# Patient Record
Sex: Female | Born: 1955 | Race: White | Hispanic: No | Smoking: Never smoker
Health system: Southern US, Community
[De-identification: ages and names within clinical notes are randomized; demographics above are authoritative.]

## PROBLEM LIST (undated history)

## (undated) ENCOUNTER — Emergency Department (HOSPITAL_BASED_OUTPATIENT_CLINIC_OR_DEPARTMENT_OTHER): Payer: No Typology Code available for payment source | Source: Home / Self Care

## (undated) DIAGNOSIS — Z91030 Bee allergy status: Secondary | ICD-10-CM

## (undated) DIAGNOSIS — M8580 Other specified disorders of bone density and structure, unspecified site: Secondary | ICD-10-CM

## (undated) DIAGNOSIS — I499 Cardiac arrhythmia, unspecified: Secondary | ICD-10-CM

## (undated) DIAGNOSIS — Z8042 Family history of malignant neoplasm of prostate: Secondary | ICD-10-CM

## (undated) DIAGNOSIS — J189 Pneumonia, unspecified organism: Secondary | ICD-10-CM

## (undated) DIAGNOSIS — R011 Cardiac murmur, unspecified: Secondary | ICD-10-CM

## (undated) DIAGNOSIS — Z8679 Personal history of other diseases of the circulatory system: Secondary | ICD-10-CM

## (undated) DIAGNOSIS — I4892 Unspecified atrial flutter: Secondary | ICD-10-CM

## (undated) DIAGNOSIS — K2270 Barrett's esophagus without dysplasia: Secondary | ICD-10-CM

## (undated) DIAGNOSIS — I350 Nonrheumatic aortic (valve) stenosis: Secondary | ICD-10-CM

## (undated) DIAGNOSIS — Z808 Family history of malignant neoplasm of other organs or systems: Secondary | ICD-10-CM

## (undated) DIAGNOSIS — R0600 Dyspnea, unspecified: Secondary | ICD-10-CM

## (undated) DIAGNOSIS — M179 Osteoarthritis of knee, unspecified: Secondary | ICD-10-CM

## (undated) DIAGNOSIS — Z801 Family history of malignant neoplasm of trachea, bronchus and lung: Secondary | ICD-10-CM

## (undated) DIAGNOSIS — IMO0002 Reserved for concepts with insufficient information to code with codable children: Secondary | ICD-10-CM

## (undated) DIAGNOSIS — Z8481 Family history of carrier of genetic disease: Secondary | ICD-10-CM

## (undated) DIAGNOSIS — Z800 Family history of malignant neoplasm of digestive organs: Secondary | ICD-10-CM

## (undated) DIAGNOSIS — E785 Hyperlipidemia, unspecified: Secondary | ICD-10-CM

## (undated) DIAGNOSIS — Z9889 Other specified postprocedural states: Secondary | ICD-10-CM

## (undated) DIAGNOSIS — K219 Gastro-esophageal reflux disease without esophagitis: Secondary | ICD-10-CM

## (undated) DIAGNOSIS — K529 Noninfective gastroenteritis and colitis, unspecified: Secondary | ICD-10-CM

## (undated) DIAGNOSIS — I10 Essential (primary) hypertension: Secondary | ICD-10-CM

## (undated) DIAGNOSIS — Z8041 Family history of malignant neoplasm of ovary: Secondary | ICD-10-CM

## (undated) DIAGNOSIS — K5792 Diverticulitis of intestine, part unspecified, without perforation or abscess without bleeding: Secondary | ICD-10-CM

## (undated) DIAGNOSIS — E669 Obesity, unspecified: Secondary | ICD-10-CM

## (undated) DIAGNOSIS — G43909 Migraine, unspecified, not intractable, without status migrainosus: Secondary | ICD-10-CM

## (undated) DIAGNOSIS — Z87898 Personal history of other specified conditions: Secondary | ICD-10-CM

## (undated) DIAGNOSIS — K449 Diaphragmatic hernia without obstruction or gangrene: Secondary | ICD-10-CM

## (undated) DIAGNOSIS — M1710 Unilateral primary osteoarthritis, unspecified knee: Secondary | ICD-10-CM

## (undated) HISTORY — DX: Other specified disorders of bone density and structure, unspecified site: M85.80

## (undated) HISTORY — DX: Hyperlipidemia, unspecified: E78.5

## (undated) HISTORY — PX: ABDOMINAL HYSTERECTOMY: SHX81

## (undated) HISTORY — PX: BREAST BIOPSY: SHX20

## (undated) HISTORY — DX: Osteoarthritis of knee, unspecified: M17.9

## (undated) HISTORY — PX: CARDIAC CATHETERIZATION: SHX172

## (undated) HISTORY — DX: Unilateral primary osteoarthritis, unspecified knee: M17.10

## (undated) HISTORY — PX: CHOLECYSTECTOMY: SHX55

## (undated) HISTORY — DX: Obesity, unspecified: E66.9

## (undated) HISTORY — DX: Migraine, unspecified, not intractable, without status migrainosus: G43.909

## (undated) HISTORY — DX: Nonrheumatic aortic (valve) stenosis: I35.0

## (undated) HISTORY — PX: OTHER SURGICAL HISTORY: SHX169

## (undated) HISTORY — DX: Personal history of other specified conditions: Z87.898

## (undated) HISTORY — DX: Bee allergy status: Z91.030

---
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
## 2006-10-07 DIAGNOSIS — M778 Other enthesopathies, not elsewhere classified: Secondary | ICD-10-CM

## 2006-10-07 HISTORY — DX: Other enthesopathies, not elsewhere classified: M77.8

## 2008-01-06 DIAGNOSIS — I4892 Unspecified atrial flutter: Secondary | ICD-10-CM | POA: Insufficient documentation

## 2008-01-06 HISTORY — DX: Unspecified atrial flutter: I48.92

## 2009-03-15 ENCOUNTER — Encounter: Admission: RE | Admit: 2009-03-15 | Discharge: 2009-03-15 | Payer: Self-pay | Admitting: Internal Medicine

## 2011-09-05 ENCOUNTER — Emergency Department (INDEPENDENT_AMBULATORY_CARE_PROVIDER_SITE_OTHER)
Admission: EM | Admit: 2011-09-05 | Discharge: 2011-09-05 | Disposition: A | Discharge status: Home / Self Care | Payer: Self-pay | Source: Home / Self Care

## 2011-09-05 ENCOUNTER — Encounter: Payer: Self-pay | Admitting: Emergency Medicine

## 2011-09-05 DIAGNOSIS — S39012A Strain of muscle, fascia and tendon of lower back, initial encounter: Secondary | ICD-10-CM

## 2011-09-05 DIAGNOSIS — S335XXA Sprain of ligaments of lumbar spine, initial encounter: Secondary | ICD-10-CM

## 2011-09-05 HISTORY — DX: Essential (primary) hypertension: I10

## 2011-09-05 HISTORY — DX: Diverticulitis of intestine, part unspecified, without perforation or abscess without bleeding: K57.92

## 2011-09-05 HISTORY — DX: Gastro-esophageal reflux disease without esophagitis: K21.9

## 2011-09-05 HISTORY — DX: Diaphragmatic hernia without obstruction or gangrene: K44.9

## 2011-09-05 HISTORY — DX: Reserved for concepts with insufficient information to code with codable children: IMO0002

## 2011-09-05 HISTORY — DX: Barrett's esophagus without dysplasia: K22.70

## 2011-09-05 HISTORY — DX: Noninfective gastroenteritis and colitis, unspecified: K52.9

## 2011-09-05 MED ORDER — METHOCARBAMOL 750 MG PO TABS: 750.0000 mg | ORAL_TABLET | Freq: Four times a day (QID) | ORAL | Status: AC

## 2011-09-05 MED ORDER — TRAMADOL HCL 50 MG PO TABS: 50.0000 mg | ORAL_TABLET | Freq: Four times a day (QID) | ORAL | Status: AC | PRN
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
## 2011-09-07 NOTE — ED Provider Notes (Signed)
Medical screening examination/treatment/procedure(s) were performed by non-physician practitioner and as supervising physician I was immediately available for consultation/collaboration.  LANEY,RONNIE  Ronnie Laney, MD 09/07/11 1315 

## 2011-10-31 ENCOUNTER — Ambulatory Visit: Payer: No Typology Code available for payment source | Attending: Internal Medicine | Admitting: Physical Therapy

## 2011-10-31 DIAGNOSIS — M545 Low back pain, unspecified: Secondary | ICD-10-CM | POA: Insufficient documentation

## 2011-10-31 DIAGNOSIS — IMO0001 Reserved for inherently not codable concepts without codable children: Secondary | ICD-10-CM | POA: Insufficient documentation

## 2011-11-04 ENCOUNTER — Ambulatory Visit: Payer: No Typology Code available for payment source | Admitting: Physical Therapy

## 2011-11-06 ENCOUNTER — Ambulatory Visit: Payer: No Typology Code available for payment source | Admitting: Physical Therapy

## 2011-11-11 ENCOUNTER — Ambulatory Visit: Payer: No Typology Code available for payment source | Attending: Internal Medicine | Admitting: Physical Therapy

## 2011-11-11 DIAGNOSIS — IMO0001 Reserved for inherently not codable concepts without codable children: Secondary | ICD-10-CM | POA: Insufficient documentation

## 2011-11-11 DIAGNOSIS — M545 Low back pain, unspecified: Secondary | ICD-10-CM | POA: Insufficient documentation

## 2011-11-12 ENCOUNTER — Encounter: Payer: Self-pay | Admitting: Physical Therapy

## 2011-11-13 ENCOUNTER — Ambulatory Visit: Payer: No Typology Code available for payment source | Admitting: Physical Therapy

## 2011-11-14 ENCOUNTER — Encounter: Payer: Self-pay | Admitting: Physical Therapy

## 2011-11-18 ENCOUNTER — Encounter: Payer: Self-pay | Admitting: Physical Therapy

## 2011-11-19 ENCOUNTER — Encounter: Payer: Self-pay | Admitting: Physical Therapy

## 2011-11-20 ENCOUNTER — Encounter: Payer: Self-pay | Admitting: Physical Therapy

## 2011-11-22 ENCOUNTER — Encounter: Payer: Self-pay | Admitting: Physical Therapy

## 2011-11-25 ENCOUNTER — Ambulatory Visit: Payer: No Typology Code available for payment source | Admitting: Physical Therapy

## 2011-11-26 ENCOUNTER — Encounter: Payer: Self-pay | Admitting: Physical Therapy

## 2011-11-27 ENCOUNTER — Ambulatory Visit: Payer: No Typology Code available for payment source | Admitting: Physical Therapy

## 2011-11-28 ENCOUNTER — Encounter: Payer: Self-pay | Admitting: Physical Therapy

## 2011-12-02 ENCOUNTER — Encounter: Payer: Self-pay | Admitting: Physical Therapy

## 2011-12-03 ENCOUNTER — Encounter: Payer: Self-pay | Admitting: Physical Therapy

## 2011-12-04 ENCOUNTER — Encounter: Payer: Self-pay | Admitting: Physical Therapy

## 2011-12-05 ENCOUNTER — Encounter: Payer: Self-pay | Admitting: Physical Therapy

## 2012-10-14 ENCOUNTER — Other Ambulatory Visit: Payer: Self-pay | Admitting: Gastroenterology

## 2012-11-03 ENCOUNTER — Encounter (HOSPITAL_COMMUNITY)
Admission: RE | Disposition: A | Discharge status: Home / Self Care | Payer: Self-pay | Source: Ambulatory Visit | Attending: Gastroenterology

## 2012-11-03 ENCOUNTER — Ambulatory Visit (HOSPITAL_COMMUNITY)
Admission: RE | Admit: 2012-11-03 | Discharge: 2012-11-03 | Disposition: A | Discharge status: Home / Self Care | Payer: 59 | Source: Ambulatory Visit | Attending: Gastroenterology | Admitting: Gastroenterology

## 2012-11-03 ENCOUNTER — Encounter (HOSPITAL_COMMUNITY): Payer: Self-pay

## 2012-11-03 DIAGNOSIS — Z1211 Encounter for screening for malignant neoplasm of colon: Secondary | ICD-10-CM | POA: Insufficient documentation

## 2012-11-03 DIAGNOSIS — Z79899 Other long term (current) drug therapy: Secondary | ICD-10-CM | POA: Insufficient documentation

## 2012-11-03 DIAGNOSIS — Z800 Family history of malignant neoplasm of digestive organs: Secondary | ICD-10-CM | POA: Insufficient documentation

## 2012-11-03 DIAGNOSIS — K219 Gastro-esophageal reflux disease without esophagitis: Secondary | ICD-10-CM | POA: Insufficient documentation

## 2012-11-03 DIAGNOSIS — I10 Essential (primary) hypertension: Secondary | ICD-10-CM | POA: Insufficient documentation

## 2012-11-03 DIAGNOSIS — E7800 Pure hypercholesterolemia, unspecified: Secondary | ICD-10-CM | POA: Insufficient documentation

## 2012-11-03 DIAGNOSIS — K209 Esophagitis, unspecified without bleeding: Secondary | ICD-10-CM | POA: Insufficient documentation

## 2012-11-03 DIAGNOSIS — M899 Disorder of bone, unspecified: Secondary | ICD-10-CM | POA: Insufficient documentation

## 2012-11-03 DIAGNOSIS — M949 Disorder of cartilage, unspecified: Secondary | ICD-10-CM | POA: Insufficient documentation

## 2012-11-03 HISTORY — DX: Other specified postprocedural states: Z98.890

## 2012-11-03 HISTORY — PX: ESOPHAGOGASTRODUODENOSCOPY: SHX5428

## 2012-11-03 HISTORY — PX: COLONOSCOPY: SHX5424

## 2012-11-03 HISTORY — DX: Personal history of other diseases of the circulatory system: Z86.79

## 2012-11-03 SURGERY — EGD (ESOPHAGOGASTRODUODENOSCOPY)
Anesthesia: Moderate Sedation

## 2012-11-03 MED ORDER — BUTAMBEN-TETRACAINE-BENZOCAINE 2-2-14 % EX AERO
INHALATION_SPRAY | CUTANEOUS | Status: DC | PRN
  Administered 2012-11-03: 2 via TOPICAL

## 2012-11-03 MED ORDER — MIDAZOLAM HCL 10 MG/2ML IJ SOLN
INTRAMUSCULAR | Status: AC
  Filled 2012-11-03: qty 4

## 2012-11-03 MED ORDER — FENTANYL CITRATE 0.05 MG/ML IJ SOLN
INTRAMUSCULAR | Status: DC | PRN
  Administered 2012-11-03 (×3): 25 ug via INTRAVENOUS
  Administered 2012-11-03: 50 ug via INTRAVENOUS

## 2012-11-03 MED ORDER — SODIUM CHLORIDE 0.9 % IV SOLN: INTRAVENOUS | Status: DC

## 2012-11-03 MED ORDER — DIPHENHYDRAMINE HCL 50 MG/ML IJ SOLN
INTRAMUSCULAR | Status: AC
  Filled 2012-11-03: qty 1

## 2012-11-03 MED ORDER — FENTANYL CITRATE 0.05 MG/ML IJ SOLN
INTRAMUSCULAR | Status: AC
  Filled 2012-11-03: qty 4

## 2012-11-03 MED ORDER — MIDAZOLAM HCL 10 MG/2ML IJ SOLN
INTRAMUSCULAR | Status: DC | PRN
  Administered 2012-11-03: 2.5 mg via INTRAVENOUS
  Administered 2012-11-03 (×2): 2 mg via INTRAVENOUS
  Administered 2012-11-03 (×2): 2.5 mg via INTRAVENOUS

## 2012-11-03 NOTE — Op Note (Signed)
Procedure: Screening colonoscopy  Indication: Sister diagnosed with Lynch syndrome. Normal screening colonoscopy in 2000.  Endoscopist Danise Edge  Premedication: Fentanyl 125 mcg. Versed 12.5 mg.  Procedure: The patient was placed in the left lateral decubitus position. Anal inspection and digital rectal exam were normal. The pediatric Pentax colonoscope was introduced into the rectum and with a great deal of difficulty due to colonic loop formation advanced to the cecum. A normal-appearing ileocecal valve and appendiceal orifice were identified. Colonic preparation for the exam today was good.  Rectum: Normal. Retroflexed view of the distal rectum normal.  Sigmoid colon and descending colon. Normal.  Splenic flexure. Normal.  Transverse colon. Normal.  Hepatic flexure. Normal.  Ascending colon. Normal.  Cecum and ileocecal valve. Normal.  Assessment: #1. Normal screening proctocolonoscopy to the cecum. #2. Technically a very difficult colonoscopy to perform secondary to colonic loop formation and previous surgery.  Recommendations: The patient should be scheduled a consultation with a genetic counselor at the colon cancer center to be screened for a Lynch syndrome. If she does not carry a gene for Lynch syndrome, she should undergo a repeat screening colonoscopy in 10 years. If she carries a gene for Lynch syndrome, she should undergo a screening colonoscopy every 1-2 years.  Procedure: Surveillance esophagogastroduodenoscopy with Barrett's mucosal biopsies.   Endoscopist: Danise Edge  Procedure: The patient was placed in the left lateral decubitus position. The Pentax gastroscope was passed through the posterior hypopharynx into the proximal esophagus without difficulty. The hypopharynx, larynx, and vocal cords appeared normal.  Esophagoscopy: The proximal, mid, and lower segments of the esophageal mucosa appeared normal. The squamocolumnar junction is regular in  appearance and noted at approximately 40 cm from the incisor teeth. There is no endoscopic evidence for the presence of erosive esophagitis, Barrett's esophagus, or esophageal stricture formation. Four-quadrant biopsies were performed at the esophagogastric junction.  Gastroscopy: Retroflex view of the gastric cardia and fundus was normal. The gastric body, antrum, and pylorus appeared normal.  Duodenoscopy: The duodenal bulb and descending duodenum appear normal.  Assessment: Normal esophagogastroduodenoscopy and without endoscopic evidence for the presence of Barrett's esophagus. Four-quadrant biopsies performed at the esophagogastric junction to look for Barrett's mucosa.

## 2012-11-03 NOTE — H&P (Signed)
   Problems: Barrett's esophagus. Screening colonoscopy. Sister has Lynch Syndrome.  History: The patient is a 57 year old female born 07-Apr-1956. On 05/31/1999, the patient underwent a normal screening colonoscopy except for the presence of mild proctitis. Random colon biopsies showed microscopic colitis. Colorectal neoplasia was not present.  The patient's sister was diagnosed with Lynch syndrome and required a subtotal colectomy to treat recurrent colon cancer.  The patient has not been screened for Lynch syndrome.  The patient has a history of Barrett's esophagus.  The patient is scheduled to undergo a repeat screening colonoscopy and surveillance esophagogastroduodenoscopy with Barrett's mucosal biopsies.  Medication allergies: Mobic. Penicillin. Codeine.  Chronic medications: Atenolol. Omeprazole. Diovan HCT. Imitrex.  Past medical and surgical history: Two cesarean sections. Total abdominal hysterectomy. Bilateral salpingo-oophorectomy. Cholecystectomy. Cardiac ablation for atrial flutter. Hypertension. Gastroesophageal reflux complicated by Barrett's esophagus. Osteoarthritis. Migraine headaches. Atrial flutter. Hypercholesterolemia. Bee sting allergy. Osteopenia. Microscopic colitis.  Family history: Sister has Lynch syndrome.  Exam: The patient is alert and lying comfortably on the endoscopy stretcher. Lungs are clear to auscultation. Cardiac exam reveals a regular rhythm. Abdomen is soft, flat, and nontender to palpation in all quadrants.  Plan: Proceed with repeat screening colonoscopy and surveillance esophagogastroduodenoscopy with Barrett's mucosal biopsies.

## 2012-11-04 ENCOUNTER — Encounter (HOSPITAL_COMMUNITY): Payer: Self-pay | Admitting: Gastroenterology

## 2012-11-11 ENCOUNTER — Telehealth: Payer: Self-pay | Admitting: Genetic Counselor

## 2012-11-11 NOTE — Telephone Encounter (Signed)
S/W PT IN REF TO GENETIC COUNS.01/14/13@9 :00 REFERRING DR Danise Edge MAILED NP PACKET

## 2013-01-14 ENCOUNTER — Other Ambulatory Visit: Payer: 59 | Admitting: Lab

## 2013-01-14 ENCOUNTER — Ambulatory Visit (HOSPITAL_BASED_OUTPATIENT_CLINIC_OR_DEPARTMENT_OTHER): Payer: 59 | Admitting: Genetic Counselor

## 2013-01-14 ENCOUNTER — Encounter: Payer: Self-pay | Admitting: Genetic Counselor

## 2013-01-14 DIAGNOSIS — Z800 Family history of malignant neoplasm of digestive organs: Secondary | ICD-10-CM

## 2013-01-14 NOTE — Progress Notes (Signed)
Dr. Danise Edge requested a consultation for genetic counseling and risk assessment for Rebecca Farley, a 57 y.o. female, for discussion of her family history of a PMS2 mutation resulting in Lynch Syndrome. She presents to clinic today to discuss the possibility of a genetic predisposition to cancer, and to further clarify her risks, as well as her family members' risks for cancer.   HISTORY OF PRESENT ILLNESS: Rebecca Farley is a 57 y.o. female with no personal history of cancer.    Past Medical History  Diagnosis Date  . Hypertension   . Ulcer   . Colitis   . Diverticulitis   . Barrett esophagus   . Hiatal hernia   . GERD (gastroesophageal reflux disease)   . S/P RF ablation operation for arrhythmia     Past Surgical History  Procedure Laterality Date  . Cesarean section    . Abdominal hysterectomy    . Cholecystectomy    . Implanted loop device    . Esophagogastroduodenoscopy  11/03/2012    Procedure: ESOPHAGOGASTRODUODENOSCOPY (EGD);  Surgeon: Charolett Bumpers, MD;  Location: [facility];  Service: Endoscopy;  Laterality: N/A;  . Colonoscopy  11/03/2012    Procedure: COLONOSCOPY;  Surgeon: Charolett Bumpers, MD;  Location: [facility];  Service: Endoscopy;  Laterality: N/A;    History  Substance Use Topics  . Smoking status: Never Smoker   . Smokeless tobacco: Not on file  . 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  Significant diagnoses are listed below: Family History  Problem Relation Age of Onset  . Hypertension Mother   . Heart failure Mother   . Aneurysm Mother   . Kidney failure Mother   . Cancer Father   . Emphysema Father   . Cancer Other   . Colon cancer Sister 22    dx 3 times;  Lynch syndrome  The patient's sister was diagnsoed with colon cancer three times.  She ultimately was tested and found to have Lynch syndrome as the result of a PMS2 mutation.  The patient has 8 other brothers and sisters.  One sister has tested positive for a PMS2 mutation.  The patient's brother was diagnosed with lung cancer and recently died.  She has a maternal uncle who possibly had kidney cancer and a paternal uncle with an unknown cancer.  Patient's maternal ancestors are of [place] and [place] descent, and paternal ancestors are of [place] and [place] descent. There is no reported Ashkenazi Jewish ancestry. There is no  known consanguinity.  GENETIC COUNSELING RISK ASSESSMENT, DISCUSSION, AND SUGGESTED FOLLOW UP: We reviewed the natural history and genetic etiology of sporadic, familial and hereditary cancer syndromes. About 3% of colon cancer is caused by Lynch syndrome.  Based on her family history of a PMS2 mutation, the patient has a 50% chance of testing positive.  We discussed that if she is positive we can change her medical management in regards to how frequently she receives colonoscopies.    The patient's family history is suggestive of the following possible diagnosis: Lynch Syndrome  We discussed that identification of a hereditary cancer syndrome may help her care providers tailor the patients medical management. If a mutation indicating Lynch Syndrome is detected in this case, the [facility] recommendations would include increased frequency of colonoscopy. If a mutation is detected, the patient will be referred back to the referring provider and to any additional appropriate care providers to discuss the relevant options.   If a mutation is not found in the patient,cancer surveillance options would be discussed for the patient according to the appropriate standard National Comprehensive Cancer Network and American Cancer Society guidelines, with consideration  of their personal and family history risk factors. In this case, the patient will be referred back to their care providers for discussions of management.   After considering the risks, benefits, and limitations, the patient provided informed consent for  the following  testing: Single Site Testing for PMS2 through [facility].   Per the patient's request, we will contact her by telephone to discuss these results. A follow up genetic counseling visit will be scheduled if indicated.  The patient was seen for a total of 60 minutes, greater than 50% of which was spent face-to-face counseling.  This plan is being carried out per Dr. Danise Edge recommendations.  This note will also be sent to the referring provider via the electronic medical record. The patient will be supplied with a summary of this genetic counseling discussion as well as educational information on the discussed hereditary cancer syndromes following the conclusion of their visit.   Patient was discussed with Dr. Drue Second.   _______________________________________________________________________ For Office Staff:  Number of people involved in session: 1 Was an Intern/ student involved with case: no

## 2013-01-29 ENCOUNTER — Telehealth: Payer: Self-pay | Admitting: Genetic Counselor

## 2013-01-29 NOTE — Telephone Encounter (Signed)
Revealed negative results for the PMS2 mutation in her family.  Her children do not need to test for this.

## 2013-02-05 ENCOUNTER — Encounter: Payer: Self-pay | Admitting: Genetic Counselor

## 2013-02-10 ENCOUNTER — Encounter: Payer: Self-pay | Admitting: Genetic Counselor

## 2013-07-05 ENCOUNTER — Other Ambulatory Visit: Payer: Self-pay

## 2013-07-05 DIAGNOSIS — Z1231 Encounter for screening mammogram for malignant neoplasm of breast: Secondary | ICD-10-CM

## 2013-07-23 ENCOUNTER — Ambulatory Visit
Admission: RE | Admit: 2013-07-23 | Discharge: 2013-07-23 | Disposition: A | Discharge status: Home / Self Care | Payer: 59 | Source: Ambulatory Visit

## 2013-07-23 DIAGNOSIS — Z1231 Encounter for screening mammogram for malignant neoplasm of breast: Secondary | ICD-10-CM

## 2013-07-26 ENCOUNTER — Other Ambulatory Visit: Payer: Self-pay | Admitting: Gastroenterology

## 2013-07-26 DIAGNOSIS — R1310 Dysphagia, unspecified: Secondary | ICD-10-CM

## 2013-08-05 ENCOUNTER — Other Ambulatory Visit: Payer: 59

## 2013-08-16 ENCOUNTER — Ambulatory Visit
Admission: RE | Admit: 2013-08-16 | Discharge: 2013-08-16 | Disposition: A | Discharge status: Home / Self Care | Payer: 59 | Source: Ambulatory Visit | Attending: Gastroenterology | Admitting: Gastroenterology

## 2013-08-16 DIAGNOSIS — R1310 Dysphagia, unspecified: Secondary | ICD-10-CM

## 2013-10-28 ENCOUNTER — Other Ambulatory Visit: Payer: Self-pay | Admitting: Gastroenterology

## 2013-11-09 ENCOUNTER — Encounter (HOSPITAL_COMMUNITY): Admission: RE | Payer: Self-pay | Source: Ambulatory Visit

## 2013-11-09 ENCOUNTER — Ambulatory Visit (HOSPITAL_COMMUNITY): Admission: RE | Admit: 2013-11-09 | Payer: 59 | Source: Ambulatory Visit | Admitting: Gastroenterology

## 2013-11-09 SURGERY — ESOPHAGOGASTRODUODENOSCOPY (EGD) WITH ESOPHAGEAL DILATION
Anesthesia: Moderate Sedation

## 2014-06-27 ENCOUNTER — Other Ambulatory Visit: Payer: Self-pay

## 2014-06-27 DIAGNOSIS — Z1231 Encounter for screening mammogram for malignant neoplasm of breast: Secondary | ICD-10-CM

## 2014-07-25 ENCOUNTER — Ambulatory Visit: Payer: 59

## 2014-12-26 ENCOUNTER — Ambulatory Visit (HOSPITAL_COMMUNITY): Payer: 59 | Attending: Cardiology

## 2014-12-26 ENCOUNTER — Other Ambulatory Visit (HOSPITAL_COMMUNITY): Payer: Self-pay | Admitting: Internal Medicine

## 2014-12-26 DIAGNOSIS — R011 Cardiac murmur, unspecified: Secondary | ICD-10-CM | POA: Insufficient documentation

## 2014-12-26 NOTE — Progress Notes (Signed)
2D Echo completed. 12/26/2014 

## 2015-06-02 ENCOUNTER — Other Ambulatory Visit: Payer: Self-pay

## 2015-06-02 DIAGNOSIS — Z1231 Encounter for screening mammogram for malignant neoplasm of breast: Secondary | ICD-10-CM

## 2015-07-28 ENCOUNTER — Ambulatory Visit: Payer: 59

## 2015-08-25 ENCOUNTER — Ambulatory Visit
Admission: RE | Admit: 2015-08-25 | Discharge: 2015-08-25 | Disposition: A | Discharge status: Home / Self Care | Payer: 59 | Source: Ambulatory Visit

## 2015-08-25 DIAGNOSIS — Z1231 Encounter for screening mammogram for malignant neoplasm of breast: Secondary | ICD-10-CM

## 2015-10-19 MED FILL — VALSARTAN-HCTZ 320-12.5 MG: 320-12.5 | 90 days supply | Qty: 90 | Fill #2

## 2015-10-19 MED FILL — ATENOLOL 50 MG TABLET: 50 | 90 days supply | Qty: 90 | Fill #2

## 2015-11-28 DIAGNOSIS — K644 Residual hemorrhoidal skin tags: Secondary | ICD-10-CM | POA: Diagnosis not present

## 2016-01-02 DIAGNOSIS — I10 Essential (primary) hypertension: Secondary | ICD-10-CM | POA: Diagnosis not present

## 2016-01-02 DIAGNOSIS — E669 Obesity, unspecified: Secondary | ICD-10-CM | POA: Diagnosis not present

## 2016-01-02 DIAGNOSIS — Z6829 Body mass index (BMI) 29.0-29.9, adult: Secondary | ICD-10-CM | POA: Diagnosis not present

## 2016-01-02 DIAGNOSIS — Z0000 Encounter for general adult medical examination without abnormal findings: Secondary | ICD-10-CM | POA: Diagnosis not present

## 2016-01-02 DIAGNOSIS — G43909 Migraine, unspecified, not intractable, without status migrainosus: Secondary | ICD-10-CM | POA: Diagnosis not present

## 2016-01-04 DIAGNOSIS — H04123 Dry eye syndrome of bilateral lacrimal glands: Secondary | ICD-10-CM | POA: Diagnosis not present

## 2016-01-04 DIAGNOSIS — H2513 Age-related nuclear cataract, bilateral: Secondary | ICD-10-CM | POA: Diagnosis not present

## 2016-02-04 MED FILL — VALSARTAN-HCTZ 320-12.5 MG: 320-12.5 | 90 days supply | Qty: 90 | Fill #3

## 2016-02-05 MED FILL — ATENOLOL 50 MG TABLET: 50 | 90 days supply | Qty: 90 | Fill #0

## 2016-03-11 DIAGNOSIS — Z79899 Other long term (current) drug therapy: Secondary | ICD-10-CM | POA: Diagnosis not present

## 2016-03-11 DIAGNOSIS — R51 Headache: Secondary | ICD-10-CM | POA: Diagnosis not present

## 2016-03-11 DIAGNOSIS — Z049 Encounter for examination and observation for unspecified reason: Secondary | ICD-10-CM | POA: Diagnosis not present

## 2016-03-11 DIAGNOSIS — G43719 Chronic migraine without aura, intractable, without status migrainosus: Secondary | ICD-10-CM | POA: Diagnosis not present

## 2016-03-11 MED FILL — BACLOFEN 10 MG TABLET: 10 | 35 days supply | Qty: 20 | Fill #0

## 2016-03-11 MED FILL — IMIPRAMINE HCL 25 MG TABLET: 25 | 30 days supply | Qty: 60 | Fill #0

## 2016-04-28 ENCOUNTER — Encounter (HOSPITAL_COMMUNITY): Payer: Self-pay

## 2016-04-28 ENCOUNTER — Emergency Department (HOSPITAL_COMMUNITY)
Admission: EM | Admit: 2016-04-28 | Discharge: 2016-04-28 | Disposition: A | Discharge status: Home / Self Care | Payer: 59 | Attending: Emergency Medicine | Admitting: Emergency Medicine

## 2016-04-28 DIAGNOSIS — L0291 Cutaneous abscess, unspecified: Secondary | ICD-10-CM

## 2016-04-28 DIAGNOSIS — I10 Essential (primary) hypertension: Secondary | ICD-10-CM | POA: Diagnosis not present

## 2016-04-28 DIAGNOSIS — L0231 Cutaneous abscess of buttock: Secondary | ICD-10-CM | POA: Insufficient documentation

## 2016-04-28 DIAGNOSIS — L0390 Cellulitis, unspecified: Secondary | ICD-10-CM

## 2016-04-28 DIAGNOSIS — Z79899 Other long term (current) drug therapy: Secondary | ICD-10-CM | POA: Insufficient documentation

## 2016-04-28 DIAGNOSIS — L03317 Cellulitis of buttock: Secondary | ICD-10-CM | POA: Insufficient documentation

## 2016-04-28 DIAGNOSIS — T814XXA Infection following a procedure, initial encounter: Secondary | ICD-10-CM | POA: Diagnosis not present

## 2016-04-28 HISTORY — DX: Cardiac murmur, unspecified: R01.1

## 2016-04-28 MED ORDER — LIDOCAINE HCL (PF) 1 % IJ SOLN
2.0000 mL | Freq: Once | INTRAMUSCULAR | Status: AC
  Administered 2016-04-28: 2 mL
  Filled 2016-04-28: qty 30

## 2016-04-28 MED ORDER — SULFAMETHOXAZOLE-TRIMETHOPRIM 800-160 MG PO TABS: 1.0000 | ORAL_TABLET | Freq: Two times a day (BID) | ORAL | 0 refills | Status: AC

## 2016-04-28 MED ORDER — HYDROCODONE-ACETAMINOPHEN 5-325 MG PO TABS: 2.0000 | ORAL_TABLET | ORAL | 0 refills | Status: DC | PRN

## 2016-04-28 MED ORDER — SULFAMETHOXAZOLE-TRIMETHOPRIM 800-160 MG PO TABS
1.0000 | ORAL_TABLET | Freq: Once | ORAL | Status: AC
  Administered 2016-04-28: 1 via ORAL
  Filled 2016-04-28: qty 1

## 2016-04-28 MED ORDER — MIDAZOLAM HCL 2 MG/2ML IJ SOLN
1.0000 mg | Freq: Once | INTRAMUSCULAR | Status: AC
  Administered 2016-04-28: 1 mg via INTRAVENOUS
  Filled 2016-04-28: qty 2

## 2016-04-28 MED ORDER — ONDANSETRON 4 MG PO TBDP
4.0000 mg | ORAL_TABLET | Freq: Once | ORAL | Status: DC
  Filled 2016-04-28: qty 1

## 2016-04-28 MED ORDER — SODIUM CHLORIDE 0.9 % IV SOLN
Freq: Once | INTRAVENOUS | Status: AC
  Administered 2016-04-28: 21:00:00 via INTRAVENOUS

## 2016-04-28 MED ORDER — HYDROMORPHONE HCL 1 MG/ML IJ SOLN
1.0000 mg | Freq: Once | INTRAMUSCULAR | Status: AC
  Administered 2016-04-28: 1 mg via INTRAVENOUS
  Filled 2016-04-28: qty 1

## 2016-04-28 NOTE — ED Triage Notes (Signed)
Pt c/o increasing abscess on R buttocks x 3 days.  Pain score 9/10.  Pt reports abscess started draining this morning.

## 2016-04-28 NOTE — ED Provider Notes (Signed)
[facility]-EMERGENCY DEPT Provider Note  CSN: [id number] [id number]& time: 04/28/16  1729  First Provider Contact:  8:30 PM   By signing my name below, I, Vista Mink, attest that this documentation has been prepared under the direction and in the presence of Earley Favor NP.   Electronically Signed: Vista Mink, ED Scribe. 04/28/16. 8:44 PM.   History   Chief Complaint Chief Complaint  Patient presents with  . Abscess   HPI Comments: Rebecca Farley is a 60 y.o. female who presents to the Emergency Department complaining of a gradually worsening, painful erythematous area of induration to her right buttocks that started four days ago. Pt states she noticed the area becoming red four days ago and noticed a bump forming the day after. Pt also states she noticed blood oozing from the area yesterday. [facility] was told to be come to the ED. Pt states she has not taken any OTC medication for pain. Pt denies any Hx of similar abscesses. Pt denies any fever.    The history is provided by the patient. No language interpreter was used.    Past Medical History:  Diagnosis Date  . Barrett esophagus   . Colitis   . Diverticulitis   . GERD (gastroesophageal reflux disease)   . Heart murmur   . Hiatal hernia   . Hypertension   . S/P RF ablation operation for arrhythmia   . Ulcer     There are no active problems to display for this patient.   Past Surgical History:  Procedure Laterality Date  . ABDOMINAL HYSTERECTOMY    . CESAREAN SECTION    . CHOLECYSTECTOMY    . COLONOSCOPY  11/03/2012   Procedure: COLONOSCOPY;  Surgeon: Charolett Bumpers, MD;  Location: [facility];  Service: Endoscopy;  Laterality: N/A;  . ESOPHAGOGASTRODUODENOSCOPY  11/03/2012   Procedure: ESOPHAGOGASTRODUODENOSCOPY (EGD);  Surgeon: Charolett Bumpers, MD;  Location: [facility];  Service: Endoscopy;  Laterality: N/A;  . implanted loop device      OB History    No data available        Home Medications    Prior to Admission medications   Medication Sig Start Date End Date Taking? Authorizing Provider  baclofen (LIORESAL) 10 MG tablet Take 10 mg by mouth 2 (two) times daily as needed (for migraines).   Yes Historical Provider, MD  imipramine (TOFRANIL) 25 MG tablet Take 25 mg by mouth at bedtime.    Yes Historical Provider, MD  omeprazole (PRILOSEC) 20 MG capsule Take 20 mg by mouth at bedtime.    Yes Historical Provider, MD  valsartan-hydrochlorothiazide (DIOVAN-HCT) 320-12.5 MG per tablet Take 1 tablet by mouth daily.     Yes Historical Provider, MD    Family History Family History  Problem Relation Age of Onset  . Hypertension Mother   . Heart failure Mother   . Aneurysm Mother   . Kidney failure Mother   . Cancer Father   . Emphysema Father   . Colon cancer Sister 69    dx 3 times; Lynch syndrome  . Cancer Other     Social History Social History  Substance Use Topics  . Smoking status: Never Smoker  . Smokeless tobacco: Never Used  . Alcohol use No     Allergies   Penicillins and Nsaids   Review of Systems Review of Systems  Constitutional: Negative for fever.  Skin: Positive for wound (right buttocks).  All other systems reviewed  and are negative.    Physical Exam Updated Vital Signs BP 118/94 (BP Location: Right Arm)   Pulse 111   Temp 98.6 F (37 C) (Oral)   Resp 18   SpO2 100%   Physical Exam  Constitutional: She is oriented to person, place, and time. She appears well-developed and well-nourished. No distress.  HENT:  Head: Normocephalic and atraumatic.  Neck: Normal range of motion.  Pulmonary/Chest: Effort normal.  Neurological: She is alert and oriented to person, place, and time.  Skin: Skin is warm and dry. She is not diaphoretic. There is erythema.  10cm by 8cm red indurated area on the medial right buttock with a 1&1/2cm pointing, draining central lesion.  Psychiatric: She has a normal mood and affect. Judgment  normal.  Nursing note and vitals reviewed.    ED Treatments / Results  Labs (all labs ordered are listed, but only abnormal results are displayed) Labs Reviewed - No data to display  EKG  EKG Interpretation None       Radiology No results found.  Procedures .Marland KitchenIncision and Drainage Date/Time: 04/28/2016 8:49 PM Performed by: Earley Favor Authorized by: Pricilla Loveless   Consent:    Consent obtained:  Verbal   Consent given by:  Patient   Risks discussed:  Bleeding, pain and incomplete drainage   Alternatives discussed:  No treatment Location:    Type:  Abscess   Size:  10 x10   Location:  Anogenital   Anogenital location:  Gluteal cleft Pre-procedure details:    Skin preparation:  Betadine Anesthesia (see MAR for exact dosages):    Anesthesia method:  Local infiltration   Local anesthetic:  Lidocaine 1% w/o epi Procedure type:    Complexity:  Simple Procedure details:    Needle aspiration: no     Incision types:  Single straight   Incision depth:  Dermal   Scalpel blade:  11   Wound management:  Probed and deloculated   Drainage:  Purulent   Drainage amount:  Copious   Wound treatment:  Wound left open   Packing materials:  1/2 in gauze   Amount 1/2":  6 inches Post-procedure details:    Patient tolerance of procedure:  Tolerated well, no immediate complications     DIAGNOSTIC STUDIES: Oxygen Saturation is 100% on RA, normal by my interpretation.  COORDINATION OF CARE: 8:32 PM-Will drain abscess. Discussed treatment plan with pt at bedside and pt agreed to plan.    Medications Ordered in ED Medications  HYDROmorphone (DILAUDID) injection 1 mg (not administered)  midazolam (VERSED) injection 1 mg (not administered)  lidocaine (PF) (XYLOCAINE) 1 % injection 2 mL (not administered)  0.9 %  sodium chloride infusion ( Intravenous New Bag/Given 04/28/16 2048)     Initial Impression / Assessment and Plan / ED Course  I have reviewed the triage vital  signs and the nursing notes.  Pertinent labs & imaging results that were available during my care of the patient were reviewed by me and considered in my medical decision making (see chart for details).  Clinical Course    I personally performed the services described in this documentation, which was scribed in my presence. The recorded information has been reviewed and is accurate.  Final Clinical Impressions(s) / ED Diagnoses   Final diagnoses:  None    New Prescriptions New Prescriptions   No medications on file     Earley Favor, NP 04/28/16 2126    Earley Favor, NP 04/28/16 2136    Lorin Picket  Criss Alvine, MD 05/02/16 [phone]

## 2016-04-28 NOTE — Discharge Instructions (Signed)
Apply warm compress several times daily for the next 2-3 days Call and make an appointment with DR. Valentina Lucks for a wound check and to remove the packing. You can expect significant drainage and will need to change the dressing frequently

## 2016-04-29 MED FILL — HYDROCODON-APAP 5-325: 5-325 | 1 days supply | Qty: 10 | Fill #0

## 2016-04-29 MED FILL — SULFAMETHOXAZOLE/TMP DS TAB: 800-160 | 7 days supply | Qty: 14 | Fill #0

## 2016-04-30 DIAGNOSIS — L0231 Cutaneous abscess of buttock: Secondary | ICD-10-CM | POA: Diagnosis not present

## 2016-05-03 DIAGNOSIS — L0231 Cutaneous abscess of buttock: Secondary | ICD-10-CM | POA: Diagnosis not present

## 2016-06-02 MED FILL — IMIPRAMINE HCL 25 MG TABLET: 25 | 30 days supply | Qty: 60 | Fill #1

## 2016-06-03 MED FILL — VALSARTAN-HCTZ 320-12.5 MG: 320-12.5 | 90 days supply | Qty: 90 | Fill #0

## 2016-07-22 ENCOUNTER — Other Ambulatory Visit: Payer: Self-pay | Admitting: Internal Medicine

## 2016-07-22 DIAGNOSIS — Z1231 Encounter for screening mammogram for malignant neoplasm of breast: Secondary | ICD-10-CM

## 2016-07-22 MED FILL — IMIPRAMINE HCL 25 MG TABLET: 25 | 30 days supply | Qty: 30 | Fill #0

## 2016-07-24 MED FILL — HYDROCODON-APAP 5-325: 5-325 | 2 days supply | Qty: 20 | Fill #0

## 2016-07-30 DIAGNOSIS — I10 Essential (primary) hypertension: Secondary | ICD-10-CM | POA: Diagnosis not present

## 2016-07-30 DIAGNOSIS — R42 Dizziness and giddiness: Secondary | ICD-10-CM | POA: Diagnosis not present

## 2016-07-30 MED FILL — VALSARTAN-HCTZ 160-12.5 MG: 160-12.5 | 30 days supply | Qty: 30 | Fill #0

## 2016-08-26 ENCOUNTER — Ambulatory Visit: Payer: 59

## 2016-09-05 MED FILL — IMIPRAMINE HCL 25 MG TABLET: 25 | 30 days supply | Qty: 30 | Fill #1

## 2016-09-05 MED FILL — VALSARTAN-HCTZ 160-12.5 MG: 160-12.5 | 30 days supply | Qty: 30 | Fill #1

## 2016-10-10 MED FILL — IMIPRAMINE HCL 25 MG TABLET: 25 | 30 days supply | Qty: 30 | Fill #2

## 2016-10-10 MED FILL — VALSARTAN-HCTZ 160-12.5 MG: 160-12.5 | 30 days supply | Qty: 30 | Fill #2

## 2016-10-18 DIAGNOSIS — I10 Essential (primary) hypertension: Secondary | ICD-10-CM | POA: Diagnosis not present

## 2016-11-08 MED FILL — IMIPRAMINE HCL 25 MG TAB: 25 | 30 days supply | Qty: 30 | Fill #3

## 2016-11-08 MED FILL — VALSARTAN-HCTZ 160-12.5 MG: 160-12.5 | 30 days supply | Qty: 30 | Fill #3

## 2016-11-12 ENCOUNTER — Ambulatory Visit
Admission: RE | Admit: 2016-11-12 | Discharge: 2016-11-12 | Disposition: A | Discharge status: Home / Self Care | Payer: 59 | Source: Ambulatory Visit | Attending: Internal Medicine | Admitting: Internal Medicine

## 2016-11-12 DIAGNOSIS — Z1231 Encounter for screening mammogram for malignant neoplasm of breast: Secondary | ICD-10-CM

## 2016-12-13 MED FILL — VALSARTAN-HCTZ 160-12.5 MG: 160-12.5 | 30 days supply | Qty: 30 | Fill #4

## 2016-12-13 MED FILL — IMIPRAMINE HCL 25 MG TAB: 25 | 30 days supply | Qty: 30 | Fill #4

## 2017-01-14 DIAGNOSIS — H43812 Vitreous degeneration, left eye: Secondary | ICD-10-CM | POA: Diagnosis not present

## 2017-01-14 DIAGNOSIS — H04123 Dry eye syndrome of bilateral lacrimal glands: Secondary | ICD-10-CM | POA: Diagnosis not present

## 2017-01-14 DIAGNOSIS — H524 Presbyopia: Secondary | ICD-10-CM | POA: Diagnosis not present

## 2017-01-14 DIAGNOSIS — H2513 Age-related nuclear cataract, bilateral: Secondary | ICD-10-CM | POA: Diagnosis not present

## 2017-01-23 MED FILL — BACLOFEN 10 MG TABLET: 10 | 20 days supply | Qty: 20 | Fill #0

## 2017-01-23 MED FILL — OMEPRAZOLE 20 MG CAPSULE DR: 20 | 90 days supply | Qty: 90 | Fill #0

## 2017-01-23 MED FILL — VALSARTAN-HCTZ 160-12.5 MG: 160-12.5 | 90 days supply | Qty: 90 | Fill #0

## 2017-01-28 ENCOUNTER — Emergency Department (HOSPITAL_COMMUNITY): Payer: 59

## 2017-01-28 ENCOUNTER — Encounter (HOSPITAL_COMMUNITY): Payer: Self-pay | Admitting: Emergency Medicine

## 2017-01-28 ENCOUNTER — Emergency Department (HOSPITAL_COMMUNITY)
Admission: EM | Admit: 2017-01-28 | Discharge: 2017-01-28 | Disposition: A | Discharge status: Home / Self Care | Payer: 59 | Attending: Emergency Medicine | Admitting: Emergency Medicine

## 2017-01-28 DIAGNOSIS — Z79899 Other long term (current) drug therapy: Secondary | ICD-10-CM | POA: Insufficient documentation

## 2017-01-28 DIAGNOSIS — I10 Essential (primary) hypertension: Secondary | ICD-10-CM | POA: Diagnosis not present

## 2017-01-28 DIAGNOSIS — R079 Chest pain, unspecified: Secondary | ICD-10-CM | POA: Diagnosis not present

## 2017-01-28 DIAGNOSIS — R0789 Other chest pain: Secondary | ICD-10-CM | POA: Insufficient documentation

## 2017-01-28 LAB — BASIC METABOLIC PANEL
ANION GAP: 10 (ref 5–15)
BUN: 17 mg/dL (ref 6–20)
CO2: 28 mmol/L (ref 22–32)
Calcium: 9.5 mg/dL (ref 8.9–10.3)
Chloride: 100 mmol/L — ABNORMAL LOW (ref 101–111)
Creatinine, Ser: 0.83 mg/dL (ref 0.44–1.00)
GFR calc Af Amer: >60 mL/min (ref >60)
GFR calc non Af Amer: >60 mL/min (ref >60)
GLUCOSE: 112 mg/dL — AB (ref 65–99)
Potassium: 3.5 mmol/L (ref 3.5–5.1)
Sodium: 138 mmol/L (ref 135–145)

## 2017-01-28 LAB — CBC
HEMATOCRIT: 38.3 % (ref 36.0–46.0)
HEMOGLOBIN: 12.6 g/dL (ref 12.0–15.0)
MCH: 29.2 pg (ref 26.0–34.0)
MCHC: 32.9 g/dL (ref 30.0–36.0)
MCV: 88.9 fL (ref 78.0–100.0)
Platelets: 230 10*3/uL (ref 150–400)
RBC: 4.31 MIL/uL (ref 3.87–5.11)
RDW: 14 % (ref 11.5–15.5)
WBC: 7.5 10*3/uL (ref 4.0–10.5)

## 2017-01-28 LAB — I-STAT TROPONIN, ED
Troponin i, poc: 0 ng/mL (ref 0.00–0.08)
Troponin i, poc: 0 ng/mL (ref 0.00–0.08)

## 2017-01-28 NOTE — ED Triage Notes (Addendum)
To ED via [facility] from work after hacving a sharp stabbing pain under right breast-- became dizzy, diaphoretic, initial pain 7/10, now 1/10--  Pt not having pain at all on arrival to ED, has hx of ablation and loop recorder but no cardiologist here.

## 2017-01-28 NOTE — ED Provider Notes (Signed)
Care of patient transferred from [facility] at 1600. Patient is 61 year old female with history of hypertension who presents in setting of chest pain. Chest pain started at rest at work and resolved without intervention prior to coming to emergency department. Workup thus far has been unremarkable including negative initial troponin. Patient is low risk for heart score. And to repeat troponin is not elevated patient be discharged home with plan to follow up PCP for further management of this condition.  Repeat troponin undetectable. Patient continues to be pain-free on reassessment. Patient stable for discharge home.   Stacy Gardner, MD 01/28/17 8295    Linwood Dibbles, MD 01/30/17 1001

## 2017-01-28 NOTE — ED Provider Notes (Signed)
[facility]-EM[facility]ENCY DEPT Provider Note   CSN: [id number] Arrival date & time: 01/28/17  1220     History   Chief Complaint Chief Complaint  Patient presents with  . Chest Pain    HPI Rebecca Farley is a 61 y.o. female.  HPI   Pt is a 61 yo female with PMH of HTN, GERD Who presents to the ED via EMS with complaint of chest pain, onset 11:30 AM. Patient reports when she was sitting at her desk at work she began to have sudden onset sharp stabbing pains under her right breast with associated lightheadedness, diaphoresis and nausea. Denies radiation. Denies any aggravating or alleviating factors. Patient reports upon arrival to the ED her chest pain and nausea have resolved and she currently only feels slightly lightheaded. Denies having history of similar episodes in the past. Patient denies taking any medications prior to arrival. Denies fever, cough, SOB, wheezing, palpitations, abdominal pain, vomiting, numbness, weakness, syncope. Denies personal history of cardiac disease but reports having a prior cardiac ablation performed "years ago". Denies family history of cardiac disease. Denies smoking.   PCP- Dr. Valentina Lucks  Past Medical History:  Diagnosis Date  . Barrett esophagus   . Colitis   . Diverticulitis   . GERD (gastroesophageal reflux disease)   . Heart murmur   . Hiatal hernia   . Hypertension   . S/P RF ablation operation for arrhythmia   . Ulcer     There are no active problems to display for this patient.   Past Surgical History:  Procedure Laterality Date  . ABDOMINAL HYSTERECTOMY    . BREAST BIOPSY    . CESAREAN SECTION    . CHOLECYSTECTOMY    . COLONOSCOPY  11/03/2012   Procedure: COLONOSCOPY;  Surgeon: Charolett Bumpers, MD;  Location: [facility];  Service: Endoscopy;  Laterality: N/A;  . ESOPHAGOGASTRODUODENOSCOPY  11/03/2012   Procedure: ESOPHAGOGASTRODUODENOSCOPY (EGD);  Surgeon: Charolett Bumpers, MD;  Location: [facility];  Service: Endoscopy;  Laterality:  N/A;  . implanted loop device      OB History    No data available       Home Medications    Prior to Admission medications   Medication Sig Start Date End Date Taking? Authorizing Provider  baclofen (LIORESAL) 10 MG tablet Take 10 mg by mouth 2 (two) times daily as needed (for migraines).    Historical Provider, MD  HYDROcodone-acetaminophen (NORCO/VICODIN) 5-325 MG tablet Take 2 tablets by mouth every 4 (four) hours as needed. 04/28/16   Earley Favor, NP  imipramine (TOFRANIL) 25 MG tablet Take 25 mg by mouth at bedtime.     Historical Provider, MD  omeprazole (PRILOSEC) 20 MG capsule Take 20 mg by mouth at bedtime.     Historical Provider, MD  valsartan-hydrochlorothiazide (DIOVAN-HCT) 320-12.5 MG per tablet Take 1 tablet by mouth daily.      Historical Provider, MD    Family History Family History  Problem Relation Age of Onset  . Hypertension Mother   . Heart failure Mother   . Aneurysm Mother   . Kidney failure Mother   . Cancer Father   . Emphysema Father   . Colon cancer Sister 2    dx 3 times; Lynch syndrome  . Cancer Other     Social History Social History  Substance Use Topics  . Smoking status: Never Smoker  . Smokeless tobacco: Never Used  . Alcohol use No     Allergies   Penicillins and Nsaids  Review of Systems Review of Systems  Constitutional: Positive for diaphoresis.  Cardiovascular: Positive for chest pain.  Gastrointestinal: Positive for nausea.  Neurological: Positive for light-headedness.  All other systems reviewed and are negative.    Physical Exam Updated Vital Signs BP 139/90 (BP Location: Right Arm)   Pulse 78   Temp 97.7 F (36.5 C) (Oral)   Resp 16   Wt 88 kg   SpO2 98%   BMI 32.28 kg/m   Physical Exam  Constitutional: She is oriented to person, place, and time. She appears well-developed and well-nourished. No distress.  HENT:  Head: Normocephalic and atraumatic.  Mouth/Throat: Oropharynx is clear and moist. No  oropharyngeal exudate.  Eyes: Conjunctivae and EOM are normal. Pupils are equal, round, and reactive to light. Right eye exhibits no discharge. Left eye exhibits no discharge. No scleral icterus.  No nystagmus  Neck: Normal range of motion. Neck supple.  Cardiovascular: Normal rate, regular rhythm, normal heart sounds and intact distal pulses.   Pulmonary/Chest: Effort normal and breath sounds normal. No respiratory distress. She has no wheezes. She has no rales. She exhibits tenderness (mild TTP over right mid anterior chest wall). She exhibits no laceration, no crepitus, no edema, no deformity, no swelling and no retraction.  Abdominal: Soft. Bowel sounds are normal. She exhibits no distension and no mass. There is no tenderness. There is no rebound and no guarding.  Musculoskeletal: Normal range of motion. She exhibits no edema.  Neurological: She is alert and oriented to person, place, and time.  Skin: Skin is warm and dry. She is not diaphoretic.  Nursing note and vitals reviewed.    ED Treatments / Results  Labs (all labs ordered are listed, but only abnormal results are displayed) Labs Reviewed  BASIC METABOLIC PANEL - Abnormal; Notable for the following:       Result Value   Chloride 100 (*)    Glucose, Bld 112 (*)    All other components within normal limits  CBC  I-STAT TROPOININ, ED    EKG  EKG Interpretation  Date/Time:  Tuesday January 28 2017 12:25:54 EDT Ventricular Rate:  74 PR Interval:    QRS Duration: 92 QT Interval:  441 QTC Calculation: 490 R Axis:   10 Text Interpretation:  Sinus rhythm RSR' in V1 or V2, right VCD or RVH Consider anterior infarct Confirmed by [facility] MD, JULIE ([id number]) on 01/28/2017 12:28:57 PM Also confirmed by [facility] MD, JULIE ([id number]), editor Misty Stanley [phone])  on 01/28/2017 1:30:53 PM       Radiology Dg Chest 2 View  Result Date: 01/28/2017 CLINICAL DATA:  Chest pain, dizziness EXAM: CHEST  2 VIEW COMPARISON:  None.  FINDINGS: No active infiltrate or effusion is seen. Mediastinal and hilar contours are unremarkable. The heart is within normal limits in size. A loop recorder overlies the left anterior chest. No acute bony abnormality is seen. IMPRESSION: No active cardiopulmonary disease. Electronically Signed   By: Dwyane Dee M.D.   On: 01/28/2017 13:47    Procedures Procedures (including critical care time)  Medications Ordered in ED Medications - No data to display   Initial Impression / Assessment and Plan / ED Course  I have reviewed the triage vital signs and the nursing notes.  Pertinent labs & imaging results that were available during my care of the patient were reviewed by me and considered in my medical decision making (see chart for details).    Patient presents with right-sided chest pain with associated diaphoresis,  lightheadedness and nausea that occurred around 11:30 AM while she was sitting at her desk at work. Symptoms have resolved since arrival to the ED. Denies taking any medications prior to arrival. Denies personal or family history of cardiac disease. VSS. Exam revealed mild tenderness over right anterior chest wall, lungs clear to auscultation bilaterally. Remaining exam unremarkable. EKG showed sinus rhythm with no acute ischemic changes. Trop negative. CXR negative. Labs unremarkable. HEART score 3. On reevaluation, pt resting comfortably in bed and continues to remain asymptomatic. Discussed pt with Dr. Particia Nearing, plan order delta trop. Discussed plan of repeat trop @ 4pm.   Hand-off to Dr. Chestine Spore. Dispo pending delta trop. If negative, plan to d/c pt home with close PCP follow up.  Final Clinical Impressions(s) / ED Diagnoses   Final diagnoses:  None    New Prescriptions New Prescriptions   No medications on file     Barrett Henle, PA-C 01/28/17 1536    Jacalyn Lefevre, MD 01/28/17 1546

## 2017-01-31 MED FILL — IMIPRAMINE HCL 25 MG TAB: 25 | 90 days supply | Qty: 90 | Fill #0

## 2017-02-10 DIAGNOSIS — R0782 Intercostal pain: Secondary | ICD-10-CM | POA: Diagnosis not present

## 2017-04-28 MED FILL — VALSARTAN-HCTZ 160-12.5 MG: 160-12.5 | 30 days supply | Qty: 30 | Fill #1

## 2017-05-20 MED FILL — IMIPRAMINE HCL 25 MG TAB: 25 | 90 days supply | Qty: 90 | Fill #1

## 2017-05-20 MED FILL — BACLOFEN 10 MG TABLET: 10 | 20 days supply | Qty: 20 | Fill #1

## 2017-05-27 MED FILL — VALSARTAN-HCTZ 160-12.5 MG: 160-12.5 | 30 days supply | Qty: 30 | Fill #2

## 2017-07-04 MED FILL — VALSARTAN-HCTZ 160-12.5 MG: 160-12.5 | 30 days supply | Qty: 30 | Fill #3

## 2017-08-04 MED FILL — VALSARTAN-HCTZ 160-12.5 MG: 160-12.5 | 30 days supply | Qty: 30 | Fill #4

## 2017-08-04 MED FILL — BACLOFEN 10 MG TABS: 10 | 20 days supply | Qty: 20 | Fill #0

## 2017-08-27 MED FILL — IMIPRAMINE HCL 25 MG TAB: 25 | 90 days supply | Qty: 90 | Fill #2

## 2017-08-28 MED FILL — VALSARTAN-HCTZ 160-12.5 MG: 160-12.5 | 30 days supply | Qty: 30 | Fill #5

## 2017-09-05 ENCOUNTER — Other Ambulatory Visit: Payer: Self-pay | Admitting: Internal Medicine

## 2017-09-05 ENCOUNTER — Ambulatory Visit
Admission: RE | Admit: 2017-09-05 | Discharge: 2017-09-05 | Disposition: A | Discharge status: Home / Self Care | Payer: 59 | Source: Ambulatory Visit | Attending: Internal Medicine | Admitting: Internal Medicine

## 2017-09-05 DIAGNOSIS — M1611 Unilateral primary osteoarthritis, right hip: Secondary | ICD-10-CM | POA: Diagnosis not present

## 2017-09-05 DIAGNOSIS — M25561 Pain in right knee: Secondary | ICD-10-CM | POA: Diagnosis not present

## 2017-09-05 DIAGNOSIS — G8929 Other chronic pain: Secondary | ICD-10-CM | POA: Diagnosis not present

## 2017-09-05 DIAGNOSIS — R200 Anesthesia of skin: Secondary | ICD-10-CM | POA: Diagnosis not present

## 2017-09-05 DIAGNOSIS — R05 Cough: Secondary | ICD-10-CM | POA: Diagnosis not present

## 2017-09-05 DIAGNOSIS — M25551 Pain in right hip: Secondary | ICD-10-CM | POA: Diagnosis not present

## 2017-09-05 DIAGNOSIS — M179 Osteoarthritis of knee, unspecified: Secondary | ICD-10-CM | POA: Diagnosis not present

## 2017-09-05 DIAGNOSIS — I10 Essential (primary) hypertension: Secondary | ICD-10-CM | POA: Diagnosis not present

## 2017-09-10 MED FILL — predniSONE 10 MG TABS: 10 | 8 days supply | Qty: 26 | Fill #0

## 2017-10-08 MED FILL — VALSARTAN-HCTZ 160-12.5 MG: 160-12.5 | 30 days supply | Qty: 30 | Fill #6

## 2017-10-08 MED FILL — BACLOFEN 10 MG TABS: 10 | 20 days supply | Qty: 20 | Fill #1

## 2017-10-24 ENCOUNTER — Other Ambulatory Visit: Payer: Self-pay | Admitting: Internal Medicine

## 2017-10-24 DIAGNOSIS — Z1231 Encounter for screening mammogram for malignant neoplasm of breast: Secondary | ICD-10-CM

## 2017-11-06 MED FILL — VALSARTAN-HCTZ 160-12.5 MG: 160-12.5 | 30 days supply | Qty: 30 | Fill #7

## 2017-12-03 MED FILL — IMIPRAMINE HCL 25 MG TAB: 25 | 90 days supply | Qty: 90 | Fill #3

## 2017-12-03 MED FILL — OMEPRAZOLE 20 MG CAP: 20 | 90 days supply | Qty: 90 | Fill #1

## 2017-12-03 MED FILL — BACLOFEN 10 MG TABLET: 10 | 20 days supply | Qty: 20 | Fill #0

## 2017-12-03 MED FILL — VALSARTAN-HCTZ 160-12.5 MG: 160-12.5 | 30 days supply | Qty: 30 | Fill #8

## 2017-12-05 ENCOUNTER — Ambulatory Visit
Admission: RE | Admit: 2017-12-05 | Discharge: 2017-12-05 | Disposition: A | Discharge status: Home / Self Care | Payer: 59 | Source: Ambulatory Visit | Attending: Internal Medicine | Admitting: Internal Medicine

## 2017-12-05 DIAGNOSIS — Z1231 Encounter for screening mammogram for malignant neoplasm of breast: Secondary | ICD-10-CM | POA: Diagnosis not present

## 2017-12-18 DIAGNOSIS — M179 Osteoarthritis of knee, unspecified: Secondary | ICD-10-CM | POA: Diagnosis not present

## 2017-12-18 DIAGNOSIS — E669 Obesity, unspecified: Secondary | ICD-10-CM | POA: Diagnosis not present

## 2017-12-18 DIAGNOSIS — M169 Osteoarthritis of hip, unspecified: Secondary | ICD-10-CM | POA: Diagnosis not present

## 2017-12-18 DIAGNOSIS — Z0000 Encounter for general adult medical examination without abnormal findings: Secondary | ICD-10-CM | POA: Diagnosis not present

## 2017-12-18 DIAGNOSIS — K219 Gastro-esophageal reflux disease without esophagitis: Secondary | ICD-10-CM | POA: Diagnosis not present

## 2017-12-18 DIAGNOSIS — I10 Essential (primary) hypertension: Secondary | ICD-10-CM | POA: Diagnosis not present

## 2017-12-18 DIAGNOSIS — Z13220 Encounter for screening for lipoid disorders: Secondary | ICD-10-CM | POA: Diagnosis not present

## 2018-01-12 MED FILL — VALSARTAN-HCTZ 160-12.5 MG: 160-12.5 | 30 days supply | Qty: 30 | Fill #9

## 2018-02-09 DIAGNOSIS — M1611 Unilateral primary osteoarthritis, right hip: Secondary | ICD-10-CM | POA: Diagnosis not present

## 2018-02-16 MED FILL — VALSARTAN-HCTZ 160-12.5 MG: 160-12.5 | 90 days supply | Qty: 90 | Fill #0

## 2018-03-16 DIAGNOSIS — M1611 Unilateral primary osteoarthritis, right hip: Secondary | ICD-10-CM | POA: Diagnosis not present

## 2018-04-01 MED FILL — IMIPRAMINE HCL 25 MG TABLET: 25 | 90 days supply | Qty: 90 | Fill #0

## 2018-05-20 MED FILL — OMEPRAZOLE 20 MG CPDR: 20 | 90 days supply | Qty: 90 | Fill #0

## 2018-05-20 MED FILL — VALSARTAN-HCTZ 160-12.5 MG: 160-12.5 | 90 days supply | Qty: 90 | Fill #1

## 2018-05-20 MED FILL — BACLOFEN 10 MG TABLET: 10 | 20 days supply | Qty: 20 | Fill #1

## 2018-06-29 DIAGNOSIS — I10 Essential (primary) hypertension: Secondary | ICD-10-CM | POA: Diagnosis not present

## 2018-07-20 MED FILL — IMIPRAMINE HCL 25 MG TABLET: 25 | 90 days supply | Qty: 90 | Fill #1

## 2018-08-23 MED FILL — VALSARTAN-HCTZ 160-12.5 MG: 160-12.5 | 30 days supply | Qty: 30 | Fill #2

## 2018-08-25 MED FILL — BACLOFEN 10 MG TABS: 10 | 20 days supply | Qty: 20 | Fill #0

## 2018-09-25 MED FILL — VALSARTAN-HCTZ 160-12.5 MG: 160-12.5 | 30 days supply | Qty: 30 | Fill #3

## 2018-10-25 MED FILL — VALSARTAN-HCTZ 160-12.5 MG: 160-12.5 | 30 days supply | Qty: 30 | Fill #4

## 2018-10-26 MED FILL — IMIPRAMINE HCL 25 MG TABLET: 25 | 90 days supply | Qty: 90 | Fill #0

## 2018-11-09 ENCOUNTER — Other Ambulatory Visit: Payer: Self-pay | Admitting: Internal Medicine

## 2018-11-09 DIAGNOSIS — Z1231 Encounter for screening mammogram for malignant neoplasm of breast: Secondary | ICD-10-CM

## 2018-11-17 DIAGNOSIS — H524 Presbyopia: Secondary | ICD-10-CM | POA: Diagnosis not present

## 2018-11-30 MED FILL — VALSARTAN-HCTZ 160-12.5 MG: 160-12.5 | 30 days supply | Qty: 30 | Fill #5

## 2018-12-18 ENCOUNTER — Ambulatory Visit
Admission: RE | Admit: 2018-12-18 | Discharge: 2018-12-18 | Disposition: A | Discharge status: Home / Self Care | Payer: 59 | Source: Ambulatory Visit | Attending: Internal Medicine | Admitting: Internal Medicine

## 2018-12-18 ENCOUNTER — Other Ambulatory Visit: Payer: Self-pay

## 2018-12-18 DIAGNOSIS — Z1231 Encounter for screening mammogram for malignant neoplasm of breast: Secondary | ICD-10-CM | POA: Diagnosis not present

## 2018-12-28 MED FILL — VALSARTAN-HCTZ 160-12.5 MG: 160-12.5 | 30 days supply | Qty: 30 | Fill #6

## 2018-12-28 MED FILL — BACLOFEN 10 MG TABS: 10 | 20 days supply | Qty: 20 | Fill #1

## 2019-01-11 DIAGNOSIS — M1611 Unilateral primary osteoarthritis, right hip: Secondary | ICD-10-CM | POA: Diagnosis not present

## 2019-02-01 MED FILL — IMIPRAMINE HCL 25 MG TABLET: 25 | 90 days supply | Qty: 90 | Fill #1

## 2019-02-01 MED FILL — VALSARTAN-HCTZ 160-12.5 MG: 160-12.5 | 30 days supply | Qty: 30 | Fill #7

## 2019-02-25 MED FILL — VALSARTAN-HCTZ 160-12.5 MG: 160-12.5 | 30 days supply | Qty: 30 | Fill #0

## 2019-03-03 DIAGNOSIS — M25551 Pain in right hip: Secondary | ICD-10-CM | POA: Diagnosis not present

## 2019-03-03 DIAGNOSIS — M1611 Unilateral primary osteoarthritis, right hip: Secondary | ICD-10-CM | POA: Diagnosis not present

## 2019-03-15 DIAGNOSIS — I10 Essential (primary) hypertension: Secondary | ICD-10-CM | POA: Diagnosis not present

## 2019-03-15 DIAGNOSIS — K219 Gastro-esophageal reflux disease without esophagitis: Secondary | ICD-10-CM | POA: Diagnosis not present

## 2019-03-15 DIAGNOSIS — Z13220 Encounter for screening for lipoid disorders: Secondary | ICD-10-CM | POA: Diagnosis not present

## 2019-03-15 DIAGNOSIS — Z800 Family history of malignant neoplasm of digestive organs: Secondary | ICD-10-CM | POA: Diagnosis not present

## 2019-03-15 DIAGNOSIS — I350 Nonrheumatic aortic (valve) stenosis: Secondary | ICD-10-CM | POA: Diagnosis not present

## 2019-03-15 DIAGNOSIS — Z23 Encounter for immunization: Secondary | ICD-10-CM | POA: Diagnosis not present

## 2019-03-15 DIAGNOSIS — M8580 Other specified disorders of bone density and structure, unspecified site: Secondary | ICD-10-CM | POA: Diagnosis not present

## 2019-03-15 DIAGNOSIS — Z0000 Encounter for general adult medical examination without abnormal findings: Secondary | ICD-10-CM | POA: Diagnosis not present

## 2019-03-15 DIAGNOSIS — G43909 Migraine, unspecified, not intractable, without status migrainosus: Secondary | ICD-10-CM | POA: Diagnosis not present

## 2019-03-15 DIAGNOSIS — Z1159 Encounter for screening for other viral diseases: Secondary | ICD-10-CM | POA: Diagnosis not present

## 2019-03-16 ENCOUNTER — Other Ambulatory Visit (HOSPITAL_COMMUNITY): Payer: Self-pay | Admitting: Internal Medicine

## 2019-03-16 ENCOUNTER — Other Ambulatory Visit: Payer: Self-pay | Admitting: Internal Medicine

## 2019-03-16 DIAGNOSIS — I350 Nonrheumatic aortic (valve) stenosis: Secondary | ICD-10-CM

## 2019-03-19 MED FILL — SHINGRIX 50 MCG SUS: 50 | 1 days supply | Qty: 1 | Fill #0

## 2019-03-22 MED FILL — VALSARTAN-HCTZ 160-12.5 MG: 160-12.5 | 30 days supply | Qty: 30 | Fill #1

## 2019-03-30 ENCOUNTER — Telehealth (HOSPITAL_COMMUNITY): Payer: Self-pay | Admitting: Radiology

## 2019-03-30 NOTE — Telephone Encounter (Signed)

## 2019-03-30 NOTE — Telephone Encounter (Signed)
Left message on voice mail  to call back

## 2019-03-31 ENCOUNTER — Ambulatory Visit (HOSPITAL_COMMUNITY): Payer: 59 | Attending: Cardiology

## 2019-03-31 ENCOUNTER — Other Ambulatory Visit: Payer: Self-pay

## 2019-03-31 ENCOUNTER — Encounter (HOSPITAL_COMMUNITY): Payer: Self-pay | Admitting: Radiology

## 2019-03-31 NOTE — Progress Notes (Signed)
Patient arrived with daughter for her echocardiogram today. Daughter was febrile and awaiting test results for COVID 19. Given that the patient and her daughter live together, the echocardiogram was cancelled at this time. Per [facility] protocol, we will await daughter's test results.

## 2019-04-14 ENCOUNTER — Other Ambulatory Visit: Payer: Self-pay

## 2019-04-14 ENCOUNTER — Ambulatory Visit (HOSPITAL_COMMUNITY): Payer: 59 | Attending: Cardiovascular Disease

## 2019-04-14 DIAGNOSIS — I350 Nonrheumatic aortic (valve) stenosis: Secondary | ICD-10-CM

## 2019-04-20 MED FILL — VALSARTAN-HCTZ 160-12.5 MG: 160-12.5 | 30 days supply | Qty: 30 | Fill #2

## 2019-04-26 MED FILL — IMIPRAMINE HCL 25 MG TABLET: 25 | 90 days supply | Qty: 90 | Fill #0

## 2019-04-27 ENCOUNTER — Telehealth: Payer: Self-pay | Admitting: Genetic Counselor

## 2019-04-27 ENCOUNTER — Encounter: Payer: Self-pay | Admitting: Genetic Counselor

## 2019-04-27 NOTE — Telephone Encounter (Signed)
Received a genetic counseling appt from Dr. Lavone Orn for family history of malignant neoplasm of digestive organs. Pt has been scheduled to see Clint Guy on 8/4 at 9am. Letter mailed.

## 2019-05-10 ENCOUNTER — Telehealth: Payer: Self-pay | Admitting: Genetic Counselor

## 2019-05-10 NOTE — Telephone Encounter (Signed)
Called patient regarding upcoming Webex appointment, left a voicemail. This will be a walk-in visit due to no communication to set it up as Webex.

## 2019-05-11 ENCOUNTER — Inpatient Hospital Stay: Payer: 59 | Attending: Genetic Counselor | Admitting: Genetic Counselor

## 2019-05-11 ENCOUNTER — Encounter: Payer: Self-pay | Admitting: Genetic Counselor

## 2019-05-11 ENCOUNTER — Inpatient Hospital Stay: Payer: 59

## 2019-05-11 ENCOUNTER — Other Ambulatory Visit: Payer: Self-pay

## 2019-05-11 DIAGNOSIS — Z8481 Family history of carrier of genetic disease: Secondary | ICD-10-CM | POA: Diagnosis not present

## 2019-05-11 DIAGNOSIS — Z800 Family history of malignant neoplasm of digestive organs: Secondary | ICD-10-CM | POA: Diagnosis not present

## 2019-05-11 DIAGNOSIS — Z808 Family history of malignant neoplasm of other organs or systems: Secondary | ICD-10-CM

## 2019-05-11 DIAGNOSIS — Z801 Family history of malignant neoplasm of trachea, bronchus and lung: Secondary | ICD-10-CM | POA: Insufficient documentation

## 2019-05-11 DIAGNOSIS — Z8041 Family history of malignant neoplasm of ovary: Secondary | ICD-10-CM | POA: Diagnosis not present

## 2019-05-11 DIAGNOSIS — Z8042 Family history of malignant neoplasm of prostate: Secondary | ICD-10-CM

## 2019-05-11 NOTE — Progress Notes (Signed)
REFERRING PROVIDER: Lavone Orn, MD [address] [place],  [place] [address]  PRIMARY PROVIDER:  Lavone Orn, MD  PRIMARY REASON FOR VISIT:  1. Family history of carrier of genetic disease   2. Family history of colon cancer   3. Family history of melanoma   4. Family history of ovarian cancer   5. Family history of lung cancer   6. Family history of prostate cancer   7. Family history of liver cancer      HISTORY OF PRESENT ILLNESS:   Ms. Guettler, a 63 y.o. female, was seen for a [place] cancer genetics consultation at the request of Dr. Laurann Montana due to a family history of known familial variants in cancer risk genes PMS2, BRIP1, and MITF.  Ms. Koors presents to clinic today to discuss the possibility of a hereditary predisposition to cancer, genetic testing, and to further clarify her future cancer risks, as well as potential cancer risks for family members.    Ms. Mesch is a 63 y.o. female with no personal history of cancer.    CANCER HISTORY:  Oncology History   No history exists.     RISK FACTORS:  Menarche was at age 37.  First live birth at age 1.  OCP use for approximately 17 years.  Ovaries intact: no.  Hysterectomy: yes.  Menopausal status: postmenopausal.  HRT use: 0 years. Colonoscopy: yes; "return in 10 years". Mammogram within the last year: yes. Number of breast biopsies: 1, benign.   Past Medical History:  Diagnosis Date  . Barrett esophagus   . Colitis   . Diverticulitis   . Family history of carrier of genetic disease   . Family history of colon cancer   . Family history of liver cancer   . Family history of lung cancer   . Family history of melanoma   . Family history of ovarian cancer   . Family history of prostate cancer   . GERD (gastroesophageal reflux disease)   . Heart murmur   . Hiatal hernia   . Hypertension   . S/P RF ablation operation for arrhythmia   . Ulcer     Past Surgical History:  Procedure  Laterality Date  . ABDOMINAL HYSTERECTOMY    . BREAST BIOPSY Left    10 yrs +  . CESAREAN SECTION    . CHOLECYSTECTOMY    . COLONOSCOPY  11/03/2012   Procedure: COLONOSCOPY;  Surgeon: Garlan Fair, MD;  Location: [facility];  Service: Endoscopy;  Laterality: N/A;  . ESOPHAGOGASTRODUODENOSCOPY  11/03/2012   Procedure: ESOPHAGOGASTRODUODENOSCOPY (EGD);  Surgeon: Garlan Fair, MD;  Location: [facility];  Service: Endoscopy;  Laterality: N/A;  . implanted loop device      Social History   Socioeconomic History  . Marital status: Widowed    Spouse name: Not on file  . Number of children: Not on file  . Years of education: Not on file  . Highest education level: Not on file  Occupational History  . Not on file  Social Needs  . Financial resource strain: Not on file  . Food insecurity    Worry: Not on file    Inability: Not on file  . Transportation needs    Medical: Not on file    Non-medical: Not on file  Tobacco Use  . Smoking status: Never Smoker  . Smokeless tobacco: Never Used  Substance and Sexual Activity  . Alcohol use: No  . Drug use: No  . Sexual  activity: Not on file  Lifestyle  . Physical activity    Days per week: Not on file    Minutes per session: Not on file  . Stress: Not on file  Relationships  . Social Herbalist on phone: Not on file    Gets together: Not on file    Attends religious service: Not on file    Active member of club or organization: Not on file    Attends meetings of clubs or organizations: Not on file    Relationship status: Not on file  Other Topics Concern  . Not on file  Social History Narrative  . Not on file     FAMILY HISTORY:  We obtained a detailed, 4-generation family history.  Significant diagnoses are listed below: Family History  Problem Relation Age of Onset  . Hypertension Mother   . Heart failure Mother   . Aneurysm Mother   . Kidney failure Mother   . Emphysema Father   . Prostate cancer  Father   . Lung cancer Father   . Colon cancer Sister 46       dx 3 times; Lynch syndrome (PMS2)  . Melanoma Sister 23  . Other Sister        genetic testing - PMS2+, BRIP1+, MITF+  . Melanoma Sister   . Other Sister        genetic testing - PMS2+  . Idiopathic pulmonary fibrosis Brother   . Lung cancer Maternal Uncle   . Liver cancer Maternal Uncle   . Lung cancer Paternal Uncle   . Ovarian cancer Paternal Grandmother        died early 91s  . Other Sister        genetic testing - BRIP1+, PMS2-, MITF-  . Other Sister        genetic testing - BRIP1+, MITF+, PMS2-  . Colon cancer Brother 7     Ms. Woldt has three children, Jobe Gibbon, and Andee Poles, who are in their 29s and 43s.   Ms. Stefanick is aware of four sisters who have had genetic testing with various results. Her sister, Jaci Standard, had colon cancer three times when she was 69, 45, and 46, as well as melanoma at 54 and lung cancer at 30. Shirlee had genetic testing and was found to have pathogenic variants in PMS2, BRIP1, and MITF. Another sister, Belenda Cruise", had melanoma and basal cell carcinoma and had genetic testing that was positive for the PMS2 variant. Another sister, Thayer Headings, had genetic testing that was positive for the BRIP1 variant. The fourth sister, Helene Kelp, had positive genetic testing for the BRIP1 and MITF variants. Ms. Kitchen had a brother, Araceli Bouche, who died at age 18 and had idiopathic pulmonary fibrosis, as well as possible lung cancer on autopsy.  Ms. Lesueur had another brother, Leroy Sea, who died at age 15 after he had colorectal cancer diagnosed at age 61. She also has three other brothers who have not had cancer or genetic testing to Ms. Ferber's knowledge.  Ms. Yoon father had prostate and lung cancer and died at age 2. She had one paternal uncle who had lung cancer. Ms. Donati paternal grandmother died from ovarian cancer in her early 39s.   Ms. Luedke mother died at age 23 from heart problems. She had one  maternal uncle who had lung cancer and liver cancer.   Ms. Dehaas maternal ancestors are of Irish/German descent, and paternal ancestors are of Irish/German descent. There is no reported Ashkenazi  Jewish ancestry. There is no known consanguinity.  GENETIC COUNSELING ASSESSMENT: Ms. Aarons is a 63 y.o. female with a family history of known pathogenic variants in cancer predisposition genes BRIP1, MITF, and PMS2. We, therefore, discussed and recommended the following at today's visit.   DISCUSSION: We discussed that 5 - 10% of cancer is hereditary, with many different genes known to cause hereditary cancer syndromes.  We discussed that testing is beneficial for several reasons, including knowing about cancer risks, identifying potential screening and risk-reduction options that may be appropriate, and to understand if other family members could be at risk for cancer and allow them to undergo genetic testing.   We discussed that pathogenic variants in the PMS2 gene cause Lynch syndrome and are associated with colon cancer and endometrial cancer, among other cancer types. This variant likely explains why her sister, Jaci Standard, had three diagnoses of colon cancer. Ms. Borchardt had negative genetic testing in 2014 for this variant, meaning that she is not at an increased risk for Lynch syndrome cancers based on these results.  We also discussed that pathogenic variants in the BRIP1 gene are associated with an increased risk for ovarian cancer and pathogenic variants in the MITF gene are associated with an increased risk for melanoma. Ms. Wolfert has not been tested to see if she has inherited these known familial variants. She has a 50% chance to have inherited each variant separately.  We reviewed the characteristics, features and inheritance patterns of hereditary cancer syndromes. We also discussed genetic testing, including the appropriate family members to test, the process of testing, insurance coverage and  turn-around-time for results. We discussed the implications of a negative, positive and/or variant of uncertain significant result. We recommended Ms. Zenia Resides pursue genetic testing for the [employer] gene panel.  The Multi-Gene Panel offered by [facility] includes sequencing and/or deletion duplication testing of the following 85 genes: AIP, ALK, APC, ATM, AXIN2,BAP1,  BARD1, BLM, BMPR1A, BRCA1, BRCA2, BRIP1, CASR, CDC73, CDH1, CDK4, CDKN1B, CDKN1C, CDKN2A (p14ARF), CDKN2A (p16INK4a), CEBPA, CHEK2, CTNNA1, DICER1, DIS3L2, EGFR (c.2369C>T, p.Thr790Met variant only), EPCAM (Deletion/duplication testing only), FH, FLCN, GATA2, GPC3, GREM1 (Promoter region deletion/duplication testing only), HOXB13 (c.251G>A, p.Gly84Glu), HRAS, KIT, MAX, MEN1, MET, MITF (c.952G>A, p.Glu318Lys variant only), MLH1, MSH2, MSH3, MSH6, MUTYH, NBN, NF1, NF2, NTHL1, PALB2, PDGFRA, PHOX2B, PMS2, POLD1, POLE, POT1, PRKAR1A, PTCH1, PTEN, RAD50, RAD51C, RAD51D, RB1, RECQL4, RET, RNF43, RUNX1, SDHAF2, SDHA (sequence changes only), SDHB, SDHC, SDHD, SMAD4, SMARCA4, SMARCB1, SMARCE1, STK11, SUFU, TERC, TERT, TMEM127, TP53, TSC1, TSC2, VHL, WRN and WT1.     Based on Ms. Fauver's family history of known pathogenic variants in cancer predisposition genes, she meets medical criteria for genetic testing. Despite that she meets criteria, she may still have an out of pocket cost. We discussed that if her out of pocket cost for testing is over $100, the laboratory will call and confirm whether she wants to proceed with testing.  If the out of pocket cost of testing is less than $100 she will be billed by the genetic testing laboratory.   PLAN: After considering the risks, benefits, and limitations, Ms. Zenia Resides provided informed consent to pursue genetic testing and the blood sample was sent to [facility] for analysis of the Multi-Cancer panel. Results should be available within approximately two-three weeks' time, at which point they will  be disclosed by telephone to Ms. Zenia Resides, as will any additional recommendations warranted by these results. Ms. Ehrsam will receive a summary of her genetic counseling visit and a  copy of her results once available. This information will also be available in Epic.   Based on Ms. Garcilazo's family history, we recommended her siblings who have not yet had predictive testing for the known familial variants have genetic counseling and testing. Ms. Printup will let us know if we can be of any assistance in coordinating genetic counseling and/or testing for these family members.   Ms. Sitzer questions were answered to her satisfaction today. Our contact information was provided should additional questions or concerns arise. Thank you for the referral and allowing [place] to share in the care of your patient.   Clint Guy, MS Genetic Counselor [email or web address] Phone: [phone]   The patient was seen for a total of 30 minutes in face-to-face genetic counseling.  This patient was discussed with Drs. Magrinat, Lindi Adie and/or Burr Medico who agrees with the above.    _______________________________________________________________________ For Office Staff:  Number of people involved in session: 1 Was an Intern/ student involved with case: no

## 2019-05-14 DIAGNOSIS — M545 Low back pain: Secondary | ICD-10-CM | POA: Diagnosis not present

## 2019-05-14 DIAGNOSIS — M1611 Unilateral primary osteoarthritis, right hip: Secondary | ICD-10-CM | POA: Diagnosis not present

## 2019-05-14 DIAGNOSIS — M25562 Pain in left knee: Secondary | ICD-10-CM | POA: Diagnosis not present

## 2019-05-20 MED FILL — VALSARTAN-HCTZ 160-12.5 MG: 160-12.5 | 30 days supply | Qty: 30 | Fill #3

## 2019-05-20 MED FILL — BACLOFEN 10 MG TABS: 10 | 20 days supply | Qty: 20 | Fill #0

## 2019-05-20 MED FILL — OMEPRAZOLE 20 MG CAP: 20 | 90 days supply | Qty: 90 | Fill #1

## 2019-05-20 MED FILL — EPINEPHRINE 0.3 MG AUTO-INJ: 0.3 | 2 days supply | Qty: 2 | Fill #0

## 2019-05-21 MED FILL — EPINEPHRINE 0.3 MG AUTO-INJ: 0.3 | 2 days supply | Qty: 2 | Fill #1

## 2019-05-28 MED FILL — SHINGRIX 50 MCG SUS: 50 | 1 days supply | Qty: 1 | Fill #1

## 2019-06-01 DIAGNOSIS — Z1589 Genetic susceptibility to other disease: Secondary | ICD-10-CM | POA: Insufficient documentation

## 2019-06-01 DIAGNOSIS — Z1501 Genetic susceptibility to malignant neoplasm of breast: Secondary | ICD-10-CM | POA: Insufficient documentation

## 2019-06-01 DIAGNOSIS — Z1379 Encounter for other screening for genetic and chromosomal anomalies: Secondary | ICD-10-CM | POA: Insufficient documentation

## 2019-06-02 ENCOUNTER — Encounter: Payer: Self-pay | Admitting: Genetic Counselor

## 2019-06-02 ENCOUNTER — Telehealth: Payer: Self-pay | Admitting: Genetic Counselor

## 2019-06-02 ENCOUNTER — Ambulatory Visit: Payer: Self-pay | Admitting: Genetic Counselor

## 2019-06-02 DIAGNOSIS — M1611 Unilateral primary osteoarthritis, right hip: Secondary | ICD-10-CM | POA: Diagnosis not present

## 2019-06-02 DIAGNOSIS — Z1501 Genetic susceptibility to malignant neoplasm of breast: Secondary | ICD-10-CM

## 2019-06-02 DIAGNOSIS — Z1589 Genetic susceptibility to other disease: Secondary | ICD-10-CM

## 2019-06-02 DIAGNOSIS — M25551 Pain in right hip: Secondary | ICD-10-CM | POA: Diagnosis not present

## 2019-06-02 DIAGNOSIS — Z1379 Encounter for other screening for genetic and chromosomal anomalies: Secondary | ICD-10-CM

## 2019-06-02 NOTE — Telephone Encounter (Signed)
Disclosed positive genetic testing results. 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 This result is associated with an ovarian cancer risk of up to 9.7% and possibly an increased risk for breast cancer. Rebecca Farley has already had her ovaries removed, which reduces but does not completely eliminate the risk for ovarian cancer. We also discussed risk to family members and recommendation for familial testing. Of note, genetic testing did not identify the other known familial variants in the MITF gene or PMS2 gene.

## 2019-06-02 NOTE — Progress Notes (Signed)
HPI:  Ms. Fuquay was previously seen in the [facility] due to a family history of BRIP1, MITF, and PMS2 pathogenic variants and concerns regarding a hereditary predisposition to cancer. Please refer to our prior cancer genetics clinic note for more information regarding our discussion, assessment and recommendations, at the time. Ms. Mccurley recent genetic test results were disclosed to her, as were recommendations warranted by these results. These results and recommendations are discussed in more detail below.  CANCER HISTORY:  Oncology History   No history exists.    FAMILY HISTORY:  We obtained a detailed, 4-generation family history.  Significant diagnoses are listed below: Family History  Problem Relation Age of Onset   Hypertension Mother    Heart failure Mother    Aneurysm Mother    Kidney failure Mother    Emphysema Father    Prostate cancer Father    Lung cancer Father    Colon cancer Sister 23       dx 3 times; Lynch syndrome (PMS2)   Melanoma Sister 81   Other Sister        genetic testing - PMS2+, BRIP1+, MITF+   Melanoma Sister    Other Sister        genetic testing - PMS2+   Idiopathic pulmonary fibrosis Brother    Lung cancer Maternal Uncle    Liver cancer Maternal Uncle    Lung cancer Paternal Uncle    Ovarian cancer Paternal Grandmother        died early 56s   Other Sister        genetic testing - BRIP1+, PMS2-, MITF-   Other Sister        genetic testing - BRIP1+, MITF+, PMS2-   Colon cancer Brother 68      Ms. Drakes has three children, Jobe Gibbon, and Andee Poles, who are in their 70s and 33s.   Ms. Arterburn is aware of four sisters who have had genetic testing with various results. Her sister, Jaci Standard, had colon cancer three times when she was 38, 25, and 76, as well as melanoma at 8 and lung cancer at 80. Shirlee had genetic testing and was found to have pathogenic variants in PMS2, BRIP1, and MITF. Another  sister, Belenda Cruise", had melanoma and basal cell carcinoma and had genetic testing that was positive for the PMS2 variant. Another sister, Thayer Headings, had genetic testing that was positive for the BRIP1 variant. The fourth sister, Helene Kelp, had positive genetic testing for the BRIP1 and MITF variants. Ms. Elden had a brother, Araceli Bouche, who died at age 67 and had idiopathic pulmonary fibrosis, as well as possible lung cancer on autopsy.  Ms. Montoro had another brother, Leroy Sea, who died at age 3 after he had colorectal cancer diagnosed at age 41. She also has three other brothers who have not had cancer or genetic testing to Ms. Mizzell's knowledge.  Ms. Machorro father had prostate and lung cancer and died at age 55. She had one paternal uncle who had lung cancer. Ms. Wollenberg paternal grandmother died from ovarian cancer in her early 43s.   Ms. Colantonio mother died at age 59 from heart problems. She had one maternal uncle who had lung cancer and liver cancer.   Ms. Schepp maternal ancestors are of Irish/German descent, and paternal ancestors are of Irish/German descent. There is no reported Ashkenazi Jewish ancestry. There is no known consanguinity.  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 The test report will be scanned into EPIC and located under the Molecular Pathology section of the Results Review tab.  A portion of the result report is included below for reference.   Of note, the familial variant in MITF was not detected in Ms. Zenia Resides, and she had previously tested negative for the familial PMS2 variant.  We call this result a true negative result because these two cancer-causing mutation was identified in Ms. Tech's family, and she did not inherit it.    The Multi-Cancer Panel offered by [facility] includes sequencing and/or deletion duplication testing of the following  85 genes: AIP, ALK, APC, ATM, AXIN2,BAP1,  BARD1, BLM, BMPR1A, BRCA1, BRCA2, BRIP1, CASR, CDC73, CDH1, CDK4, CDKN1B, CDKN1C, CDKN2A (p14ARF), CDKN2A (p16INK4a), CEBPA, CHEK2, CTNNA1, DICER1, DIS3L2, EGFR (c.2369C>T, p.Thr790Met variant only), EPCAM (Deletion/duplication testing only), FH, FLCN, GATA2, GPC3, GREM1 (Promoter region deletion/duplication testing only), HOXB13 (c.251G>A, p.Gly84Glu), HRAS, KIT, MAX, MEN1, MET, MITF (c.952G>A, p.Glu318Lys variant only), MLH1, MSH2, MSH3, MSH6, MUTYH, NBN, NF1, NF2, NTHL1, PALB2, PDGFRA, PHOX2B, PMS2, POLD1, POLE, POT1, PRKAR1A, PTCH1, PTEN, RAD50, RAD51C, RAD51D, RB1, RECQL4, RET, RNF43, RUNX1, SDHAF2, SDHA (sequence changes only), SDHB, SDHC, SDHD, SMAD4, SMARCA4, SMARCB1, SMARCE1, STK11, SUFU, TERC, TERT, TMEM127, TP53, TSC1, TSC2, VHL, WRN and WT1.   Clinical condition Women who are carriers of a single pathogenic BRIP1 variant have an increased risk of ovarian cancer. Studies suggest the risk of ovarian cancer is approximately 8% and may be up to 15% (PMID: 51700174, 94496759, 16384665, 99357017). An individual with a BRIP1 pathogenic variant will not necessarily develop cancer in their lifetime, but the risk for cancer is increased over the general population risk.   BRIP1 also possibly increases the risk for breast cancer. There are conflicting data regarding breast cancer risks, with some studies demonstrating up to a 2-fold risk (PMID: 79390300) while another found no association (PMID: 1234567890). More studies are needed to determine the breast cancer risk.  Inheritance Hereditary predisposition to cancer due to a single pathogenic variant in the BRIP1 gene has autosomal dominant inheritance. This means that an individual with a pathogenic variant has a 50% chance of passing the condition on to their offspring. Once such a variant is detected in an individual, it is possible to identify at-risk relatives who can pursue testing for this specific familial  variant.  Additionally, individuals with a pathogenic variant in BRIP1 are carriers of Fanconi anemia type J. Fanconi anemia is an autosomal recessive disorder that is characterized by bone marrow failure and variable presentation of anomalies, including short stature, abnormal skin pigmentation, abnormal thumbs, malformations of the skeletal and central nervous systems, and developmental delay (PMID: 9233007, 62263335). Risks for leukemia and early onset solid tumors are significantly elevated (PMID: 45625638, 93734287, 68115726). For there to be a risk of Fanconi anemia in offspring, both parents would each have to have a single pathogenic variant in [place]; in such a case, the risk of having an affected child is 25%.  Management Recommendations The National Comprehensive Cancer Network (NCCN) recommends consideration of prophylactic salpingo-oophorectomy (surgical removal of the ovaries and fallopian tubes) for women with a pathogenic variant in BRIP1 after childbearing is complete. The current evidence is insufficient to make a firm recommendation as to the optimal age for this procedure. However, based on the current, limited evidence base, a discussion about surgery should be held around 7-71 years of age or earlier based on a specific family history of early-onset ovarian cancer (  Artist. Genetic/Familial High-Risk Assessment: Breast and Ovarian. Version 1.2020). Of note, Ms. Dolce has already had her ovaries removed.  The current NCCN guidelines do not recommend additional breast cancer screening for individuals with a single pathogenic BRIP1 variant beyond what is recommended for the general population. Cancer screening should ultimately be guided by personal and family history. An individuals cancer risk and medical management are not determined by genetic test results alone. Overall cancer risk assessment incorporates additional factors, including personal medical  history, family history, and any available genetic information that may result in a personalized plan for cancer prevention and surveillance.  Knowing if a pathogenic variant in BRIP1 is present is advantageous. At-risk relatives can be identified, enabling pursuit of a diagnostic evaluation. Further, the available information regarding hereditary cancer susceptibility genes is constantly evolving and more clinically relevant data regarding BRIP1 are likely to become available in the near future. Awareness of this cancer predisposition encourages patients and their providers to inform at-risk family members, to diligently follow screening protocols, and to be vigilant in maintaining close and regular contact with their local genetics clinic in anticipation of new information.   RECOMMENDATIONS FOR FAMILY MEMBERS: It is important that all of Ms. Congleton's relatives (both men and women) know of the presence of this gene mutation.  Women need to know that they may be at increased risk for ovarian and possibly breast cancer.  Men are not known to be at an increased risk for cancer due to [place], but this may provide important information for their reproductive risks and cancer risks for their children.  Genetic testing can sort out who in the family is at risk and who is not.    Ms. Lieurance children are at a 50% risk to have inherited the mutation found in her. We recommend they have genetic testing for this same mutation, as identifying the presence of this mutation would allow them to also take advantage of risk-reducing measures. We would be happy to help meet with and coordinate genetic testing for any relative that is interested.  FOLLOW-UP: Lastly, we discussed with Ms. Curtice that cancer genetics is a rapidly advancing field and it is possible that new genetic tests will be appropriate for her and/or her family members in the future. We encouraged her to remain in contact with cancer genetics on an annual  basis so we can update her personal and family histories and let her know of advances in cancer genetics that may benefit this family.   Our contact number was provided. Ms. Tat questions were answered to her satisfaction, and she knows she is welcome to call us at anytime with additional questions or concerns.   Clint Guy, MS Genetic Counselor [email or web address] Phone: [phone]

## 2019-06-03 ENCOUNTER — Encounter: Payer: Self-pay | Admitting: Genetic Counselor

## 2019-06-05 MED FILL — BACLOFEN 10 MG TABS: 10 | 20 days supply | Qty: 20 | Fill #1

## 2019-06-16 ENCOUNTER — Encounter: Payer: Self-pay | Admitting: Interventional Cardiology

## 2019-06-17 ENCOUNTER — Ambulatory Visit: Payer: 59 | Admitting: Interventional Cardiology

## 2019-06-17 ENCOUNTER — Other Ambulatory Visit: Payer: Self-pay

## 2019-06-17 ENCOUNTER — Encounter: Payer: Self-pay | Admitting: Interventional Cardiology

## 2019-06-17 VITALS — BP 128/96 | HR 74 | Ht 65.0 in | Wt 191.2 lb

## 2019-06-17 DIAGNOSIS — I10 Essential (primary) hypertension: Secondary | ICD-10-CM | POA: Diagnosis not present

## 2019-06-17 DIAGNOSIS — I350 Nonrheumatic aortic (valve) stenosis: Secondary | ICD-10-CM

## 2019-06-17 DIAGNOSIS — I4892 Unspecified atrial flutter: Secondary | ICD-10-CM | POA: Diagnosis not present

## 2019-06-17 MED FILL — SHINGRIX 50 MCG SUS: 50 | 1 days supply | Qty: 1 | Fill #1

## 2019-06-17 NOTE — Patient Instructions (Signed)

## 2019-06-17 NOTE — Progress Notes (Signed)
Cardiology Office Note   Date:  06/17/2019   ID:  Rebecca PotterCarol Lyn Farley, DOB 11/08/1955, MRN [id number]  PCP:  Rebecca Farley, John, MD    No chief complaint on file.  Aortic stenosis  Wt Readings from Last 3 Encounters:  06/17/19 191 lb 3.2 oz (86.7 kg)  11/03/12 194 lb (88 kg)       History of Present Illness: Rebecca LessCarol Lyn Farley is a 63 y.o. female who is being seen today for the evaluation of aortic stenosisat the request of Rebecca Farley, John, MD.  She had a h/o atrial flutter in the past and [place] many years ago.  She had a heart murmur on her routine exam and ws sent for an echo.  She was not having any new symptoms.   She does report some DOE for 15 years.  She has had soe dizzy spells for 15 years as well.  It can happen even when she was sitting down.  She wore a monitor many years ago and this was ewhen the AFlutter was diagnosed.  HR was 300 bpm per her report.  Dizziness did not resolve with ablation.  She walks regularly, for short intervals only due to arthritis.  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  Denies : Chest pain.  Leg edema. Nitroglycerin use. Orthopnea. Palpitations. Paroxysmal nocturnal dyspnea.Syncope.   Exercise is limited by arthritis more than anything else.  SHOB does not make her stop walking. No exertional lightheadedness.      Past Medical History:  Diagnosis Date  . Atrial flutter (HCC) 01/2008   RADIOFREQUENCY ABLATION [place]  . Barrett esophagus   . Bee sting allergy   . Colitis   . Diverticulitis   . DJD (degenerative joint disease) of knee    KNEES, BACK (LUMBAR FACET DISEASE) AND HIPS, DALLDORF.  Marland Kitchen. Family history of carrier of genetic disease   . Family history of colon cancer   . Family history of liver cancer   . Family history of lung cancer   . Family history of melanoma   . Family history of ovarian cancer   . Family history of prostate cancer   . GERD (gastroesophageal reflux disease)   . Heart  murmur   . Hiatal hernia   . History of syncope    RECURRENT  . Hyperlipidemia    BORDERLINE  . Hypertension   . Migraines   . Obesity   . Osteopenia   . S/P RF ablation operation for arrhythmia   . Shoulder tendinitis, left 2008  . Systolic murmur 12/2014   MILD AS, GRADE I DIASTOLIC DYSFUNCTION, ECHO  . Ulcer     Past Surgical History:  Procedure Laterality Date  . ABDOMINAL HYSTERECTOMY    . BREAST BIOPSY Left    10 yrs +  . CESAREAN SECTION     1979, 1984  . CHOLECYSTECTOMY    . COLONOSCOPY  11/03/2012   Procedure: COLONOSCOPY;  Surgeon: Charolett BumpersMartin K Johnson, MD;  Location: [facility];  Service: Endoscopy;  Laterality: N/A;  . ESOPHAGOGASTRODUODENOSCOPY  11/03/2012   Procedure: ESOPHAGOGASTRODUODENOSCOPY (EGD);  Surgeon: Charolett BumpersMartin K Johnson, MD;  [facility];  Service: Endoscopy;  Laterality: N/A;  . implanted loop device       Current Outpatient Medications  Medication Sig Dispense Refill  . b complex vitamins tablet Take 1 tablet by mouth daily.    . baclofen (LIORESAL) 10 MG tablet Take 10 mg by mouth 2 (two) times daily  as needed (for migraines).    . EPINEPHrine (EPIPEN 2-PAK) 0.3 mg/0.3 mL IJ SOAJ injection Inject 0.3 mg into the muscle as needed for anaphylaxis.    . Glucosamine-Chondroit-Vit C-Mn (GLUCOSAMINE CHONDR 1500 COMPLX) CAPS Take 2 capsules by mouth daily.    Marland Kitchen HYDROcodone-acetaminophen (NORCO/VICODIN) 5-325 MG tablet Take 2 tablets by mouth every 4 (four) hours as needed. 10 tablet 0  . imipramine (TOFRANIL) 25 MG tablet Take 25 mg by mouth at bedtime.     . Multiple Minerals-Vitamins (CITRACAL PLUS) TABS Take by mouth.    . Multiple Vitamin (MULTIVITAMIN) tablet Take 1 tablet by mouth daily.    Marland Kitchen omeprazole (PRILOSEC) 20 MG capsule Take 20 mg by mouth at bedtime.     . valsartan-hydrochlorothiazide (DIOVAN-HCT) 320-12.5 MG per tablet Take 1 tablet by mouth daily.       No current facility-administered medications for this visit.      Allergies:   Penicillins, Codeine, Mobic [meloxicam], and Nsaids    Social History:  The patient  reports that she has never smoked. She has never used smokeless tobacco. She reports that she does not drink alcohol or use drugs.   Family History:  The patient's *family history includes Aneurysm in her mother; Colon cancer (age of onset: 63) in her sister; Colon cancer (age of onset: 20) in her brother; Emphysema in her father; Heart failure in her mother; Hypertension in her mother; Idiopathic pulmonary fibrosis in her brother; Kidney failure in her mother; Liver cancer in her maternal uncle; Lung cancer in her father, maternal uncle, and paternal uncle; Melanoma in her sister; Melanoma (age of onset: 4) in her sister; Other in her sister, sister, sister, and sister; Ovarian cancer in her paternal grandmother; Prostate cancer in her father. Mother had CABG and died at age 2.  Siblings with HTN.   ROS:  Please see the history of present illness.   Otherwise, review of systems are positive for DOE.   All other systems are reviewed and negative.    PHYSICAL EXAM: VS:  BP (!) 128/96   Pulse 74   Ht 5\' 5"  (1.651 m)   Wt 191 lb 3.2 oz (86.7 kg)   SpO2 97%   BMI 31.82 kg/m  , BMI Body mass index is 31.82 kg/m. GEN: Well nourished, well developed, in no acute distress  HEENT: normal  Neck: no JVD, carotid bruits, or masses Cardiac: RRR; 3/6 systolic murmurs, no rubs, or gallops,no edema  Respiratory:  clear to auscultation bilaterally, normal work of breathing GI: soft, nontender, nondistended, + BS MS: no deformity or atrophy  Skin: warm and dry, no rash Neuro:  Strength and sensation are intact Psych: euthymic mood, full affect   EKG:   The ekg ordered today demonstrates NSR, no ST changes   Recent Labs: No results found for requested labs within last 8760 hours.   Lipid Panel No results found for: CHOL, TRIG, HDL, CHOLHDL, VLDL, LDLCALC, LDLDIRECT   Other studies Reviewed:  Additional studies/ records that were reviewed today with results demonstrating: echo results reviewed.   ASSESSMENT AND PLAN:  1. Aortic stenosis: DOE and dizziness that preceded her diagnosis of AS.  No syncope.  No change in sx.  She will let us know if exercise tolerance decreases or if she has CP, SHOB, syncope.  Of note, no LVH on ECG noted.  If she has sx, will need left and right heart cath.  2. Atrial flutter: No recurrence since her ablation. 3. HTN:  The current medical regimen is effective;  continue present plan and medications.    Current medicines are reviewed at length with the patient today.  The patient concerns regarding her medicines were addressed.  The following changes have been made:  No change  Labs/ tests ordered today include:  No orders of the defined types were placed in this encounter.   Recommend 150 minutes/week of aerobic exercise Low fat, low carb, high fiber diet recommended  Disposition:   FU in 6 months   Signed, Larae Grooms, MD  06/17/2019 11:09 AM    [facility] [place], [place], [place]  [address] Phone: [phone]; Fax: [phone]

## 2019-06-19 MED FILL — VALSARTAN-HCTZ 160-12.5 MG: 160-12.5 | 30 days supply | Qty: 30 | Fill #4

## 2019-06-24 MED FILL — BACLOFEN 10 MG TABS: 10 | 20 days supply | Qty: 20 | Fill #0

## 2019-06-25 ENCOUNTER — Ambulatory Visit: Payer: 59 | Admitting: Interventional Cardiology

## 2019-07-10 MED FILL — BACLOFEN 10 MG TABS: 10 | 20 days supply | Qty: 20 | Fill #1

## 2019-07-19 MED FILL — IMIPRAMINE HCL 25 MG TABLET: 25 | 90 days supply | Qty: 90 | Fill #1

## 2019-07-19 MED FILL — VALSARTAN-HCTZ 160-12.5 MG: 160-12.5 | 30 days supply | Qty: 30 | Fill #5

## 2019-08-05 MED FILL — BACLOFEN 10 MG TABS: 10 | 20 days supply | Qty: 20 | Fill #0

## 2019-08-18 MED FILL — VALSARTAN-HCTZ 160-12.5 MG: 160-12.5 | 30 days supply | Qty: 30 | Fill #6

## 2019-08-21 MED FILL — BACLOFEN 10 MG TABS: 10 | 20 days supply | Qty: 20 | Fill #1

## 2019-08-30 DIAGNOSIS — M25551 Pain in right hip: Secondary | ICD-10-CM | POA: Diagnosis not present

## 2019-08-30 DIAGNOSIS — M1611 Unilateral primary osteoarthritis, right hip: Secondary | ICD-10-CM | POA: Diagnosis not present

## 2019-09-08 MED FILL — BACLOFEN 10 MG TABS: 10 | 20 days supply | Qty: 20 | Fill #0

## 2019-09-17 MED FILL — VALSARTAN-HCTZ 160-12.5 MG: 160-12.5 | 30 days supply | Qty: 30 | Fill #7

## 2019-09-20 DIAGNOSIS — M169 Osteoarthritis of hip, unspecified: Secondary | ICD-10-CM | POA: Diagnosis not present

## 2019-09-20 DIAGNOSIS — K219 Gastro-esophageal reflux disease without esophagitis: Secondary | ICD-10-CM | POA: Diagnosis not present

## 2019-09-20 DIAGNOSIS — I10 Essential (primary) hypertension: Secondary | ICD-10-CM | POA: Diagnosis not present

## 2019-09-20 DIAGNOSIS — M8580 Other specified disorders of bone density and structure, unspecified site: Secondary | ICD-10-CM | POA: Diagnosis not present

## 2019-09-20 DIAGNOSIS — G43909 Migraine, unspecified, not intractable, without status migrainosus: Secondary | ICD-10-CM | POA: Diagnosis not present

## 2019-09-21 ENCOUNTER — Other Ambulatory Visit: Payer: Self-pay | Admitting: Internal Medicine

## 2019-09-21 DIAGNOSIS — M8580 Other specified disorders of bone density and structure, unspecified site: Secondary | ICD-10-CM

## 2019-09-24 MED FILL — BACLOFEN 10 MG TABS: 10 | 20 days supply | Qty: 20 | Fill #1

## 2019-10-12 ENCOUNTER — Other Ambulatory Visit: Payer: Self-pay | Admitting: Internal Medicine

## 2019-10-12 DIAGNOSIS — Z1231 Encounter for screening mammogram for malignant neoplasm of breast: Secondary | ICD-10-CM

## 2019-10-12 MED FILL — BACLOFEN 10 MG TABS: 10 | 20 days supply | Qty: 20 | Fill #0

## 2019-10-18 MED FILL — VALSARTAN-HCTZ 160-12.5 MG: 160-12.5 | 30 days supply | Qty: 30 | Fill #8

## 2019-10-18 MED FILL — IMIPRAMINE HCL 25 MG TABLET: 25 | 90 days supply | Qty: 90 | Fill #0

## 2019-10-28 ENCOUNTER — Telehealth: Payer: Self-pay | Admitting: *Deleted

## 2019-10-28 NOTE — Telephone Encounter (Signed)
Dr. Eldridge Dace, this pt has aortic stenosis that worsened from 2016 to echo in 04/2019 with a calculated valve area of 0.89 cm, mean gradient 38.0, peak gradient 70.1.  We are requested to address clearance for hip replacement surgery.  You last saw her in the office on 06/17/2019. Should she be seen in office for clearance? Should I go ahead and order an echo?  Please route response back to P CV DIV PREOP  Thanks, Coralee North

## 2019-10-28 NOTE — Telephone Encounter (Signed)
   [facility] Pre-operative Risk Assessment    Request for surgical clearance:  1. What type of surgery is being performed? RIGHT HIP ARTHROPLASTY   2. When is this surgery scheduled? TBD   3. What type of clearance is required (medical clearance vs. Pharmacy clearance to hold med vs. Both)? MEDICAL  4. Are there any medications that need to be held prior to surgery and how long? NONE LISTED   5. Practice name and name of physician performing surgery? [facility]; DR. Collier Salina DALLDORF   6. What is your office phone number [phone]   7.   What is your office fax number [phone]  8.   Anesthesia type (None, local, MAC, general) ? SPINAL   Rebecca Farley 10/28/2019, 12:48 PM  _________________________________________________________________   (provider comments below)

## 2019-10-29 NOTE — Telephone Encounter (Signed)
If she is not having CP, dizziness, syncope or heart failure sx, I think she can have hip surgery.  She does not need aortic valve intervention until developing symptoms.

## 2019-11-01 NOTE — Telephone Encounter (Signed)
   Primary Cardiologist: Lance Muss, MD  Chart reviewed as part of pre-operative protocol coverage. Patient was contacted 11/01/2019 in reference to pre-operative risk assessment for pending surgery as outlined below.  Rebecca Farley was last seen on 06/17/2019 by Dr. Eldridge Dace.  Since that day, Rebecca Farley has done well. She has had occasional brief lightheadedness that has been stable for several years. She has had no chest pain, shortness of breath, orthopnea, PND, edema or syncope.   Patient has moderate-severe aortic stenosis, asymptomatic and stable with no indication for aortic valve intervention. She will need close hemodynamic monitoring with the procedure, avoiding hypotension.  Therefore, based on ACC/AHA guidelines, the patient would be at acceptable risk for the planned procedure without further cardiovascular testing.   I will route this recommendation to the requesting party via Epic fax function and remove from pre-op pool.  Please call with questions.  Berton Bon, NP 11/01/2019, 10:55 AM

## 2019-11-01 NOTE — Telephone Encounter (Signed)
I placed call to review current status and symptoms. Left VM for pt to call back and ask for preop clinic.

## 2019-11-03 DIAGNOSIS — M25851 Other specified joint disorders, right hip: Secondary | ICD-10-CM | POA: Diagnosis not present

## 2019-11-03 DIAGNOSIS — Z01818 Encounter for other preprocedural examination: Secondary | ICD-10-CM | POA: Diagnosis not present

## 2019-11-08 MED FILL — tiZANidine HCL 4 MG TABS: 4 | 10 days supply | Qty: 40 | Fill #0

## 2019-11-08 MED FILL — HYDROCODON-APAP 5-325: 5-325 | 5 days supply | Qty: 40 | Fill #0

## 2019-11-08 MED FILL — ASPIRIN LOW DOSE 81 MG CHEW: 81 | 30 days supply | Qty: 60 | Fill #0

## 2019-11-16 ENCOUNTER — Other Ambulatory Visit: Payer: Self-pay | Admitting: Orthopaedic Surgery

## 2019-11-16 NOTE — Patient Instructions (Addendum)
DUE TO COVID-19 ONLY ONE VISITOR IS ALLOWED TO COME WITH YOU AND STAY IN THE WAITING ROOM ONLY DURING PRE OP AND PROCEDURE DAY OF SURGERY. THE 1 VISITOR MAY VISIT WITH YOU AFTER SURGERY IN YOUR PRIVATE ROOM DURING VISITING HOURS ONLY!  YOU NEED TO HAVE A COVID 19 TEST ON 2/12/21_ @, 12:20 PMTHIS TEST MUST BE DONE BEFORE SURGERY, COME  [address], [place] , [address].  [facility]) ONCE YOUR COVID TEST IS COMPLETED, PLEASE BEGIN THE QUARANTINE INSTRUCTIONS AS OUTLINED IN YOUR HANDOUT.                [name]    Your procedure is scheduled on: 11/23/19   Report to [facility] Main  Entrance   Report to admitting at: 7:45 AM     Call this number if you have problems the morning of surgery [phone]    Remember: NOTHING BY MOUTH AFTER MIDNIGHT.   BRUSH YOUR TEETH MORNING OF SURGERY AND RINSE YOUR MOUTH OUT, NO CHEWING GUM CANDY OR MINTS.     Take these medicines the morning of surgery with A SIP OF WATER: OMEPRAZOLE                                 You may not have any metal on your body including hair pins and              piercings  Do not wear jewelry, make-up, lotions, powders or perfumes, deodorant             Do not wear nail polish on your fingernails.  Do not shave  48 hours prior to surgery.           .   Do not bring valuables to the hospital. [facility] IS NOT             RESPONSIBLE   FOR VALUABLES.  Contacts, dentures or bridgework may not be worn into surgery.  Leave suitcase in the car. After surgery it may be brought to your room.            [facility] - Preparing for Surgery Before surgery, you can play an important role.  Because skin is not sterile, your skin needs to be as free of germs as possible.  You can reduce the number of germs on your skin by washing with CHG (chlorahexidine gluconate) soap before surgery.  CHG is an antiseptic cleaner which kills germs and bonds with the skin to continue killing germs even after  washing. Please DO NOT use if you have an allergy to CHG or antibacterial soaps.  If your skin becomes reddened/irritated stop using the CHG and inform your nurse when you arrive at Short Stay. Do not shave (including legs and underarms) for at least 48 hours prior to the first CHG shower.  You may shave your face/neck. Please follow these instructions carefully:  1.  Shower with CHG Soap the night before surgery and the  morning of Surgery.  2.  If you choose to wash your hair, wash your hair first as usual with your  normal  shampoo.  3.  After you shampoo, rinse your hair and body thoroughly to remove the  shampoo.                           4.  Use CHG  as you would any other liquid soap.  You can apply chg directly  to the skin and wash                       Gently with a scrungie or clean washcloth.  5.  Apply the CHG Soap to your body ONLY FROM THE NECK DOWN.   Do not use on face/ open                           Wound or open sores. Avoid contact with eyes, ears mouth and genitals (private parts).                       Wash face,  Genitals (private parts) with your normal soap.             6.  Wash thoroughly, paying special attention to the area where your surgery  will be performed.  7.  Thoroughly rinse your body with warm water from the neck down.  8.  DO NOT shower/wash with your normal soap after using and rinsing off  the CHG Soap.                9.  Pat yourself dry with a clean towel.            10.  Wear clean pajamas.            11.  Place clean sheets on your bed the night of your first shower and do not  sleep with pets. Day of Surgery : Do not apply any lotions/deodorants the morning of surgery.  Please wear clean clothes to the hospital/surgery center.  FAILURE TO FOLLOW THESE INSTRUCTIONS MAY RESULT IN THE CANCELLATION OF YOUR SURGERY PATIENT SIGNATURE_________________________________  NURSE  SIGNATURE__________________________________  ________________________________________________________________________

## 2019-11-16 NOTE — Progress Notes (Signed)
CAN YOU PLEASE SOME ORDERS FOR THE UPCOMING SURGERY.PT. HAS A PST. APPOINTMENT ON 11/17/19. tHANKS

## 2019-11-17 ENCOUNTER — Encounter (HOSPITAL_COMMUNITY)
Admission: RE | Admit: 2019-11-17 | Discharge: 2019-11-17 | Disposition: A | Discharge status: Home / Self Care | Payer: Medicare Other | Source: Ambulatory Visit | Attending: Orthopaedic Surgery | Admitting: Orthopaedic Surgery

## 2019-11-17 ENCOUNTER — Other Ambulatory Visit: Payer: Self-pay

## 2019-11-17 ENCOUNTER — Encounter (HOSPITAL_COMMUNITY): Payer: Self-pay

## 2019-11-17 DIAGNOSIS — Z01810 Encounter for preprocedural cardiovascular examination: Secondary | ICD-10-CM | POA: Diagnosis present

## 2019-11-17 DIAGNOSIS — R9431 Abnormal electrocardiogram [ECG] [EKG]: Secondary | ICD-10-CM | POA: Diagnosis not present

## 2019-11-17 DIAGNOSIS — Z01812 Encounter for preprocedural laboratory examination: Secondary | ICD-10-CM | POA: Diagnosis present

## 2019-11-17 DIAGNOSIS — Z20822 Contact with and (suspected) exposure to covid-19: Secondary | ICD-10-CM | POA: Insufficient documentation

## 2019-11-17 NOTE — Progress Notes (Signed)
PCP - DR. GRIFFIN. LOV: 06/17/19 Cardiologist - DR. VARANASI. LOV: 06/28/19 CLEARANCE. EPIC  Chest x-ray -  EKG - 06/27/19. EPIC Stress Test -  ECHO - 04/14/19 EPIC Cardiac Cath -   Sleep Study -  CPAP -   Fasting Blood Sugar -  Checks Blood Sugar _____ times a day  Blood Thinner Instructions: Aspirin Instructions: Last Dose:  Anesthesia review:   Patient denies shortness of breath, fever, cough and chest pain at PAT appointment   Patient verbalized understanding of instructions that were given to them at the PAT appointment. Patient was also instructed that they will need to review over the PAT instructions again at home before surgery.

## 2019-11-19 ENCOUNTER — Ambulatory Visit (HOSPITAL_COMMUNITY): Admission: RE | Admit: 2019-11-19 | Payer: Medicare Other | Source: Ambulatory Visit

## 2019-11-19 ENCOUNTER — Encounter (HOSPITAL_COMMUNITY)
Admission: RE | Admit: 2019-11-19 | Discharge: 2019-11-19 | Disposition: A | Discharge status: Home / Self Care | Payer: Medicare Other | Source: Ambulatory Visit | Attending: Orthopaedic Surgery | Admitting: Orthopaedic Surgery

## 2019-11-19 ENCOUNTER — Other Ambulatory Visit: Payer: Self-pay

## 2019-11-19 ENCOUNTER — Other Ambulatory Visit (HOSPITAL_COMMUNITY)
Admission: RE | Admit: 2019-11-19 | Discharge: 2019-11-19 | Disposition: A | Discharge status: Home / Self Care | Payer: Medicare Other | Source: Ambulatory Visit | Attending: Orthopaedic Surgery | Admitting: Orthopaedic Surgery

## 2019-11-19 DIAGNOSIS — Z20822 Contact with and (suspected) exposure to covid-19: Secondary | ICD-10-CM | POA: Diagnosis not present

## 2019-11-19 DIAGNOSIS — R9431 Abnormal electrocardiogram [ECG] [EKG]: Secondary | ICD-10-CM | POA: Diagnosis not present

## 2019-11-19 DIAGNOSIS — Z01810 Encounter for preprocedural cardiovascular examination: Secondary | ICD-10-CM | POA: Diagnosis not present

## 2019-11-19 DIAGNOSIS — Z01812 Encounter for preprocedural laboratory examination: Secondary | ICD-10-CM | POA: Diagnosis not present

## 2019-11-19 DIAGNOSIS — Z01818 Encounter for other preprocedural examination: Secondary | ICD-10-CM

## 2019-11-19 LAB — CBC WITH DIFFERENTIAL/PLATELET
Abs Immature Granulocytes: 0.03 10*3/uL (ref 0.00–0.07)
Basophils Absolute: 0.1 10*3/uL (ref 0.0–0.1)
Basophils Relative: 1 %
Eosinophils Absolute: 0.2 10*3/uL (ref 0.0–0.5)
Eosinophils Relative: 2 %
HCT: 39.5 % (ref 36.0–46.0)
Hemoglobin: 13.2 g/dL (ref 12.0–15.0)
Immature Granulocytes: 0 %
Lymphocytes Relative: 32 %
Lymphs Abs: 2.7 10*3/uL (ref 0.7–4.0)
MCH: 30.6 pg (ref 26.0–34.0)
MCHC: 33.4 g/dL (ref 30.0–36.0)
MCV: 91.6 fL (ref 80.0–100.0)
Monocytes Absolute: 0.7 10*3/uL (ref 0.1–1.0)
Monocytes Relative: 8 %
Neutro Abs: 4.8 10*3/uL (ref 1.7–7.7)
Neutrophils Relative %: 57 %
Platelets: 255 10*3/uL (ref 150–400)
RBC: 4.31 MIL/uL (ref 3.87–5.11)
RDW: 13.2 % (ref 11.5–15.5)
WBC: 8.4 10*3/uL (ref 4.0–10.5)
nRBC: 0 % (ref 0.0–0.2)

## 2019-11-19 LAB — ABO/RH: ABO/RH(D): O POS

## 2019-11-19 LAB — URINALYSIS, ROUTINE W REFLEX MICROSCOPIC
Bilirubin Urine: NEGATIVE
Glucose, UA: NEGATIVE mg/dL
Hgb urine dipstick: NEGATIVE
Ketones, ur: NEGATIVE mg/dL
Nitrite: NEGATIVE
Protein, ur: NEGATIVE mg/dL
Specific Gravity, Urine: 1.005 (ref 1.005–1.030)
pH: 7 (ref 5.0–8.0)

## 2019-11-19 LAB — PROTIME-INR
INR: 0.9 (ref 0.8–1.2)
Prothrombin Time: 12.3 seconds (ref 11.4–15.2)

## 2019-11-19 LAB — SURGICAL PCR SCREEN
MRSA, PCR: NEGATIVE
Staphylococcus aureus: NEGATIVE

## 2019-11-19 LAB — SARS CORONAVIRUS 2 (TAT 6-24 HRS): SARS Coronavirus 2: NEGATIVE

## 2019-11-19 LAB — APTT: aPTT: 29 seconds (ref 24–36)

## 2019-11-22 MED ORDER — BUPIVACAINE LIPOSOME 1.3 % IJ SUSP
10.0000 mL | Freq: Once | INTRAMUSCULAR | Status: DC
  Filled 2019-11-22: qty 10

## 2019-11-22 MED ORDER — TRANEXAMIC ACID 1000 MG/10ML IV SOLN
2000.0000 mg | INTRAVENOUS | Status: DC
  Filled 2019-11-22: qty 20

## 2019-11-22 NOTE — H&P (Signed)
TOTAL HIP ADMISSION H&P  Patient is admitted for right total hip arthroplasty.  Subjective:  Chief Complaint: right hip pain  HPI: Rebecca Farley, 64 y.o. female, has a history of pain and functional disability in the right hip(s) due to arthritis and patient has failed non-surgical conservative treatments for greater than 12 weeks to include NSAID's and/or analgesics, corticosteriod injections, flexibility and strengthening excercises, use of assistive devices, weight reduction as appropriate and activity modification.  Onset of symptoms was gradual starting 5 years ago with gradually worsening course since that time.The patient noted no past surgery on the right hip(s).  Patient currently rates pain in the right hip at 10 out of 10 with activity. Patient has night pain, worsening of pain with activity and weight bearing, trendelenberg gait, pain that interfers with activities of daily living and crepitus. Patient has evidence of subchondral cysts, subchondral sclerosis, periarticular osteophytes and joint space narrowing by imaging studies. This condition presents safety issues increasing the risk of falls. There is no current active infection.  Patient Active Problem List   Diagnosis Date Noted  . Genetic testing 06/01/2019  . 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 Family history of colon cancer   . Family history of melanoma   . Family history of ovarian cancer   . Family history of lung cancer   . Family history of prostate cancer   . Family history of liver cancer   . Family history of carrier of genetic disease    Past Medical History:  Diagnosis Date  . Atrial flutter ([place]) 01/2008   RADIOFREQUENCY ABLATION [place]  . Barrett esophagus   . Bee sting allergy   . Colitis   . Diverticulitis   . DJD (degenerative joint disease) of knee    KNEES, BACK (LUMBAR FACET DISEASE) AND HIPS, DALLDORF.  Marland Kitchen Dyspnea    AT REST,ON EXERTION  . Dysrhythmia    AFIB  . Family  history of carrier of genetic disease   . Family history of colon cancer   . Family history of liver cancer   . Family history of lung cancer   . Family history of melanoma   . Family history of ovarian cancer   . Family history of prostate cancer   . GERD (gastroesophageal reflux disease)   . Heart murmur   . Hiatal hernia   . History of syncope    RECURRENT  . Hyperlipidemia    BORDERLINE  . Hypertension   . Migraines   . Obesity   . Osteopenia   . Pneumonia   . S/P RF ablation operation for arrhythmia   . Shoulder tendinitis, left 2008  . Systolic murmur 80/0349   MILD AS, GRADE I DIASTOLIC DYSFUNCTION, ECHO  . Ulcer     Past Surgical History:  Procedure Laterality Date  . ABDOMINAL HYSTERECTOMY    . BREAST BIOPSY Left    10 yrs +  . CESAREAN SECTION     1979, 1984  . CHOLECYSTECTOMY    . COLONOSCOPY  11/03/2012   Procedure: COLONOSCOPY;  Surgeon: Garlan Fair, MD;  Location: [facility];  Service: Endoscopy;  Laterality: N/A;  . ESOPHAGOGASTRODUODENOSCOPY  11/03/2012   Procedure: ESOPHAGOGASTRODUODENOSCOPY (EGD);  Surgeon: Garlan Fair, MD;  Location: [facility];  Service: Endoscopy;  Laterality: N/A;  . implanted loop device      Current Facility-Administered Medications  Medication Dose Route Frequency Provider Last Rate Last Admin  . [START ON 11/23/2019] bupivacaine liposome (EXPAREL)  1.3 % injection 133 mg  10 mL Other Once Melrose Nakayama, MD      . Derrill Memo ON 11/23/2019] tranexamic acid (CYKLOKAPRON) 2,000 mg in sodium chloride 0.9 % 50 mL Topical Application  7,989 mg Topical To OR Melrose Nakayama, MD       Current Outpatient Medications  Medication Sig Dispense Refill Last Dose  . Ascorbic Acid (VITAMIN C WITH ROSE HIPS) 500 MG tablet Take 500 mg by mouth daily.     Marland Kitchen b complex vitamins tablet Take 1 tablet by mouth daily.     . baclofen (LIORESAL) 10 MG tablet Take 10 mg by mouth 2 (two) times daily as needed (for migraines).     . Calcium  Carb-Cholecalciferol (CALCIUM+D3 PO) Take 1 tablet by mouth daily.     Marland Kitchen EPINEPHrine (EPIPEN 2-PAK) 0.3 mg/0.3 mL IJ SOAJ injection Inject 0.3 mg into the muscle as needed for anaphylaxis.     . folic acid (FOLVITE) 211 MCG tablet Take 800 mcg by mouth daily.     . Ginkgo Biloba 120 MG TABS Take 120 mg by mouth daily.     . Glucosamine-Chondroitin (COSAMIN DS PO) Take 1-2 tablets by mouth See admin instructions. Take 2 tablets by mouth in the morning & take 1 tablet by mouth at night.     Marland Kitchen imipramine (TOFRANIL) 25 MG tablet Take 25 mg by mouth at bedtime.      . Multiple Vitamin (MULTIVITAMIN WITH MINERALS) TABS tablet Take 1 tablet by mouth daily.     . Multiple Vitamins-Minerals (EMERGEN-C IMMUNE PLUS) PACK Take 1 packet by mouth daily.     . Omega-3 1000 MG CAPS Take 2,000 mg by mouth daily.     Marland Kitchen omeprazole (PRILOSEC) 20 MG capsule Take 20 mg by mouth daily before breakfast.      . Polyethyl Glycol-Propyl Glycol (LUBRICANT EYE DROPS) 0.4-0.3 % SOLN Place 1 drop into both eyes 3 (three) times daily as needed (dry/irritated eyes.).     Marland Kitchen Turmeric 500 MG TABS Take 1,000 mg by mouth daily.     . valsartan-hydrochlorothiazide (DIOVAN-HCT) 160-12.5 MG tablet Take 1 tablet by mouth daily.      Marland Kitchen aspirin 81 MG chewable tablet Chew 81 mg by mouth 2 (two) times daily.     Marland Kitchen HYDROcodone-acetaminophen (NORCO/VICODIN) 5-325 MG tablet Take 1-2 tablets by mouth every 6 (six) hours as needed for moderate pain.     Marland Kitchen tiZANidine (ZANAFLEX) 4 MG tablet Take 4 mg by mouth every 6 (six) hours as needed (spasms).       Allergies  Allergen Reactions  . Penicillins Anaphylaxis, Hives and Other (See Comments)    Has patient had a PCN reaction causing immediate rash, facial/tongue/throat swelling, SOB or lightheadedness with hypotension: No Has patient had a PCN reaction causing severe rash involving mucus membranes or skin necrosis:  No Has patient had a PCN reaction that required hospitalization No Has patient  had a PCN reaction occurring within the last 10 years: No If all of the above answers are "NO", then may proceed with Cephalosporin use.  . Codeine     FOR ALLERGY: STOMACH SICKNESS  . Mobic [Meloxicam]     DIFFICULT BREATHING  . Nsaids Itching and Nausea And Vomiting    Social History   Tobacco Use  . Smoking status: Never Smoker  . Smokeless tobacco: Never Used  Substance Use Topics  . Alcohol use: Yes    Comment: OCCASIONALY    Family History  Problem  Relation Age of Onset  . Hypertension Mother   . Heart failure Mother   . Aneurysm Mother   . Kidney failure Mother   . Emphysema Father   . Prostate cancer Father   . Lung cancer Father   . Colon cancer Sister 102       dx 3 times; Lynch syndrome (PMS2)  . Melanoma Sister 68  . Other Sister        genetic testing - PMS2+, BRIP1+, MITF+  . Melanoma Sister   . Other Sister        genetic testing - PMS2+  . Idiopathic pulmonary fibrosis Brother   . Lung cancer Maternal Uncle   . Liver cancer Maternal Uncle   . Lung cancer Paternal Uncle   . Ovarian cancer Paternal Grandmother        died early 40s  . Other Sister        genetic testing - BRIP1+, PMS2-, MITF-  . Other Sister        genetic testing - BRIP1+, MITF+, PMS2-  . Colon cancer Brother 47     Review of Systems  Musculoskeletal: Positive for arthralgias.       Right hip   All other systems reviewed and are negative.   Objective:  Physical Exam  Constitutional: She is oriented to person, place, and time. She appears well-developed and well-nourished.  HENT:  Head: Normocephalic and atraumatic.  Eyes: Pupils are equal, round, and reactive to light.  Cardiovascular: Normal rate and regular rhythm.  Respiratory: Effort normal.  GI: Soft.  Musculoskeletal:     Cervical back: Normal range of motion.     Comments: Right hip motion is a little bit limited.  She has a great deal of pain with internal rotation.  Straight leg raise is negative.  Opposite hip  moves well.  Sensation and motor function are intact in her feet with palpable pulses on both sides.   Neurological: She is alert and oriented to person, place, and time.  Skin: Skin is warm and dry.  Psychiatric: She has a normal mood and affect. Her behavior is normal. Judgment and thought content normal.    Vital signs in last 24 hours:    Labs:   Estimated body mass index is 31.57 kg/m as calculated from the following:   Height as of 11/19/19: 5' 5"  (1.651 m).   Weight as of 11/19/19: 86 kg.   Imaging Review Plain radiographs demonstrate severe degenerative joint disease of the right hip(s). The bone quality appears to be good for age and reported activity level.      Assessment/Plan:  End stage primary arthritis, right hip(s)  The patient history, physical examination, clinical judgement of the provider and imaging studies are consistent with end stage degenerative joint disease of the right hip(s) and total hip arthroplasty is deemed medically necessary. The treatment options including medical management, injection therapy, arthroscopy and arthroplasty were discussed at length. The risks and benefits of total hip arthroplasty were presented and reviewed. The risks due to aseptic loosening, infection, stiffness, dislocation/subluxation,  thromboembolic complications and other imponderables were discussed.  The patient acknowledged the explanation, agreed to proceed with the plan and consent was signed. Patient is being admitted for inpatient treatment for surgery, pain control, PT, OT, prophylactic antibiotics, VTE prophylaxis, progressive ambulation and ADL's and discharge planning.The patient is planning to be discharged home with home health services

## 2019-11-22 NOTE — Progress Notes (Signed)
Anesthesia Chart Review   Case: [id number] Date/Time: 11/23/19 1000   Procedure: RIGHT TOTAL HIP ARTHROPLASTY ANTERIOR APPROACH (Right Hip)   Anesthesia type: Spinal   Pre-op diagnosis: right hip degenerative joint disease   [facility] 07 / [facility]   Surgeons: Melrose Nakayama, MD      DISCUSSION:63 y.o. never smoker with h/o HTN, GERD, hiatal hernia, atrial flutter s/p ablation 2009, severe aortic valve stenosis (Echo 04/14/2019 with calculated valve area of 0.89 cm, Peak Grad: 70.1 mmHg, Mean Grad: 38.0 mmHg), right hip djd scheduled for above procedure 11/23/19 with Dr. Melrose Nakayama.   Pt cleared by cardiology 11/01/19.  Per Daune Perch, NP, "Chart reviewed as part of pre-operative protocol coverage. Patient was contacted 11/01/2019 in reference to pre-operative risk assessment for pending surgery as outlined below.  Rebecca Farley was last seen on 06/17/2019 by Dr. Irish Lack.  Since that day, Rebecca Farley has done well. She has had occasional brief lightheadedness that has been stable for several years. She has had no chest pain, shortness of breath, orthopnea, PND, edema or syncope.  Patient has moderate-severe aortic stenosis, asymptomatic and stable with no indication for aortic valve intervention. She will need close hemodynamic monitoring with the procedure, avoiding hypotension. Therefore, based on ACC/AHA guidelines, the patient would be at acceptable risk for the planned procedure without further cardiovascular testing."  Anticipate pt can proceed with planned procedure barring acute status change.   VS: BP 131/86   Pulse 89   Temp 36.7 C (Oral)   Resp 20   Ht 5\' 5"  (1.651 m)   Wt 86 kg   SpO2 98%   BMI 31.57 kg/m   PROVIDERS: Lavone Orn, MD is PCP   Larae Grooms, MD is Cardiologist  LABS: Labs reviewed: Acceptable for surgery. (all labs ordered are listed, but only abnormal results are displayed)  Labs Reviewed  URINALYSIS, ROUTINE W REFLEX MICROSCOPIC  - Abnormal; Notable for the following components:      Result Value   Color, Urine STRAW (*)    Leukocytes,Ua SMALL (*)    Bacteria, UA MANY (*)    All other components within normal limits  SURGICAL PCR SCREEN  APTT  CBC WITH DIFFERENTIAL/PLATELET  PROTIME-INR  TYPE AND SCREEN  ABO/RH     IMAGES:   EKG: 11/19/19  Rate 83 bpm Normal sinus rhythm Possible Left atrial enlargement Minimal voltage criteria for LVH, may be normal variant consider Inferior infarct , age undetermined Abnormal ECG No significant change since last tracing  CV: Echo 04/14/2019 IMPRESSIONS   1. The left ventricle has hyperdynamic systolic function, with an  ejection fraction of >65%. The cavity size was normal. There is mildly  increased left ventricular wall thickness. Left ventricular diastolic  Doppler parameters are consistent with  impaired relaxation. Elevated mean left atrial pressure.  2. The right ventricle has normal systolic function. The cavity was  normal. There is no increase in right ventricular wall thickness. Right  ventricular systolic pressure is normal with an estimated pressure of 22.1  mmHg.  3. Mild thickening of the mitral valve leaflet.  4. The aortic valve is tricuspid. Moderate thickening of the aortic  valve. Moderate calcification of the aortic valve. Aortic valve  regurgitation is trivial by color flow Doppler. Severe stenosis of the  aortic valve.  5. The aortic root and ascending aorta are normal in size and structure Past Medical History:  Diagnosis Date  . Atrial flutter ([place]) 01/2008   [place]IOFRE[place]ENCY [place]  [place]  . Barrett esophagus   . Bee sting allergy   . Colitis   . Diverticulitis   . DJD (degenerative joint disease) of knee    KNEES, BACK (LUMBAR FACET DISEASE) AND HIPS, DALLDORF.  Marland Kitchen Dyspnea    AT REST,ON EXERTION  . Dysrhythmia    AFIB  . Family history of carrier of genetic disease   . Family history of colon cancer   .  Family history of liver cancer   . Family history of lung cancer   . Family history of melanoma   . Family history of ovarian cancer   . Family history of prostate cancer   . GERD (gastroesophageal reflux disease)   . Heart murmur   . Hiatal hernia   . History of syncope    RECURRENT  . Hyperlipidemia    BORDERLINE  . Hypertension   . Migraines   . Obesity   . Osteopenia   . Pneumonia   . S/P RF ablation operation for arrhythmia   . Shoulder tendinitis, left 2008  . Systolic murmur 12/2014   MILD AS, GRADE I DIASTOLIC DYSFUNCTION, ECHO  . Ulcer     Past Surgical History:  Procedure Laterality Date  . ABDOMINAL HYSTERECTOMY    . BREAST BIOPSY Left    10 yrs +  . CESAREAN SECTION     1979, 1984  . CHOLECYSTECTOMY    . COLONOSCOPY  11/03/2012   Procedure: COLONOSCOPY;  Surgeon: Charolett Bumpers, MD;  [facility];  Service: Endoscopy;  Laterality: N/A;  . ESOPHAGOGASTRODUODENOSCOPY  11/03/2012   Procedure: ESOPHAGOGASTRODUODENOSCOPY (EGD);  Surgeon: Charolett Bumpers, MD;  Location: [facility];  Service: Endoscopy;  Laterality: N/A;  . implanted loop device      MEDICATIONS: . Ascorbic Acid (VITAMIN C WITH ROSE HIPS) 500 MG tablet  . aspirin 81 MG chewable tablet  . b complex vitamins tablet  . baclofen (LIORESAL) 10 MG tablet  . Calcium Carb-Cholecalciferol (CALCIUM+D3 PO)  . EPINEPHrine (EPIPEN 2-PAK) 0.3 mg/0.3 mL IJ SOAJ injection  . folic acid (FOLVITE) 800 MCG tablet  . Ginkgo Biloba 120 MG TABS  . Glucosamine-Chondroitin (COSAMIN DS PO)  . HYDROcodone-acetaminophen (NORCO/VICODIN) 5-325 MG tablet  . imipramine (TOFRANIL) 25 MG tablet  . Multiple Vitamin (MULTIVITAMIN WITH MINERALS) TABS tablet  . Multiple Vitamins-Minerals (EMERGEN-C IMMUNE PLUS) PACK  . Omega-3 1000 MG CAPS  . omeprazole (PRILOSEC) 20 MG capsule  . Polyethyl Glycol-Propyl Glycol (LUBRICANT EYE DROPS) 0.4-0.3 % SOLN  . tiZANidine (ZANAFLEX) 4 MG tablet  . Turmeric 500 MG TABS  .  valsartan-hydrochlorothiazide (DIOVAN-HCT) 160-12.5 MG tablet   No current facility-administered medications for this encounter.   Melene Muller ON 11/23/2019] bupivacaine liposome (EXPAREL) 1.3 % injection 133 mg  . [START ON 11/23/2019] tranexamic acid (CYKLOKAPRON) 2,000 mg in sodium chloride 0.9 % 50 mL Topical Application    Janey Genta [facility] Testing [phone] 11/22/19  9:38 AM

## 2019-11-22 NOTE — Anesthesia Preprocedure Evaluation (Addendum)
Anesthesia Evaluation  Patient identified by MRN, date of birth, ID band Patient awake    Reviewed: Allergy & Precautions, NPO status , Patient's Chart, lab work & pertinent test results  History of Anesthesia Complications Negative for: history of anesthetic complications  Airway Mallampati: II  TM Distance: >3 FB Neck ROM: Full    Dental  (+) Edentulous Upper, Dental Advisory Given   Pulmonary neg pulmonary ROS,  11/19/2019 SARS coronavirus NEG   breath sounds clear to auscultation       Cardiovascular hypertension, Pt. on medications (-) angina+ Valvular Problems/Murmurs (severe) AS  Rhythm:Regular Rate:Normal + Systolic murmurs 12/7480 ECHO: EF >65%, Severe stenosis of the aortic valve, with a calculated valve area of 0.89 cm. AV Peak Grad: 70.1 mmHg, AV Mean Grad: 38.0 mmHg     Neuro/Psych  Headaches,    GI/Hepatic Neg liver ROS, GERD  Controlled and Medicated,  Endo/Other  obese  Renal/GU negative Renal ROS     Musculoskeletal  (+) Arthritis ,   Abdominal (+) + obese,   Peds  Hematology negative hematology ROS (+)   Anesthesia Other Findings   Reproductive/Obstetrics                           Anesthesia Physical Anesthesia Plan  ASA: III  Anesthesia Plan: General   Post-op Pain Management:    Induction:   PONV Risk Score and Plan: 4 or greater and Scopolamine patch - Pre-op, Dexamethasone and Ondansetron  Airway Management Planned: LMA  Additional Equipment: Arterial line  Intra-op Plan:   Post-operative Plan: Extubation in OR  Informed Consent: I have reviewed the patients History and Physical, chart, labs and discussed the procedure including the risks, benefits and alternatives for the proposed anesthesia with the patient or authorized representative who has indicated his/her understanding and acceptance.       Plan Discussed with: CRNA and Surgeon  Anesthesia  Plan Comments: (See PAT note 11/19/2019, Jodell Cipro, PA-C)     Anesthesia Quick Evaluation

## 2019-11-23 ENCOUNTER — Ambulatory Visit (HOSPITAL_COMMUNITY): Payer: Medicare Other

## 2019-11-23 ENCOUNTER — Encounter (HOSPITAL_COMMUNITY): Payer: Self-pay | Admitting: Orthopaedic Surgery

## 2019-11-23 ENCOUNTER — Observation Stay (HOSPITAL_COMMUNITY)
Admission: RE | Admit: 2019-11-23 | Discharge: 2019-11-24 | Disposition: A | Discharge status: Home / Self Care | Payer: Medicare Other | Attending: Orthopaedic Surgery | Admitting: Orthopaedic Surgery

## 2019-11-23 ENCOUNTER — Other Ambulatory Visit: Payer: Self-pay

## 2019-11-23 ENCOUNTER — Ambulatory Visit (HOSPITAL_COMMUNITY): Payer: Medicare Other | Admitting: Certified Registered Nurse Anesthetist

## 2019-11-23 ENCOUNTER — Encounter (HOSPITAL_COMMUNITY)
Admission: RE | Disposition: A | Discharge status: Home / Self Care | Payer: Self-pay | Source: Home / Self Care | Attending: Orthopaedic Surgery

## 2019-11-23 ENCOUNTER — Ambulatory Visit (HOSPITAL_COMMUNITY): Payer: Medicare Other | Admitting: Physician Assistant

## 2019-11-23 DIAGNOSIS — Z8249 Family history of ischemic heart disease and other diseases of the circulatory system: Secondary | ICD-10-CM | POA: Insufficient documentation

## 2019-11-23 DIAGNOSIS — Z6831 Body mass index (BMI) 31.0-31.9, adult: Secondary | ICD-10-CM | POA: Insufficient documentation

## 2019-11-23 DIAGNOSIS — I4891 Unspecified atrial fibrillation: Secondary | ICD-10-CM | POA: Diagnosis not present

## 2019-11-23 DIAGNOSIS — Z888 Allergy status to other drugs, medicaments and biological substances status: Secondary | ICD-10-CM | POA: Diagnosis not present

## 2019-11-23 DIAGNOSIS — I10 Essential (primary) hypertension: Secondary | ICD-10-CM | POA: Diagnosis not present

## 2019-11-23 DIAGNOSIS — Z79899 Other long term (current) drug therapy: Secondary | ICD-10-CM | POA: Insufficient documentation

## 2019-11-23 DIAGNOSIS — M1611 Unilateral primary osteoarthritis, right hip: Secondary | ICD-10-CM | POA: Diagnosis not present

## 2019-11-23 DIAGNOSIS — I350 Nonrheumatic aortic (valve) stenosis: Secondary | ICD-10-CM | POA: Diagnosis not present

## 2019-11-23 DIAGNOSIS — Z880 Allergy status to penicillin: Secondary | ICD-10-CM | POA: Diagnosis not present

## 2019-11-23 DIAGNOSIS — R110 Nausea: Secondary | ICD-10-CM | POA: Diagnosis not present

## 2019-11-23 DIAGNOSIS — E669 Obesity, unspecified: Secondary | ICD-10-CM | POA: Insufficient documentation

## 2019-11-23 DIAGNOSIS — M8580 Other specified disorders of bone density and structure, unspecified site: Secondary | ICD-10-CM | POA: Insufficient documentation

## 2019-11-23 DIAGNOSIS — Z886 Allergy status to analgesic agent status: Secondary | ICD-10-CM | POA: Insufficient documentation

## 2019-11-23 DIAGNOSIS — K219 Gastro-esophageal reflux disease without esophagitis: Secondary | ICD-10-CM | POA: Diagnosis not present

## 2019-11-23 DIAGNOSIS — Z419 Encounter for procedure for purposes other than remedying health state, unspecified: Secondary | ICD-10-CM

## 2019-11-23 DIAGNOSIS — Z885 Allergy status to narcotic agent status: Secondary | ICD-10-CM | POA: Insufficient documentation

## 2019-11-23 HISTORY — PX: TOTAL HIP ARTHROPLASTY: SHX124

## 2019-11-23 LAB — TYPE AND SCREEN
ABO/RH(D): O POS
Antibody Screen: NEGATIVE

## 2019-11-23 SURGERY — ARTHROPLASTY, HIP, TOTAL, ANTERIOR APPROACH
Anesthesia: General | Site: Hip | Laterality: Right

## 2019-11-23 MED ORDER — 0.9 % SODIUM CHLORIDE (POUR BTL) OPTIME
TOPICAL | Status: DC | PRN
  Administered 2019-11-23: 1000 mL

## 2019-11-23 MED ORDER — SUGAMMADEX SODIUM 500 MG/5ML IV SOLN
INTRAVENOUS | Status: AC
  Filled 2019-11-23: qty 5

## 2019-11-23 MED ORDER — MIDAZOLAM HCL 5 MG/5ML IJ SOLN
INTRAMUSCULAR | Status: DC | PRN
  Administered 2019-11-23 (×2): 1 mg via INTRAVENOUS

## 2019-11-23 MED ORDER — FENTANYL CITRATE (PF) 250 MCG/5ML IJ SOLN
INTRAMUSCULAR | Status: DC | PRN
  Administered 2019-11-23 (×3): 50 ug via INTRAVENOUS
  Administered 2019-11-23: 100 ug via INTRAVENOUS
  Administered 2019-11-23 (×2): 50 ug via INTRAVENOUS

## 2019-11-23 MED ORDER — ASPIRIN 81 MG PO CHEW
81.0000 mg | CHEWABLE_TABLET | Freq: Two times a day (BID) | ORAL | Status: DC
  Administered 2019-11-24: 81 mg via ORAL
  Filled 2019-11-23: qty 1

## 2019-11-23 MED ORDER — MENTHOL 3 MG MT LOZG: 1.0000 | LOZENGE | OROMUCOSAL | Status: DC | PRN

## 2019-11-23 MED ORDER — MEPERIDINE HCL 50 MG/ML IJ SOLN: 6.2500 mg | INTRAMUSCULAR | Status: DC | PRN

## 2019-11-23 MED ORDER — BACLOFEN 10 MG PO TABS: 10.0000 mg | ORAL_TABLET | Freq: Two times a day (BID) | ORAL | Status: DC | PRN

## 2019-11-23 MED ORDER — METHOCARBAMOL 500 MG PO TABS
500.0000 mg | ORAL_TABLET | Freq: Four times a day (QID) | ORAL | Status: DC | PRN
  Administered 2019-11-24: 500 mg via ORAL
  Filled 2019-11-23: qty 1

## 2019-11-23 MED ORDER — TRANEXAMIC ACID-NACL 1000-0.7 MG/100ML-% IV SOLN
1000.0000 mg | INTRAVENOUS | Status: AC
  Administered 2019-11-23: 1000 mg via INTRAVENOUS
  Filled 2019-11-23: qty 100

## 2019-11-23 MED ORDER — POVIDONE-IODINE 10 % EX SWAB
2.0000 "application " | Freq: Once | CUTANEOUS | Status: AC
  Administered 2019-11-23: 2 via TOPICAL

## 2019-11-23 MED ORDER — PHENOL 1.4 % MT LIQD
1.0000 | OROMUCOSAL | Status: DC | PRN
  Filled 2019-11-23: qty 177

## 2019-11-23 MED ORDER — ALBUMIN HUMAN 5 % IV SOLN
INTRAVENOUS | Status: AC
  Filled 2019-11-23: qty 250

## 2019-11-23 MED ORDER — ONDANSETRON HCL 4 MG PO TABS: 4.0000 mg | ORAL_TABLET | Freq: Four times a day (QID) | ORAL | Status: DC | PRN

## 2019-11-23 MED ORDER — LACTATED RINGERS IV SOLN: INTRAVENOUS | Status: DC

## 2019-11-23 MED ORDER — HYDROCODONE-ACETAMINOPHEN 7.5-325 MG PO TABS
1.0000 | ORAL_TABLET | ORAL | Status: DC | PRN
  Administered 2019-11-23: 1 via ORAL
  Filled 2019-11-23: qty 1

## 2019-11-23 MED ORDER — METOCLOPRAMIDE HCL 5 MG PO TABS: 5.0000 mg | ORAL_TABLET | Freq: Three times a day (TID) | ORAL | Status: DC | PRN

## 2019-11-23 MED ORDER — FENTANYL CITRATE (PF) 100 MCG/2ML IJ SOLN
INTRAMUSCULAR | Status: AC
  Filled 2019-11-23: qty 2

## 2019-11-23 MED ORDER — HYDROMORPHONE HCL 1 MG/ML IJ SOLN
INTRAMUSCULAR | Status: AC
  Filled 2019-11-23: qty 2

## 2019-11-23 MED ORDER — EPINEPHRINE 0.3 MG/0.3ML IJ SOAJ
0.3000 mg | INTRAMUSCULAR | Status: DC | PRN
  Filled 2019-11-23: qty 0.6

## 2019-11-23 MED ORDER — ONDANSETRON HCL 4 MG/2ML IJ SOLN
INTRAMUSCULAR | Status: DC | PRN
  Administered 2019-11-23: 4 mg via INTRAVENOUS

## 2019-11-23 MED ORDER — ACETAMINOPHEN 500 MG PO TABS
500.0000 mg | ORAL_TABLET | Freq: Four times a day (QID) | ORAL | Status: DC
  Administered 2019-11-23 – 2019-11-24 (×3): 500 mg via ORAL
  Filled 2019-11-23 (×3): qty 1

## 2019-11-23 MED ORDER — PHENYLEPHRINE 40 MCG/ML (10ML) SYRINGE FOR IV PUSH (FOR BLOOD PRESSURE SUPPORT)
PREFILLED_SYRINGE | INTRAVENOUS | Status: DC | PRN
  Administered 2019-11-23: 40 ug via INTRAVENOUS
  Administered 2019-11-23: 80 ug via INTRAVENOUS

## 2019-11-23 MED ORDER — DIPHENHYDRAMINE HCL 12.5 MG/5ML PO ELIX: 12.5000 mg | ORAL_SOLUTION | ORAL | Status: DC | PRN

## 2019-11-23 MED ORDER — ACETAMINOPHEN 500 MG PO TABS
1000.0000 mg | ORAL_TABLET | Freq: Once | ORAL | Status: AC
  Administered 2019-11-23: 1000 mg via ORAL
  Filled 2019-11-23: qty 2

## 2019-11-23 MED ORDER — PROMETHAZINE HCL 25 MG/ML IJ SOLN: 6.2500 mg | INTRAMUSCULAR | Status: DC | PRN

## 2019-11-23 MED ORDER — ACETAMINOPHEN 325 MG PO TABS: 325.0000 mg | ORAL_TABLET | Freq: Four times a day (QID) | ORAL | Status: DC | PRN

## 2019-11-23 MED ORDER — HYDROMORPHONE HCL 1 MG/ML IJ SOLN
INTRAMUSCULAR | Status: AC
  Filled 2019-11-23: qty 1

## 2019-11-23 MED ORDER — BUPIVACAINE LIPOSOME 1.3 % IJ SUSP
INTRAMUSCULAR | Status: DC | PRN
  Administered 2019-11-23: 10 mL

## 2019-11-23 MED ORDER — MORPHINE SULFATE (PF) 2 MG/ML IV SOLN: 0.5000 mg | INTRAVENOUS | Status: DC | PRN

## 2019-11-23 MED ORDER — VANCOMYCIN HCL IN DEXTROSE 1-5 GM/200ML-% IV SOLN
1000.0000 mg | Freq: Two times a day (BID) | INTRAVENOUS | Status: AC
  Administered 2019-11-23: 1000 mg via INTRAVENOUS
  Filled 2019-11-23: qty 200

## 2019-11-23 MED ORDER — PHENYLEPHRINE HCL-NACL 10-0.9 MG/250ML-% IV SOLN
INTRAVENOUS | Status: DC | PRN
  Administered 2019-11-23: 40 ug/min via INTRAVENOUS

## 2019-11-23 MED ORDER — ROCURONIUM BROMIDE 50 MG/5ML IV SOSY
PREFILLED_SYRINGE | INTRAVENOUS | Status: DC | PRN
  Administered 2019-11-23: 60 mg via INTRAVENOUS
  Administered 2019-11-23: 10 mg via INTRAVENOUS

## 2019-11-23 MED ORDER — BUPIVACAINE HCL 0.25 % IJ SOLN
INTRAMUSCULAR | Status: DC | PRN
  Administered 2019-11-23: 30 mL

## 2019-11-23 MED ORDER — LIDOCAINE 2% (20 MG/ML) 5 ML SYRINGE
INTRAMUSCULAR | Status: DC | PRN
  Administered 2019-11-23: 40 mg via INTRAVENOUS

## 2019-11-23 MED ORDER — LACTATED RINGERS IV BOLUS
500.0000 mL | Freq: Once | INTRAVENOUS | Status: AC
  Administered 2019-11-23: 16:00:00 500 mL via INTRAVENOUS

## 2019-11-23 MED ORDER — FENTANYL CITRATE (PF) 250 MCG/5ML IJ SOLN
INTRAMUSCULAR | Status: AC
  Filled 2019-11-23: qty 5

## 2019-11-23 MED ORDER — SUGAMMADEX SODIUM 500 MG/5ML IV SOLN
INTRAVENOUS | Status: DC | PRN
  Administered 2019-11-23: 250 mg via INTRAVENOUS

## 2019-11-23 MED ORDER — IRBESARTAN 150 MG PO TABS
150.0000 mg | ORAL_TABLET | Freq: Every day | ORAL | Status: DC
  Administered 2019-11-23: 150 mg via ORAL
  Filled 2019-11-23 (×2): qty 1

## 2019-11-23 MED ORDER — METHOCARBAMOL 500 MG IVPB - SIMPLE MED
500.0000 mg | Freq: Four times a day (QID) | INTRAVENOUS | Status: DC | PRN
  Filled 2019-11-23: qty 50

## 2019-11-23 MED ORDER — TRANEXAMIC ACID 1000 MG/10ML IV SOLN
INTRAVENOUS | Status: DC | PRN
  Administered 2019-11-23: 2000 mg via TOPICAL

## 2019-11-23 MED ORDER — VALSARTAN-HYDROCHLOROTHIAZIDE 160-12.5 MG PO TABS: 1.0000 | ORAL_TABLET | Freq: Every day | ORAL | Status: DC

## 2019-11-23 MED ORDER — ALUM & MAG HYDROXIDE-SIMETH 200-200-20 MG/5ML PO SUSP: 30.0000 mL | ORAL | Status: DC | PRN

## 2019-11-23 MED ORDER — DEXAMETHASONE SODIUM PHOSPHATE 10 MG/ML IJ SOLN
INTRAMUSCULAR | Status: DC | PRN
  Administered 2019-11-23: 6 mg via INTRAVENOUS

## 2019-11-23 MED ORDER — ONDANSETRON HCL 4 MG/2ML IJ SOLN
INTRAMUSCULAR | Status: AC
  Filled 2019-11-23: qty 2

## 2019-11-23 MED ORDER — MIDAZOLAM HCL 2 MG/2ML IJ SOLN
INTRAMUSCULAR | Status: AC
  Filled 2019-11-23: qty 2

## 2019-11-23 MED ORDER — VANCOMYCIN HCL IN DEXTROSE 1-5 GM/200ML-% IV SOLN
1000.0000 mg | INTRAVENOUS | Status: AC
  Administered 2019-11-23: 1000 mg via INTRAVENOUS
  Filled 2019-11-23: qty 200

## 2019-11-23 MED ORDER — ALBUMIN HUMAN 5 % IV SOLN: INTRAVENOUS | Status: DC | PRN

## 2019-11-23 MED ORDER — LACTATED RINGERS IV SOLN
INTRAVENOUS | Status: DC
  Administered 2019-11-23: 1000 mL via INTRAVENOUS

## 2019-11-23 MED ORDER — PANTOPRAZOLE SODIUM 40 MG PO TBEC
40.0000 mg | DELAYED_RELEASE_TABLET | Freq: Every day | ORAL | Status: DC
  Administered 2019-11-24: 40 mg via ORAL
  Filled 2019-11-23: qty 1

## 2019-11-23 MED ORDER — HYDROCHLOROTHIAZIDE 12.5 MG PO CAPS
12.5000 mg | ORAL_CAPSULE | Freq: Every day | ORAL | Status: DC
  Filled 2019-11-23 (×2): qty 1

## 2019-11-23 MED ORDER — TRANEXAMIC ACID-NACL 1000-0.7 MG/100ML-% IV SOLN
1000.0000 mg | Freq: Once | INTRAVENOUS | Status: AC
  Administered 2019-11-23: 1000 mg via INTRAVENOUS
  Filled 2019-11-23: qty 100

## 2019-11-23 MED ORDER — IMIPRAMINE HCL 25 MG PO TABS
25.0000 mg | ORAL_TABLET | Freq: Every day | ORAL | Status: DC
  Administered 2019-11-23: 25 mg via ORAL
  Filled 2019-11-23 (×2): qty 1

## 2019-11-23 MED ORDER — BISACODYL 5 MG PO TBEC: 5.0000 mg | DELAYED_RELEASE_TABLET | Freq: Every day | ORAL | Status: DC | PRN

## 2019-11-23 MED ORDER — PROPOFOL 10 MG/ML IV BOLUS
INTRAVENOUS | Status: AC
  Filled 2019-11-23: qty 40

## 2019-11-23 MED ORDER — METOCLOPRAMIDE HCL 5 MG/ML IJ SOLN
5.0000 mg | Freq: Three times a day (TID) | INTRAMUSCULAR | Status: DC | PRN
  Administered 2019-11-23: 10 mg via INTRAVENOUS
  Filled 2019-11-23: qty 2

## 2019-11-23 MED ORDER — LACTATED RINGERS IV BOLUS
250.0000 mL | Freq: Once | INTRAVENOUS | Status: AC
  Administered 2019-11-23: 18:00:00 250 mL via INTRAVENOUS

## 2019-11-23 MED ORDER — SCOPOLAMINE 1 MG/3DAYS TD PT72
1.0000 | MEDICATED_PATCH | Freq: Once | TRANSDERMAL | Status: DC
  Administered 2019-11-23: 1.5 mg via TRANSDERMAL
  Filled 2019-11-23: qty 1

## 2019-11-23 MED ORDER — CHLORHEXIDINE GLUCONATE 4 % EX LIQD: 60.0000 mL | Freq: Once | CUTANEOUS | Status: DC

## 2019-11-23 MED ORDER — BUPIVACAINE HCL 0.25 % IJ SOLN
INTRAMUSCULAR | Status: AC
  Filled 2019-11-23: qty 1

## 2019-11-23 MED ORDER — DOCUSATE SODIUM 100 MG PO CAPS
100.0000 mg | ORAL_CAPSULE | Freq: Two times a day (BID) | ORAL | Status: DC
  Administered 2019-11-23 – 2019-11-24 (×2): 100 mg via ORAL
  Filled 2019-11-23 (×2): qty 1

## 2019-11-23 MED ORDER — LACTATED RINGERS IV BOLUS: 250.0000 mL | Freq: Once | INTRAVENOUS | Status: DC

## 2019-11-23 MED ORDER — MIDAZOLAM HCL 2 MG/2ML IJ SOLN: 0.5000 mg | Freq: Once | INTRAMUSCULAR | Status: DC | PRN

## 2019-11-23 MED ORDER — PROPOFOL 10 MG/ML IV BOLUS
INTRAVENOUS | Status: DC | PRN
  Administered 2019-11-23: 120 mg via INTRAVENOUS

## 2019-11-23 MED ORDER — ONDANSETRON HCL 4 MG/2ML IJ SOLN
4.0000 mg | Freq: Four times a day (QID) | INTRAMUSCULAR | Status: DC | PRN
  Administered 2019-11-24: 4 mg via INTRAVENOUS
  Filled 2019-11-23: qty 2

## 2019-11-23 MED ORDER — HYDROCODONE-ACETAMINOPHEN 5-325 MG PO TABS
1.0000 | ORAL_TABLET | ORAL | Status: DC | PRN
  Administered 2019-11-24: 10:00:00 1 via ORAL
  Filled 2019-11-23: qty 1

## 2019-11-23 MED ORDER — DEXAMETHASONE SODIUM PHOSPHATE 10 MG/ML IJ SOLN
INTRAMUSCULAR | Status: AC
  Filled 2019-11-23: qty 1

## 2019-11-23 MED ORDER — HYDROMORPHONE HCL 1 MG/ML IJ SOLN
0.2500 mg | INTRAMUSCULAR | Status: DC | PRN
  Administered 2019-11-23 (×4): 0.5 mg via INTRAVENOUS

## 2019-11-23 SURGICAL SUPPLY — 43 items
BAG DECANTER FOR FLEXI CONT (MISCELLANEOUS) ×2 IMPLANT
BALL HIP CERAMIC (Hips) ×1 IMPLANT
BLADE SAW SGTL 18X1.27X75 (BLADE) ×2 IMPLANT
BOOTIES KNEE HIGH SLOAN (MISCELLANEOUS) ×2 IMPLANT
CELLS DAT CNTRL 66122 CELL SVR (MISCELLANEOUS) ×1 IMPLANT
COVER PERINEAL POST (MISCELLANEOUS) ×2 IMPLANT
COVER SURGICAL LIGHT HANDLE (MISCELLANEOUS) ×2 IMPLANT
COVER WAND RF STERILE (DRAPES) IMPLANT
CUP GRIPTON 48MM 100 HIP (Hips) ×2 IMPLANT
DECANTER SPIKE VIAL GLASS SM (MISCELLANEOUS) IMPLANT
DRAPE IMP U-DRAPE 54X76 (DRAPES) ×2 IMPLANT
DRAPE STERI IOBAN 125X83 (DRAPES) ×2 IMPLANT
DRAPE U-SHAPE 47X51 STRL (DRAPES) ×4 IMPLANT
DRSG AQUACEL AG ADV 3.5X 6 (GAUZE/BANDAGES/DRESSINGS) ×2 IMPLANT
DURAPREP 26ML APPLICATOR (WOUND CARE) ×2 IMPLANT
ELECT BLADE TIP CTD 4 INCH (ELECTRODE) ×2 IMPLANT
ELECT REM PT RETURN 15FT ADLT (MISCELLANEOUS) ×2 IMPLANT
ELIMINATOR HOLE APEX DEPUY (Hips) ×2 IMPLANT
GLOVE BIO SURGEON STRL SZ8 (GLOVE) ×4 IMPLANT
GLOVE BIOGEL PI IND STRL 8 (GLOVE) ×2 IMPLANT
GLOVE BIOGEL PI INDICATOR 8 (GLOVE) ×2
GOWN STRL REUS W/TWL XL LVL3 (GOWN DISPOSABLE) ×4 IMPLANT
HIP BALL CERAMIC (Hips) ×2 IMPLANT
HOLDER FOLEY CATH W/STRAP (MISCELLANEOUS) IMPLANT
KIT TURNOVER KIT A (KITS) IMPLANT
LINER ACET 32X48 (Liner) ×2 IMPLANT
MANIFOLD NEPTUNE II (INSTRUMENTS) ×2 IMPLANT
NEEDLE HYPO 22GX1.5 SAFETY (NEEDLE) ×2 IMPLANT
NS IRRIG 1000ML POUR BTL (IV SOLUTION) ×2 IMPLANT
PACK ANTERIOR HIP CUSTOM (KITS) ×2 IMPLANT
PENCIL SMOKE EVACUATOR (MISCELLANEOUS) IMPLANT
PROTECTOR NERVE ULNAR (MISCELLANEOUS) ×2 IMPLANT
RTRCTR WOUND ALEXIS 18CM MED (MISCELLANEOUS) ×2
STEM FEM ACTIS STD SZ7 (Nail) ×2 IMPLANT
SUT ETHIBOND NAB CT1 #1 30IN (SUTURE) ×4 IMPLANT
SUT VIC AB 1 CT1 36 (SUTURE) ×2 IMPLANT
SUT VIC AB 2-0 CT1 27 (SUTURE) ×1
SUT VIC AB 2-0 CT1 TAPERPNT 27 (SUTURE) ×1 IMPLANT
SUT VICRYL AB 3-0 FS1 BRD 27IN (SUTURE) ×2 IMPLANT
SUT VLOC 180 0 24IN GS25 (SUTURE) ×2 IMPLANT
SYR 50ML LL SCALE MARK (SYRINGE) ×2 IMPLANT
TRAY FOLEY MTR SLVR 16FR STAT (SET/KITS/TRAYS/PACK) IMPLANT
YANKAUER SUCT BULB TIP 10FT TU (MISCELLANEOUS) ×2 IMPLANT

## 2019-11-23 NOTE — Anesthesia Procedure Notes (Signed)
Arterial Line Insertion Start/End2/16/2021 10:19 AM, 11/23/2019 10:30 AM Performed by: Jairo Ben, MD, Wynonia Sours, CRNA, anesthesiologist  Patient location: Pre-op. Preanesthetic checklist: patient identified, IV checked, site marked, risks and benefits discussed, surgical consent, monitors and equipment checked, pre-op evaluation, timeout performed and anesthesia consent Lidocaine 1% used for infiltration Left, radial was placed Catheter size: 20 G Hand hygiene performed  and Seldinger technique used Richert's test indicative of satisfactory collateral circulation Attempts: 1 Procedure performed without using ultrasound guided technique. Following insertion, dressing applied and Biopatch. Patient tolerated the procedure well with no immediate complications.

## 2019-11-23 NOTE — Progress Notes (Signed)
Patient experienced nausea and vomiting 1 hour after administration of hydrocodone-acetaminophen.  Administered anti-emetic medication.   Advised patient to talk with physician about pain management regimen.    S[place]emore, Charity fundraiser

## 2019-11-23 NOTE — Op Note (Signed)
PRE-OP DIAGNOSIS:  RIGHT HIP DEGENERATIVE JOINT DISEASE POST-OP DIAGNOSIS:  same PROCEDURE: RIGHT TOTAL HIP ARTHROPLASTY ANTERIOR APPROACH ANESTHESIA:  General SURGEON:  Marcene Corning MD ASSISTANT:  Elodia Florence PA-C   INDICATIONS FOR PROCEDURE:  The patient is a 64 y.o. female with a long history of a painful hip.  This has persisted despite multiple conservative measures.  The patient has persisted with pain and dysfunction making rest and activity difficult.  A total hip replacement is offered as surgical treatment.  Informed operative consent was obtained after discussion of possible complications including reaction to anesthesia, infection, neurovascular injury, dislocation, DVT, PE, and death.  The importance of the postoperative rehab program to optimize result was stressed with the patient.  SUMMARY OF FINDINGS AND PROCEDURE:  Under the above anesthesia (genral due to aortic stenosis) through a anterior approach an the Hana table a right THR was performed.  The patient had severe degenerative change and excellent bone quality.  We used DePuy components to replace the hip and these were size 7 standard Actis femur capped with a +5 31mm ceramic hip ball.  On the acetabular side we used a size 48 Gription shell with a  plus 0 neutral polyethylene liner.  We did use a hole eliminator.  Elodia Florence PA-C assisted throughout and was invaluable to the completion of the case in that he helped position and retract while I performed the procedure.  He also closed simultaneously to help minimize OR time.  I used fluoroscopy throughout the case to check position of implants and leg lengths and read all of these views myself.  DESCRIPTION OF PROCEDURE:  The patient was taken to the OR suite where the above anesthetic was applied.  The patient was then positioned on the Hana table supine.  All bony prominences were appropriately padded.  Prep and drape was then performed in normal sterile fashion.  The patient  was given vancomycin preoperative antibiotic and an appropriate time out was performed.  We then took an anterior approach to the right hip.  Dissection was taken through adipose to the tensor fascia lata fascia.  This structure was incised longitudinally and we dissected in the intermuscular interval just medial to this muscle.  Cobra retractors were placed superior and inferior to the femoral neck superficial to the capsule.  A capsular incision was then made and the retractors were placed along the femoral neck.  Xray was brought in to get a good level for the femoral neck cut which was made with an oscillating saw and osteotome.  The femoral head was removed with a corkscrew.  The acetabulum was exposed and some labral tissues were excised. Reaming was taken to the inside wall of the pelvis and sequentially up to 1 mm smaller than the actual component.  A trial of components was done and then the aforementioned acetabular shell was placed in appropriate tilt and anteversion confirmed by fluoroscopy. The liner was placed along with the hole eliminator and attention was turned to the femur.  The leg was brought down and over into adduction and the elevator bar was used to raise the femur up gently in the wound.  The piriformis was released with care taken to preserve the obturator internus attachment and all of the posterior capsule. The femur was reamed and then broached to the appropriate size.  A trial reduction was done and the aforementioned head and neck assembly gave [place] the best stability in extension with external rotation.  Leg lengths were felt  to be about equal by fluoroscopic exam.  The trial components were removed and the wound irrigated.  We then placed the femoral component in appropriate anteversion.  The head was applied to a dry stem neck and the hip again reduced.  It was again stable in the aforementioned position.  The would was irrigated again followed by re-approximation of anterior capsule  with ethibond suture. Tensor fascia was repaired with V-loc suture  followed by deep closure with #O and #2 undyed vicryl.  Skin was closed with subQ stitch and steristrips followed by a sterile dressing.  EBL and IOF can be obtained from anesthesia records.  DISPOSITION:  The patient was extubated in the OR and taken to PACU in stable condition to be admitted to the Orthopedic Surgery for appropriate post-op care to include perioperative antibiotics and DVT prophylaxis.

## 2019-11-23 NOTE — Transfer of Care (Signed)
Immediate Anesthesia Transfer of Care Note  Patient: Rebecca Farley  Procedure(s) Performed: RIGHT TOTAL HIP ARTHROPLASTY ANTERIOR APPROACH (Right Hip)  Patient Location: PACU  Anesthesia Type:General  Level of Consciousness: awake, alert , oriented and patient cooperative  Airway & Oxygen Therapy: Patient Spontanous Breathing and Patient connected to face mask oxygen  Post-op Assessment: Report given to RN and Post -op Vital signs reviewed and stable  Post vital signs: Reviewed and stable  Last Vitals:  Vitals Value Taken Time  BP 166/97 11/23/19 1237  Temp    Pulse 89 11/23/19 1245  Resp 11 11/23/19 1245  SpO2 100 % 11/23/19 1245  Vitals shown include unvalidated device data.  Last Pain:  Vitals:   11/23/19 0757  TempSrc: Oral         Complications: No apparent anesthesia complications

## 2019-11-23 NOTE — Anesthesia Procedure Notes (Addendum)
Procedure Name: Intubation Date/Time: 11/23/2019 10:53 AM Performed by: West Pugh, CRNA Pre-anesthesia Checklist: Patient identified, Emergency Drugs available, Suction available, Patient being monitored and Timeout performed Patient Re-evaluated:Patient Re-evaluated prior to induction Oxygen Delivery Method: Circle system utilized Preoxygenation: Pre-oxygenation with 100% oxygen Induction Type: IV induction Ventilation: Mask ventilation without difficulty Laryngoscope Size: Mac and 3 Grade View: Grade I Tube type: Oral Number of attempts: 1 Airway Equipment and Method: Stylet Placement Confirmation: ETT inserted through vocal cords under direct vision,  positive ETCO2,  CO2 detector and breath sounds checked- equal and bilateral Secured at: 21 cm Tube secured with: Tape Dental Injury: Teeth and Oropharynx as per pre-operative assessment

## 2019-11-23 NOTE — Evaluation (Signed)
Physical Therapy Evaluation Patient Details Name: Rebecca Farley MRN: [id number] DOB: 1956-05-20 Today's Date: 11/23/2019   History of Present Illness  64 yo female s/p R THA 11/23/19.  Clinical Impression  On eval POD 0, pt required Min assist for mobility. She walked ~40 feet with a RW. Initially, pt reported some dizziness and nausea after getting off [facility] with nursing. After resting for a bit, pt was able to walk in the hallway. Will continue to follow and progress activity as tolerated. Plan is for d/c home on tomorrow if all goes well.     Follow Up Recommendations Follow surgeon's recommendation for DC plan and follow-up therapies    Equipment Recommendations  None recommended by PT    Recommendations for Other Services       Precautions / Restrictions Precautions Precautions: Fall Restrictions Weight Bearing Restrictions: No Other Position/Activity Restrictions: WBAT      Mobility  Bed Mobility Overal bed mobility: Needs Assistance Bed Mobility: Supine to Sit;Sit to Supine     Supine to sit: Min assist;HOB elevated Sit to supine: Min assist;HOB elevated   General bed mobility comments: Pt sitting EOB after getting [facility] with nursing. When therapist arrived, pt c/o dizziness and nausea. Assisted pt back to bed to rest  for a few minutes. BP 134/78. After a few mintues, pt was agreeable to attempt mobilizing again.  Transfers Overall transfer level: Needs assistance Equipment used: Rolling walker (2 wheeled) Transfers: Sit to/from Stand Sit to Stand: Min assist         General transfer comment: Small amount of assist to steady. VCs safety, hand placement.  Ambulation/Gait Ambulation/Gait assistance: Min assist Gait Distance (Feet): 40 Feet Assistive device: Rolling walker (2 wheeled) Gait Pattern/deviations: Step-to pattern;Step-through pattern;Decreased stride length     General Gait Details: Assist to steady. Followed with reclilner for safety. Pt fatigued  fairly easily on today. Transported back to room in recliner.  Stairs            Wheelchair Mobility    Modified Rankin (Stroke Patients Only)       Balance                                             Pertinent Vitals/Pain Pain Assessment: 0-10 Pain Score: 6  Pain Location: R hip/thigh Pain Descriptors / Indicators: Discomfort;Sore Pain Intervention(s): Monitored during session;Repositioned;Ice applied    Home Living Family/patient expects to be discharged to:: Private residence Living Arrangements: Children Available Help at Discharge: Family Type of Home: Apartment Home Access: Stairs to enter Entrance Stairs-Rails: Right Entrance Stairs-Number of Steps: 1 flight Home Layout: One level Home Equipment: Environmental consultant - 2 wheels;Cane - single point;Bedside commode      Prior Function Level of Independence: Independent               Hand Dominance        Extremity/Trunk Assessment   Upper Extremity Assessment Upper Extremity Assessment: Overall WFL for tasks assessed    Lower Extremity Assessment Lower Extremity Assessment: Generalized weakness    Cervical / Trunk Assessment Cervical / Trunk Assessment: Normal  Communication   Communication: No difficulties  Cognition Arousal/Alertness: Awake/alert Behavior During Therapy: WFL for tasks assessed/performed Overall Cognitive Status: Within Functional Limits for tasks assessed  General Comments      Exercises     Assessment/Plan    PT Assessment Patient needs continued PT services  PT Problem List Decreased strength;Decreased mobility;Decreased range of motion;Decreased activity tolerance;Decreased balance;Decreased knowledge of use of DME;Pain       PT Treatment Interventions DME instruction;Gait training;Therapeutic activities;Therapeutic exercise;Patient/family education;Functional mobility training;Balance  training;Stair training    PT Goals (Current goals can be found in the Care Plan section)  Acute Rehab PT Goals Patient Stated Goal: regain PLOF PT Goal Formulation: With patient Time For Goal Achievement: 12/07/19 Potential to Achieve Goals: Good    Frequency 7X/week   Barriers to discharge        Co-evaluation               AM-PAC PT "6 Clicks" Mobility  Outcome Measure Help needed turning from your back to your side while in a flat bed without using bedrails?: A Little Help needed moving from lying on your back to sitting on the side of a flat bed without using bedrails?: A Little Help needed moving to and from a bed to a chair (including a wheelchair)?: A Little Help needed standing up from a chair using your arms (e.g., wheelchair or bedside chair)?: A Little Help needed to walk in hospital room?: A Little Help needed climbing 3-5 steps with a railing? : A Little 6 Click Score: 18    End of Session Equipment Utilized During Treatment: Gait belt Activity Tolerance: Patient tolerated treatment well Patient left: in chair;with call bell/phone within reach;with family/visitor present   PT Visit Diagnosis: Pain;Other abnormalities of gait and mobility (R26.89) Pain - Right/Left: Right Pain - part of body: Hip    Time: 6812-7517 PT Time Calculation (min) (ACUTE ONLY): 22 min   Charges:   PT Evaluation $PT Eval Low Complexity: 1 Low            Suki Crockett P, PT Acute Rehabilitation

## 2019-11-23 NOTE — Anesthesia Postprocedure Evaluation (Signed)
Anesthesia Post Note  Patient: ZAHLIA DESHAZER  Procedure(s) Performed: RIGHT TOTAL HIP ARTHROPLASTY ANTERIOR APPROACH (Right Hip)     Patient location during evaluation: PACU Anesthesia Type: General Level of consciousness: awake and alert and oriented Pain management: pain level controlled Vital Signs Assessment: post-procedure vital signs reviewed and stable Respiratory status: spontaneous breathing, nonlabored ventilation and respiratory function stable Cardiovascular status: blood pressure returned to baseline and stable Postop Assessment: no apparent nausea or vomiting Anesthetic complications: no    Last Vitals:  Vitals:   11/23/19 1237 11/23/19 1245  BP: (!) 166/97 (!) 152/89  Pulse: 90 89  Resp: 19 11  Temp: (!) 36.4 C   SpO2: 100% 100%    Last Pain:  Vitals:   11/23/19 1330  TempSrc:   PainSc: Asleep                 Jerald Hennington A.

## 2019-11-23 NOTE — Interval H&P Note (Signed)
History and Physical Interval Note:  11/23/2019 8:47 AM  Rebecca Farley  has presented today for surgery, with the diagnosis of right hip degenerative joint disease.  The various methods of treatment have been discussed with the patient and family. After consideration of risks, benefits and other options for treatment, the patient has consented to  Procedure(s): RIGHT TOTAL HIP ARTHROPLASTY ANTERIOR APPROACH (Right) as a surgical intervention.  The patient's history has been reviewed, patient examined, no change in status, stable for surgery.  I have reviewed the patient's chart and labs.  Questions were answered to the patient's satisfaction.     Velna Ochs

## 2019-11-23 NOTE — Plan of Care (Signed)
Plan of care for post-op day 0 discussed with patient.   All questions answered at this time.   Will continue to monitor patient.    S[place], Charity fundraiser

## 2019-11-24 DIAGNOSIS — M1611 Unilateral primary osteoarthritis, right hip: Secondary | ICD-10-CM | POA: Diagnosis not present

## 2019-11-24 MED ORDER — PROMETHAZINE HCL 12.5 MG PO TABS: 12.5000 mg | ORAL_TABLET | Freq: Four times a day (QID) | ORAL | 1 refills | Status: DC | PRN

## 2019-11-24 MED FILL — PROMETHAZINE 12.5 MG TABLET: 12.5 | 4 days supply | Qty: 30 | Fill #0

## 2019-11-24 NOTE — Discharge Summary (Signed)
Patient ID: Rebecca Farley MRN: [id number] DOB/AGE: 66-Jul-1957 66 y.o.  Admit date: 11/23/2019 Discharge date: 11/24/2019  Admission Diagnoses:  Principal Problem:   Primary localized osteoarthritis of right hip Active Problems:   Primary osteoarthritis of right hip   Discharge Diagnoses:  Same  Past Medical History:  Diagnosis Date  . Atrial flutter (HCC) 01/2008   RADIOFREQUENCY ABLATION [facility]  . Barrett esophagus   . Bee sting allergy   . Colitis   . Diverticulitis   . DJD (degenerative joint disease) of knee    KNEES, BACK (LUMBAR FACET DISEASE) AND HIPS, DALLDORF.  Marland Kitchen Dyspnea    AT REST,ON EXERTION  . Dysrhythmia    AFIB  . Family history of carrier of genetic disease   . Family history of colon cancer   . Family history of liver cancer   . Family history of lung cancer   . Family history of melanoma   . Family history of ovarian cancer   . Family history of prostate cancer   . GERD (gastroesophageal reflux disease)   . Heart murmur   . Hiatal hernia   . History of syncope    RECURRENT  . Hyperlipidemia    BORDERLINE  . Hypertension   . Migraines   . Obesity   . Osteopenia   . Pneumonia   . S/P RF ablation operation for arrhythmia   . Shoulder tendinitis, left 2008  . Systolic murmur 12/2014   MILD AS, GRADE I DIASTOLIC DYSFUNCTION, ECHO  . Ulcer     Surgeries: Procedure(s): RIGHT TOTAL HIP ARTHROPLASTY ANTERIOR APPROACH on 11/23/2019   Consultants:   Discharged Condition: Improved  Hospital Course: Rebecca Farley is an 64 y.o. female who was admitted 11/23/2019 for operative treatment ofPrimary localized osteoarthritis of right hip. Patient has severe unremitting pain that affects sleep, daily activities, and work/hobbies. After pre-op clearance the patient was taken to the operating room on 11/23/2019 and underwent  Procedure(s): RIGHT TOTAL HIP ARTHROPLASTY ANTERIOR APPROACH.    Patient was given perioperative antibiotics:  Anti-infectives  (From admission, onward)   Start     Dose/Rate Route Frequency Ordered Stop   11/23/19 2130  vancomycin (VANCOCIN) IVPB 1000 mg/200 mL premix     1,000 mg 200 mL/hr over 60 Minutes Intravenous Every 12 hours 11/23/19 1516 11/23/19 2259   11/23/19 0800  vancomycin (VANCOCIN) IVPB 1000 mg/200 mL premix     1,000 mg 200 mL/hr over 60 Minutes Intravenous On call to O.R. 11/23/19 0751 11/23/19 1001       Patient was given sequential compression devices, early ambulation, and chemoprophylaxis to prevent DVT.  Patient benefited maximally from hospital stay and there were no complications.    Recent vital signs:  Patient Vitals for the past 24 hrs:  BP Temp Temp src Pulse Resp SpO2 Height Weight  11/24/19 0448 127/66 98.1 F (36.7 C) Oral 77 18 100 % -- --  11/24/19 0159 104/66 97.7 F (36.5 C) Oral 80 18 100 % -- --  11/23/19 2055 115/71 (!) 97.4 F (36.3 C) Oral 78 20 99 % -- --  11/23/19 1740 123/63 -- -- 70 16 100 % -- --  11/23/19 1615 (!) 150/84 97.6 F (36.4 C) Oral 80 16 99 % -- --  11/23/19 1512 (!) 141/92 (!) 97.5 F (36.4 C) Oral 91 16 100 % 5\' 5"  (1.651 m) 86.6 kg  11/23/19 1445 -- 97.7 F (36.5 C) -- -- -- -- -- --  11/23/19 1245 11/25/19)  152/89 -- -- 89 11 100 % -- --  11/23/19 1237 (!) 166/97 (!) 97.5 F (36.4 C) -- 90 19 100 % -- --     Recent laboratory studies: No results for input(s): WBC, HGB, HCT, PLT, NA, K, CL, CO2, BUN, CREATININE, GLUCOSE, INR, CALCIUM in the last 72 hours.  Invalid input(s): PT, 2   Discharge Medications:   Allergies as of 11/24/2019      Reactions   Penicillins Anaphylaxis, Hives, Other (See Comments)   Has patient had a PCN reaction causing immediate rash, facial/tongue/throat swelling, SOB or lightheadedness with hypotension: No Has patient had a PCN reaction causing severe rash involving mucus membranes or skin necrosis:  No Has patient had a PCN reaction that required hospitalization No Has patient had a PCN reaction occurring within  the last 10 years: No If all of the above answers are "NO", then may proceed with Cephalosporin use.   Codeine    FOR ALLERGY: STOMACH SICKNESS   Mobic [meloxicam]    DIFFICULT BREATHING   Nsaids Itching, Nausea And Vomiting      Medication List    TAKE these medications   aspirin 81 MG chewable tablet Chew 81 mg by mouth 2 (two) times daily.   b complex vitamins tablet Take 1 tablet by mouth daily.   baclofen 10 MG tablet Commonly known as: LIORESAL Take 10 mg by mouth 2 (two) times daily as needed (for migraines).   CALCIUM+D3 PO Take 1 tablet by mouth daily.   COSAMIN DS PO Take 1-2 tablets by mouth See admin instructions. Take 2 tablets by mouth in the morning & take 1 tablet by mouth at night.   Emergen-C Immune Plus Pack Take 1 packet by mouth daily.   EpiPen 2-Pak 0.3 mg/0.3 mL Soaj injection Generic drug: EPINEPHrine Inject 0.3 mg into the muscle as needed for anaphylaxis.   folic acid 800 MCG tablet Commonly known as: FOLVITE Take 800 mcg by mouth daily.   Ginkgo Biloba 120 MG Tabs Take 120 mg by mouth daily.   HYDROcodone-acetaminophen 5-325 MG tablet Commonly known as: NORCO/VICODIN Take 1-2 tablets by mouth every 6 (six) hours as needed for moderate pain.   imipramine 25 MG tablet Commonly known as: TOFRANIL Take 25 mg by mouth at bedtime.   Lubricant Eye Drops 0.4-0.3 % Soln Generic drug: Polyethyl Glycol-Propyl Glycol Place 1 drop into both eyes 3 (three) times daily as needed (dry/irritated eyes.).   multivitamin with minerals Tabs tablet Take 1 tablet by mouth daily.   Omega-3 1000 MG Caps Take 2,000 mg by mouth daily.   omeprazole 20 MG capsule Commonly known as: PRILOSEC Take 20 mg by mouth daily before breakfast.   promethazine 12.5 MG tablet Commonly known as: PHENERGAN Take 1-2 tablets (12.5-25 mg total) by mouth every 6 (six) hours as needed for nausea or vomiting.   tiZANidine 4 MG tablet Commonly known as: ZANAFLEX Take 4  mg by mouth every 6 (six) hours as needed (spasms).   Turmeric 500 MG Tabs Take 1,000 mg by mouth daily.   valsartan-hydrochlorothiazide 160-12.5 MG tablet Commonly known as: DIOVAN-HCT Take 1 tablet by mouth daily.   vitamin C with rose hips 500 MG tablet Take 500 mg by mouth daily.            Durable Medical Equipment  (From admission, onward)         Start     Ordered   11/23/19 1517  DME Walker rolling  Once  Question:  Patient needs a walker to treat with the following condition  Answer:  Primary osteoarthritis of right hip   11/23/19 1516   11/23/19 1517  DME 3 n 1  Once     11/23/19 1516   11/23/19 1517  DME Bedside commode  Once    Question:  Patient needs a bedside commode to treat with the following condition  Answer:  Primary osteoarthritis of right hip   11/23/19 1516          Diagnostic Studies: DG C-Arm 1-60 Min-No Report  Result Date: 11/23/2019 Fluoroscopy was utilized by the requesting physician.  No radiographic interpretation.   DG HIP OPERATIVE UNILAT W OR W/O PELVIS RIGHT  Result Date: 11/23/2019 CLINICAL DATA:  Right hip replacement EXAM: OPERATIVE RIGHT HIP (WITH PELVIS IF PERFORMED) 2 VIEWS TECHNIQUE: Fluoroscopic spot image(s) were submitted for interpretation post-operatively. COMPARISON:  None. FINDINGS: Multiple intraoperative fluoroscopic spot images are provided. Interval right total hip arthroplasty. FLUOROSCOPY TIME:  13.6 seconds IMPRESSION: Intraoperative localization. Electronically Signed   By: Elige KoHetal  Patel   On: 11/23/2019 12:21    Disposition: Discharge disposition: 01-Home or Self Care       Discharge Instructions    Call MD / Call 911   Complete by: As directed    If you experience chest pain or shortness of breath, CALL 911 and be transported to the hospital emergency room.  If you develope a fever above 101 F, pus (white drainage) or increased drainage or redness at the wound, or calf pain, call your surgeon's office.    Call MD / Call 911   Complete by: As directed    If you experience chest pain or shortness of breath, CALL 911 and be transported to the hospital emergency room.  If you develope a fever above 101 F, pus (white drainage) or increased drainage or redness at the wound, or calf pain, call your surgeon's office.   Constipation Prevention   Complete by: As directed    Drink plenty of fluids.  Prune juice may be helpful.  You may use a stool softener, such as Colace (over the counter) 100 mg twice a day.  Use MiraLax (over the counter) for constipation as needed.   Constipation Prevention   Complete by: As directed    Drink plenty of fluids.  Prune juice may be helpful.  You may use a stool softener, such as Colace (over the counter) 100 mg twice a day.  Use MiraLax (over the counter) for constipation as needed.   Diet - low sodium heart healthy   Complete by: As directed    Diet - low sodium heart healthy   Complete by: As directed    Discharge instructions   Complete by: As directed    INSTRUCTIONS AFTER JOINT REPLACEMENT   Remove items at home which could result in a fall. This includes throw rugs or furniture in walking pathways ICE to the affected joint every three hours while awake for 30 minutes at a time, for at least the first 3-5 days, and then as needed for pain and swelling.  Continue to use ice for pain and swelling. You may notice swelling that will progress down to the foot and ankle.  This is normal after surgery.  Elevate your leg when you are not up walking on it.   Continue to use the breathing machine you got in the hospital (incentive spirometer) which will help keep your temperature down.  It is common for your  temperature to cycle up and down following surgery, especially at night when you are not up moving around and exerting yourself.  The breathing machine keeps your lungs expanded and your temperature down.   DIET:  As you were doing prior to hospitalization, we  recommend a well-balanced diet.  DRESSING / WOUND CARE / SHOWERING  You may shower 3 days after surgery, but keep the wounds dry during showering.  You may use an occlusive plastic wrap (Press'n Seal for example), NO SOAKING/SUBMERGING IN THE BATHTUB.  If the bandage gets wet, change with a clean dry gauze.  If the incision gets wet, pat the wound dry with a clean towel.  ACTIVITY  Increase activity slowly as tolerated, but follow the weight bearing instructions below.   No driving for 6 weeks or until further direction given by your physician.  You cannot drive while taking narcotics.  No lifting or carrying greater than 10 lbs. until further directed by your surgeon. Avoid periods of inactivity such as sitting longer than an hour when not asleep. This helps prevent blood clots.  You may return to work once you are authorized by your doctor.     WEIGHT BEARING   Weight bearing as tolerated with assist device (walker, cane, etc) as directed, use it as long as suggested by your surgeon or therapist, typically at least 4-6 weeks.   EXERCISES  Results after joint replacement surgery are often greatly improved when you follow the exercise, range of motion and muscle strengthening exercises prescribed by your doctor. Safety measures are also important to protect the joint from further injury. Any time any of these exercises cause you to have increased pain or swelling, decrease what you are doing until you are comfortable again and then slowly increase them. If you have problems or questions, call your caregiver or physical therapist for advice.   Rehabilitation is important following a joint replacement. After just a few days of immobilization, the muscles of the leg can become weakened and shrink (atrophy).  These exercises are designed to build up the tone and strength of the thigh and leg muscles and to improve motion. Often times heat used for twenty to thirty minutes before working out will  loosen up your tissues and help with improving the range of motion but do not use heat for the first two weeks following surgery (sometimes heat can increase post-operative swelling).   These exercises can be done on a training (exercise) mat, on the floor, on a table or on a bed. Use whatever works the best and is most comfortable for you.    Use music or television while you are exercising so that the exercises are a pleasant break in your day. This will make your life better with the exercises acting as a break in your routine that you can look forward to.   Perform all exercises about fifteen times, three times per day or as directed.  You should exercise both the operative leg and the other leg as well.   Exercises include:   Quad Sets - Tighten up the muscle on the front of the thigh (Quad) and hold for 5-10 seconds.   Straight Leg Raises - With your knee straight (if you were given a brace, keep it on), lift the leg to 60 degrees, hold for 3 seconds, and slowly lower the leg.  Perform this exercise against resistance later as your leg gets stronger.  Leg Slides: Lying on your back, slowly slide your foot  toward your buttocks, bending your knee up off the floor (only go as far as is comfortable). Then slowly slide your foot back down until your leg is flat on the floor again.  Angel Wings: Lying on your back spread your legs to the side as far apart as you can without causing discomfort.  Hamstring Strength:  Lying on your back, push your heel against the floor with your leg straight by tightening up the muscles of your buttocks.  Repeat, but this time bend your knee to a comfortable angle, and push your heel against the floor.  You may put a pillow under the heel to make it more comfortable if necessary.   A rehabilitation program following joint replacement surgery can speed recovery and prevent re-injury in the future due to weakened muscles. Contact your doctor or a physical therapist for more  information on knee rehabilitation.    CONSTIPATION  Constipation is defined medically as fewer than three stools per week and severe constipation as less than one stool per week.  Even if you have a regular bowel pattern at home, your normal regimen is likely to be disrupted due to multiple reasons following surgery.  Combination of anesthesia, postoperative narcotics, change in appetite and fluid intake all can affect your bowels.   YOU MUST use at least one of the following options; they are listed in order of increasing strength to get the job done.  They are all available over the counter, and you may need to use some, POSSIBLY even all of these options:    Drink plenty of fluids (prune juice may be helpful) and high fiber foods Colace 100 mg by mouth twice a day  Senokot for constipation as directed and as needed Dulcolax (bisacodyl), take with full glass of water  Miralax (polyethylene glycol) once or twice a day as needed.  If you have tried all these things and are unable to have a bowel movement in the first 3-4 days after surgery call either your surgeon or your primary doctor.    If you experience loose stools or diarrhea, hold the medications until you stool forms back up.  If your symptoms do not get better within 1 week or if they get worse, check with your doctor.  If you experience "the worst abdominal pain ever" or develop nausea or vomiting, please contact the office immediately for further recommendations for treatment.   ITCHING:  If you experience itching with your medications, try taking only a single pain pill, or even half a pain pill at a time.  You can also use Benadryl over the counter for itching or also to help with sleep.   TED HOSE STOCKINGS:  Use stockings on both legs until for at least 2 weeks or as directed by physician office. They may be removed at night for sleeping.  MEDICATIONS:  See your medication summary on the "After Visit Summary" that nursing will  review with you.  You may have some home medications which will be placed on hold until you complete the course of blood thinner medication.  It is important for you to complete the blood thinner medication as prescribed.  PRECAUTIONS:  If you experience chest pain or shortness of breath - call 911 immediately for transfer to the hospital emergency department.   If you develop a fever greater that 101 F, purulent drainage from wound, increased redness or drainage from wound, foul odor from the wound/dressing, or calf pain - CONTACT YOUR SURGEON.  FOLLOW-UP APPOINTMENTS:  If you do not already have a post-op appointment, please call the office for an appointment to be seen by your surgeon.  Guidelines for how soon to be seen are listed in your "After Visit Summary", but are typically between 1-4 weeks after surgery.  OTHER INSTRUCTIONS:   Knee Replacement:  Do not place pillow under knee, focus on keeping the knee straight while resting. CPM instructions: 0-90 degrees, 2 hours in the morning, 2 hours in the afternoon, and 2 hours in the evening. Place foam block, curve side up under heel at all times except when in CPM or when walking.  DO NOT modify, tear, cut, or change the foam block in any way.  MAKE SURE YOU:  Understand these instructions.  Get help right away if you are not doing well or get worse.    Thank you for letting us be a part of your medical care team.  It is a privilege we respect greatly.  We hope these instructions will help you stay on track for a fast and full recovery!   Discharge instructions   Complete by: As directed    INSTRUCTIONS AFTER JOINT REPLACEMENT   Remove items at home which could result in a fall. This includes throw rugs or furniture in walking pathways ICE to the affected joint every three hours while awake for 30 minutes at a time, for at least the first 3-5 days, and then as needed for pain and swelling.   Continue to use ice for pain and swelling. You may notice swelling that will progress down to the foot and ankle.  This is normal after surgery.  Elevate your leg when you are not up walking on it.   Continue to use the breathing machine you got in the hospital (incentive spirometer) which will help keep your temperature down.  It is common for your temperature to cycle up and down following surgery, especially at night when you are not up moving around and exerting yourself.  The breathing machine keeps your lungs expanded and your temperature down.   DIET:  As you were doing prior to hospitalization, we recommend a well-balanced diet.  DRESSING / WOUND CARE / SHOWERING  You may shower 3 days after surgery, but keep the wounds dry during showering.  You may use an occlusive plastic wrap (Press'n Seal for example), NO SOAKING/SUBMERGING IN THE BATHTUB.  If the bandage gets wet, change with a clean dry gauze.  If the incision gets wet, pat the wound dry with a clean towel.  ACTIVITY  Increase activity slowly as tolerated, but follow the weight bearing instructions below.   No driving for 6 weeks or until further direction given by your physician.  You cannot drive while taking narcotics.  No lifting or carrying greater than 10 lbs. until further directed by your surgeon. Avoid periods of inactivity such as sitting longer than an hour when not asleep. This helps prevent blood clots.  You may return to work once you are authorized by your doctor.     WEIGHT BEARING   Weight bearing as tolerated with assist device (walker, cane, etc) as directed, use it as long as suggested by your surgeon or therapist, typically at least 4-6 weeks.   EXERCISES  Results after joint replacement surgery are often greatly improved when you follow the exercise, range of motion and muscle strengthening exercises prescribed by your doctor. Safety measures are also important to protect the joint from further injury.  Any  time any of these exercises cause you to have increased pain or swelling, decrease what you are doing until you are comfortable again and then slowly increase them. If you have problems or questions, call your caregiver or physical therapist for advice.   Rehabilitation is important following a joint replacement. After just a few days of immobilization, the muscles of the leg can become weakened and shrink (atrophy).  These exercises are designed to build up the tone and strength of the thigh and leg muscles and to improve motion. Often times heat used for twenty to thirty minutes before working out will loosen up your tissues and help with improving the range of motion but do not use heat for the first two weeks following surgery (sometimes heat can increase post-operative swelling).   These exercises can be done on a training (exercise) mat, on the floor, on a table or on a bed. Use whatever works the best and is most comfortable for you.    Use music or television while you are exercising so that the exercises are a pleasant break in your day. This will make your life better with the exercises acting as a break in your routine that you can look forward to.   Perform all exercises about fifteen times, three times per day or as directed.  You should exercise both the operative leg and the other leg as well.   Exercises include:   Quad Sets - Tighten up the muscle on the front of the thigh (Quad) and hold for 5-10 seconds.   Straight Leg Raises - With your knee straight (if you were given a brace, keep it on), lift the leg to 60 degrees, hold for 3 seconds, and slowly lower the leg.  Perform this exercise against resistance later as your leg gets stronger.  Leg Slides: Lying on your back, slowly slide your foot toward your buttocks, bending your knee up off the floor (only go as far as is comfortable). Then slowly slide your foot back down until your leg is flat on the floor again.  Angel Wings: Lying  on your back spread your legs to the side as far apart as you can without causing discomfort.  Hamstring Strength:  Lying on your back, push your heel against the floor with your leg straight by tightening up the muscles of your buttocks.  Repeat, but this time bend your knee to a comfortable angle, and push your heel against the floor.  You may put a pillow under the heel to make it more comfortable if necessary.   A rehabilitation program following joint replacement surgery can speed recovery and prevent re-injury in the future due to weakened muscles. Contact your doctor or a physical therapist for more information on knee rehabilitation.    CONSTIPATION  Constipation is defined medically as fewer than three stools per week and severe constipation as less than one stool per week.  Even if you have a regular bowel pattern at home, your normal regimen is likely to be disrupted due to multiple reasons following surgery.  Combination of anesthesia, postoperative narcotics, change in appetite and fluid intake all can affect your bowels.   YOU MUST use at least one of the following options; they are listed in order of increasing strength to get the job done.  They are all available over the counter, and you may need to use some, POSSIBLY even all of these options:    Drink plenty of fluids (prune juice may be helpful)  and high fiber foods Colace 100 mg by mouth twice a day  Senokot for constipation as directed and as needed Dulcolax (bisacodyl), take with full glass of water  Miralax (polyethylene glycol) once or twice a day as needed.  If you have tried all these things and are unable to have a bowel movement in the first 3-4 days after surgery call either your surgeon or your primary doctor.    If you experience loose stools or diarrhea, hold the medications until you stool forms back up.  If your symptoms do not get better within 1 week or if they get worse, check with your doctor.  If you  experience "the worst abdominal pain ever" or develop nausea or vomiting, please contact the office immediately for further recommendations for treatment.   ITCHING:  If you experience itching with your medications, try taking only a single pain pill, or even half a pain pill at a time.  You can also use Benadryl over the counter for itching or also to help with sleep.   TED HOSE STOCKINGS:  Use stockings on both legs until for at least 2 weeks or as directed by physician office. They may be removed at night for sleeping.  MEDICATIONS:  See your medication summary on the "After Visit Summary" that nursing will review with you.  You may have some home medications which will be placed on hold until you complete the course of blood thinner medication.  It is important for you to complete the blood thinner medication as prescribed.  PRECAUTIONS:  If you experience chest pain or shortness of breath - call 911 immediately for transfer to the hospital emergency department.   If you develop a fever greater that 101 F, purulent drainage from wound, increased redness or drainage from wound, foul odor from the wound/dressing, or calf pain - CONTACT YOUR SURGEON.                                                   FOLLOW-UP APPOINTMENTS:  If you do not already have a post-op appointment, please call the office for an appointment to be seen by your surgeon.  Guidelines for how soon to be seen are listed in your "After Visit Summary", but are typically between 1-4 weeks after surgery.  OTHER INSTRUCTIONS:   Knee Replacement:  Do not place pillow under knee, focus on keeping the knee straight while resting. CPM instructions: 0-90 degrees, 2 hours in the morning, 2 hours in the afternoon, and 2 hours in the evening. Place foam block, curve side up under heel at all times except when in CPM or when walking.  DO NOT modify, tear, cut, or change the foam block in any way.  MAKE SURE YOU:  Understand these  instructions.  Get help right away if you are not doing well or get worse.    Thank you for letting us be a part of your medical care team.  It is a privilege we respect greatly.  We hope these instructions will help you stay on track for a fast and full recovery!   Increase activity slowly as tolerated   Complete by: As directed    Increase activity slowly as tolerated   Complete by: As directed       Follow-up Information    Marcene Corningalldorf, Peter, MD. Schedule an appointment  as soon as possible for a visit in 2 weeks.   Specialty: Orthopedic Surgery Contact information: [address] [place] [address] [phone]            Signed: Ginger Organ Henleigh Robello 11/24/2019, 8:03 AM

## 2019-11-24 NOTE — Progress Notes (Signed)
Physical Therapy Treatment Patient Details Name: Rebecca Farley MRN: [id number] DOB: 01/05/1956 Today's Date: 11/24/2019    History of Present Illness 63 yo female s/p R THA 11/23/19.    PT Comments    Pt progressing well. Will see for a second session to review HEP.   Follow Up Recommendations  Follow surgeon's recommendation for DC plan and follow-up therapies     Equipment Recommendations  None recommended by PT    Recommendations for Other Services       Precautions / Restrictions Precautions Precautions: Fall Restrictions Weight Bearing Restrictions: No Other Position/Activity Restrictions: WBAT    Mobility  Bed Mobility Overal bed mobility: Needs Assistance Bed Mobility: Supine to Sit     Supine to sit: Min assist;HOB elevated     General bed mobility comments: instructed in use of gait belt as leg lifter. pt reports she may sleep on sofa/recliner at home  Transfers Overall transfer level: Needs assistance Equipment used: Rolling walker (2 wheeled) Transfers: Sit to/from Stand Sit to Stand: Min assist         General transfer comment: cues for hand placement   Ambulation/Gait Ambulation/Gait assistance: Min guard Gait Distance (Feet): 100 Feet Assistive device: Rolling walker (2 wheeled) Gait Pattern/deviations: Step-to pattern;Step-through pattern;Decreased stride length;Decreased stance time - right     General Gait Details: cues for sequence and gait progression   Stairs             Wheelchair Mobility    Modified Rankin (Stroke Patients Only)       Balance                                            Cognition Arousal/Alertness: Awake/alert Behavior During Therapy: WFL for tasks assessed/performed Overall Cognitive Status: Within Functional Limits for tasks assessed                                        Exercises      General Comments        Pertinent Vitals/Pain Pain Assessment:  0-10 Pain Score: 5  Pain Location: R hip/thigh Pain Descriptors / Indicators: Discomfort;Sore Pain Intervention(s): Limited activity within patient's tolerance;Monitored during session;Premedicated before session;Repositioned    Home Living                      Prior Function            PT Goals (current goals can now be found in the care plan section) Acute Rehab PT Goals Patient Stated Goal: regain PLOF PT Goal Formulation: With patient Time For Goal Achievement: 12/07/19 Potential to Achieve Goals: Good Progress towards PT goals: Progressing toward goals    Frequency    7X/week      PT Plan Current plan remains appropriate    Co-evaluation              AM-PAC PT "6 Clicks" Mobility   Outcome Measure  Help needed turning from your back to your side while in a flat bed without using bedrails?: A Little Help needed moving from lying on your back to sitting on the side of a flat bed without using bedrails?: A Little Help needed moving to and from a bed to a chair (including a wheelchair)?: A Little  Help needed standing up from a chair using your arms (e.g., wheelchair or bedside chair)?: A Little Help needed to walk in hospital room?: A Little Help needed climbing 3-5 steps with a railing? : A Little 6 Click Score: 18    End of Session Equipment Utilized During Treatment: Gait belt Activity Tolerance: Patient tolerated treatment well Patient left: in chair;with call bell/phone within reach;with family/visitor present;with chair alarm set Nurse Communication: Mobility status;Other (comment)(second session) PT Visit Diagnosis: Pain;Other abnormalities of gait and mobility (R26.89) Pain - Right/Left: Right Pain - part of body: Hip     Time: 1125-1140 PT Time Calculation (min) (ACUTE ONLY): 15 min  Charges:  $Gait Training: 8-22 mins                     Dezman Granda, PT   Acute Rehab Dept [facility]):  [phone]   11/24/2019    [facility] 11/24/2019, 11:44 AM

## 2019-11-24 NOTE — Progress Notes (Signed)
Subjective: 1 Day Post-Op Procedure(s) (LRB): RIGHT TOTAL HIP ARTHROPLASTY ANTERIOR APPROACH (Right) Patient feeling much better this mornign and is looking forward to going home.  Activity level:  wbat Diet tolerance:  ok Voiding:  ok Patient reports pain as mild.    Objective: Vital signs in last 24 hours: Temp:  [97.4 F (36.3 C)-98.1 F (36.7 C)] 98.1 F (36.7 C) (02/17 0448) Pulse Rate:  [70-91] 77 (02/17 0448) Resp:  [11-20] 18 (02/17 0448) BP: (104-166)/(63-97) 127/66 (02/17 0448) SpO2:  [99 %-100 %] 100 % (02/17 0448) Arterial Line BP: (147)/(72) 147/72 (02/16 1245) Weight:  [86.6 kg] 86.6 kg (02/16 1512)  Labs: No results for input(s): HGB in the last 72 hours. No results for input(s): WBC, RBC, HCT, PLT in the last 72 hours. No results for input(s): NA, K, CL, CO2, BUN, CREATININE, GLUCOSE, CALCIUM in the last 72 hours. No results for input(s): LABPT, INR in the last 72 hours.  Physical Exam:  Neurologically intact ABD soft Neurovascular intact Sensation intact distally Intact pulses distally Dorsiflexion/Plantar flexion intact Incision: dressing C/D/I and no drainage No cellulitis present Compartment soft  Assessment/Plan:  1 Day Post-Op Procedure(s) (LRB): RIGHT TOTAL HIP ARTHROPLASTY ANTERIOR APPROACH (Right) Advance diet Up with therapy Discharge home with home health today after PT this morning.  Follow up in office 2 weeks post op Continue on asa 81mg  BID x 4 weeks post op for DVT prevention. Patient had an episode of nausea after pain meds yesterday so I will send in phenergan to help with that at home.   Rebecca Farley 11/24/2019, 7:59 AM

## 2019-11-24 NOTE — Plan of Care (Signed)
All discharge instructions were given to patient. All questions were answered. 

## 2019-11-24 NOTE — Progress Notes (Signed)
   11/24/19 1439  PT Visit Information  Last PT Received On 11/24/19  Pt progressing well, able to sit EOB without assist. Reviewed HEP, pt able to return demo, self assisting with gait belt as needed throughout exercises. Ready for d/c from PT standpoint  Assistance Needed +1  History of Present Illness 64 yo female s/p R THA 11/23/19.  Subjective Data  Patient Stated Goal regain PLOF  Precautions  Precautions Fall  Restrictions  Weight Bearing Restrictions No  Other Position/Activity Restrictions WBAT  Pain Assessment  Pain Assessment 0-10  Pain Score 4  Pain Location R hip/thigh  Pain Descriptors / Indicators Discomfort;Sore  Pain Intervention(s) Limited activity within patient's tolerance;Monitored during session;Premedicated before session  Cognition  Arousal/Alertness Awake/alert  Behavior During Therapy WFL for tasks assessed/performed  Overall Cognitive Status Within Functional Limits for tasks assessed  Bed Mobility  Overal bed mobility Needs Assistance  Bed Mobility Supine to Sit  Supine to sit Min assist  General bed mobility comments pt able to self assist RLE off bed  Transfers  Overall transfer level Needs assistance  Equipment used Rolling walker (2 wheeled)  Transfers Sit to/from Stand  Sit to Stand Min guard  General transfer comment cues for hand placement   Ambulation/Gait  Ambulation/Gait assistance Supervision  Gait Distance (Feet) 15 Feet (x2)  Assistive device Rolling walker (2 wheeled)  Gait Pattern/deviations Step-to pattern;Step-through pattern;Decreased stride length;Decreased stance time - right  General Gait Details cues for sequence and gait progression  Total Joint Exercises  Ankle Circles/Pumps PROM;Both;10 reps  Quad Sets AROM;Both;10 reps  Short Arc Quad AROM;Right;10 reps  Heel Slides AAROM;AROM;Right;10 reps  Hip ABduction/ADduction AROM;Right;10 reps  Long Arc Quad AROM;Right;10 reps  PT - End of Session  Activity Tolerance Patient  tolerated treatment well  Patient left in bed;with call bell/phone within reach;with family/visitor present   PT - Assessment/Plan  PT Plan Current plan remains appropriate  PT Visit Diagnosis Pain;Other abnormalities of gait and mobility (R26.89)  Pain - Right/Left Right  Pain - part of body Hip  PT Frequency (ACUTE ONLY) 7X/week  Follow Up Recommendations Follow surgeon's recommendation for DC plan and follow-up therapies  PT equipment None recommended by PT  AM-PAC PT "6 Clicks" Mobility Outcome Measure (Version 2)  Help needed turning from your back to your side while in a flat bed without using bedrails? 4  Help needed moving from lying on your back to sitting on the side of a flat bed without using bedrails? 3  Help needed moving to and from a bed to a chair (including a wheelchair)? 3  Help needed standing up from a chair using your arms (e.g., wheelchair or bedside chair)? 3  Help needed to walk in hospital room? 3  Help needed climbing 3-5 steps with a railing?  3  6 Click Score 19  Consider Recommendation of Discharge To: Home with [facility]  PT Goal Progression  Progress towards PT goals Progressing toward goals  Acute Rehab PT Goals  PT Goal Formulation With patient  Time For Goal Achievement 12/07/19  Potential to Achieve Goals Good  PT Time Calculation  PT Start Time (ACUTE ONLY) 1421  PT Stop Time (ACUTE ONLY) 1435  PT Time Calculation (min) (ACUTE ONLY) 14 min  PT General Charges  $$ ACUTE PT VISIT 1 Visit  PT Treatments  $Therapeutic Exercise 8-22 mins

## 2019-11-25 ENCOUNTER — Encounter: Payer: Self-pay | Admitting: *Deleted

## 2019-12-01 MED FILL — VALSARTAN-HCTZ 160-12.5 MG: 160-12.5 | 30 days supply | Qty: 30 | Fill #9

## 2019-12-02 MED FILL — OMEPRAZOLE 20 MG CAP: 20 | 90 days supply | Qty: 90 | Fill #0

## 2019-12-30 ENCOUNTER — Other Ambulatory Visit: Payer: Self-pay

## 2019-12-30 ENCOUNTER — Ambulatory Visit
Admission: RE | Admit: 2019-12-30 | Discharge: 2019-12-30 | Disposition: A | Discharge status: Home / Self Care | Payer: Medicare Other | Source: Ambulatory Visit | Attending: Internal Medicine | Admitting: Internal Medicine

## 2019-12-30 DIAGNOSIS — M8580 Other specified disorders of bone density and structure, unspecified site: Secondary | ICD-10-CM

## 2019-12-30 DIAGNOSIS — Z1231 Encounter for screening mammogram for malignant neoplasm of breast: Secondary | ICD-10-CM

## 2020-01-10 MED FILL — IMIPRAMINE HCL 25 MG TABLET: 25 | 90 days supply | Qty: 90 | Fill #1

## 2020-01-20 ENCOUNTER — Ambulatory Visit: Payer: Medicare Other | Admitting: Interventional Cardiology

## 2020-01-28 ENCOUNTER — Encounter: Payer: Self-pay | Admitting: Interventional Cardiology

## 2020-01-28 ENCOUNTER — Other Ambulatory Visit: Payer: Self-pay

## 2020-01-28 ENCOUNTER — Ambulatory Visit (INDEPENDENT_AMBULATORY_CARE_PROVIDER_SITE_OTHER): Payer: Medicare Other | Admitting: Interventional Cardiology

## 2020-01-28 VITALS — BP 116/86 | HR 95 | Ht 65.0 in | Wt 192.4 lb

## 2020-01-28 DIAGNOSIS — I10 Essential (primary) hypertension: Secondary | ICD-10-CM | POA: Diagnosis not present

## 2020-01-28 DIAGNOSIS — I4892 Unspecified atrial flutter: Secondary | ICD-10-CM

## 2020-01-28 DIAGNOSIS — I350 Nonrheumatic aortic (valve) stenosis: Secondary | ICD-10-CM

## 2020-01-28 NOTE — Progress Notes (Signed)
Cardiology Office Note   Date:  01/28/2020   ID:  Rebecca Farley, DOB 10-21-1955, MRN [id number]  PCP:  Kirby Funk, MD    No chief complaint on file.  Aortic stenosis  Wt Readings from Last 3 Encounters:  01/28/20 192 lb 6.4 oz (87.3 kg)  11/23/19 190 lb 14.7 oz (86.6 kg)  11/19/19 189 lb 11.2 oz (86 kg)       History of Present Illness: Rebecca Farley is a 64 y.o. female  With a h/o atrial flutter and AS.  Works at [employer].  She had a h/o atrial flutter in the past and had an ablation in [place] many years ago.  She had a heart murmur on her routine exam and was sent for an echo in 2020.  She was not having any new symptoms.   She does report some DOE for 15 years.  She has had soe dizzy spells for 15 years as well.  It can happen even when she was sitting down.  She wore a monitor many years ago and this was ewhen the AFlutter was diagnosed.  HR was 300 bpm per her report.  04/2019 echo: "The left ventricle has hyperdynamic systolic function, with an  ejection fraction of >65%. The cavity size was normal. There is mildly  increased left ventricular wall thickness. Left ventricular diastolic  Doppler parameters are consistent with  impaired relaxation. Elevated mean left atrial pressure.  2. The right ventricle has normal systolic function. The cavity was  normal. There is no increase in right ventricular wall thickness. Right  ventricular systolic pressure is normal with an estimated pressure of 22.1  mmHg.  3. Mild thickening of the mitral valve leaflet.  4. The aortic valve is tricuspid. Moderate thickening of the aortic  valve. Moderate calcification of the aortic valve. Aortic valve  regurgitation is trivial by color flow Doppler. Severe stenosis of the  aortic valve.  5. The aortic root and ascending aorta are normal in size and structure. "  Since the last visit, she has had hip surgery- Dr. Jerl Santos.  She just finished PT.  She had some DOE  which got better over the course of PT.  It is not gone completely at this time.   She has had some lightheadedness.  Not related to exertion.  It only occurs when she turns her head a certain way while at her desk.  No problems when she was riding the exercise bike.    Two siblings who have valve disease, brother had his Ao valve and mitral valve replaced in 09/2019.  Sister needs AVR as well.   She   Past Medical History:  Diagnosis Date  . Atrial flutter (HCC) 01/2008   RADIOFREQUENCY ABLATION [place]  . Barrett esophagus   . Bee sting allergy   . Colitis   . Diverticulitis   . DJD (degenerative joint disease) of knee    KNEES, BACK (LUMBAR FACET DISEASE) AND HIPS, DALLDORF.  Marland Kitchen Dyspnea    AT REST,ON EXERTION  . Dysrhythmia    AFIB  . Family history of carrier of genetic disease   . Family history of colon cancer   . Family history of liver cancer   . Family history of lung cancer   . Family history of melanoma   . Family history of ovarian cancer   . Family history of prostate cancer   . GERD (gastroesophageal reflux disease)   . Heart murmur   .  Hiatal hernia   . History of syncope    RECURRENT  . Hyperlipidemia    BORDERLINE  . Hypertension   . Migraines   . Obesity   . Osteopenia   . Pneumonia   . S/P RF ablation operation for arrhythmia   . Shoulder tendinitis, left 2008  . Systolic murmur 12/2014   MILD AS, GRADE I DIASTOLIC DYSFUNCTION, ECHO  . Ulcer     Past Surgical History:  Procedure Laterality Date  . ABDOMINAL HYSTERECTOMY    . BREAST BIOPSY Left    10 yrs +  . CESAREAN SECTION     1979, 1984  . CHOLECYSTECTOMY    . COLONOSCOPY  11/03/2012   Procedure: COLONOSCOPY;  Surgeon: Charolett Bumpers, MD;  Location: [facility] ENDOSCOPY;  Service: Endoscopy;  Laterality: N/A;  . ESOPHAGOGASTRODUODENOSCOPY  11/03/2012   Procedure: ESOPHAGOGASTRODUODENOSCOPY (EGD);  Surgeon: Charolett Bumpers, MD;  Location: [facility];  Service: Endoscopy;  Laterality:  N/A;  . implanted loop device    . TOTAL HIP ARTHROPLASTY Right 11/23/2019   Procedure: RIGHT TOTAL HIP ARTHROPLASTY ANTERIOR APPROACH;  Surgeon: Marcene Corning, MD;  Location: [facility] ORS;  Service: Orthopedics;  Laterality: Right;     Current Outpatient Medications  Medication Sig Dispense Refill  . Ascorbic Acid (VITAMIN C WITH ROSE HIPS) 500 MG tablet Take 500 mg by mouth daily.    Marland Kitchen b complex vitamins tablet Take 1 tablet by mouth daily.    . baclofen (LIORESAL) 10 MG tablet Take 10 mg by mouth 2 (two) times daily as needed (for migraines).    . Calcium Carb-Cholecalciferol (CALCIUM+D3 PO) Take 1 tablet by mouth daily.    Marland Kitchen EPINEPHrine (EPIPEN 2-PAK) 0.3 mg/0.3 mL IJ SOAJ injection Inject 0.3 mg into the muscle as needed for anaphylaxis.    . folic acid (FOLVITE) 800 MCG tablet Take 800 mcg by mouth daily.    . Ginkgo Biloba 120 MG TABS Take 120 mg by mouth daily.    . Glucosamine-Chondroitin (COSAMIN DS PO) Take 1-2 tablets by mouth See admin instructions. Take 2 tablets by mouth in the morning & take 1 tablet by mouth at night.    Marland Kitchen imipramine (TOFRANIL) 25 MG tablet Take 25 mg by mouth at bedtime.     . Multiple Vitamin (MULTIVITAMIN WITH MINERALS) TABS tablet Take 1 tablet by mouth daily.    . Multiple Vitamins-Minerals (EMERGEN-C IMMUNE PLUS) PACK Take 1 packet by mouth daily.    . Omega-3 1000 MG CAPS Take 2,000 mg by mouth daily.    Marland Kitchen omeprazole (PRILOSEC) 20 MG capsule Take 20 mg by mouth daily before breakfast.     . Polyethyl Glycol-Propyl Glycol (LUBRICANT EYE DROPS) 0.4-0.3 % SOLN Place 1 drop into both eyes 3 (three) times daily as needed (dry/irritated eyes.).    Marland Kitchen Turmeric 500 MG TABS Take 1,000 mg by mouth daily.    . valsartan-hydrochlorothiazide (DIOVAN-HCT) 160-12.5 MG tablet Take 1 tablet by mouth daily.      No current facility-administered medications for this visit.    Allergies:   Penicillins, Codeine, Mobic [meloxicam], and Nsaids    Social History:  The  patient  reports that she has never smoked. She has never used smokeless tobacco. She reports current alcohol use. She reports that she does not use drugs.   Family History:  The patient's family history includes Aneurysm in her mother; Colon cancer (age of onset: 6) in her sister; Colon cancer (age of onset: 40) in her brother; Emphysema  in her father; Heart failure in her mother; Hypertension in her mother; Idiopathic pulmonary fibrosis in her brother; Kidney failure in her mother; Liver cancer in her maternal uncle; Lung cancer in her father, maternal uncle, and paternal uncle; Melanoma in her sister; Melanoma (age of onset: 77) in her sister; Other in her sister, sister, sister, and sister; Ovarian cancer in her paternal grandmother; Prostate cancer in her father.    ROS:  Please see the history of present illness.   Otherwise, review of systems are positive for increasing stamina.   All other systems are reviewed and negative.    PHYSICAL EXAM: VS:  BP 116/86   Pulse 95   Ht 5\' 5"  (1.651 m)   Wt 192 lb 6.4 oz (87.3 kg)   SpO2 96%   BMI 32.02 kg/m  , BMI Body mass index is 32.02 kg/m. GEN: Well nourished, well developed, in no acute distress  HEENT: normal  Neck: no JVD, carotid bruits, or masses Cardiac: RRR; 3/6 systolic murmur, no rubs, or gallops,no edema  Respiratory:  clear to auscultation bilaterally, normal work of breathing GI: soft, nontender, nondistended, + BS MS: no deformity or atrophy  Skin: warm and dry, no rash Neuro:  Strength and sensation are intact Psych: euthymic mood, full affect   EKG:   The ekg ordered 2/21 demonstrates NSR, no ST changes   Recent Labs: 11/19/2019: Hemoglobin 13.2; Platelets 255   Lipid Panel No results found for: CHOL, TRIG, HDL, CHOLHDL, VLDL, LDLCALC, LDLDIRECT   Other studies Reviewed: Additional studies/ records that were reviewed today with results demonstrating: LDL 145 in 03/2019.   ASSESSMENT AND PLAN:  1. Aortic  stenosis: Check echo in July 2021.  No sx despite severe AS.  Await sx before planning for AVR. If sx like syncope, dizziness, fluid retention occur, let us know.   2. Atrial flutter: s/p ablation.  No palpitations.  3. HTN: The current medical regimen is effective;  continue present plan and medications. 4. Whole food plant based diet recommended.    Current medicines are reviewed at length with the patient today.  The patient concerns regarding her medicines were addressed.  The following changes have been made:  No change  Labs/ tests ordered today include:  No orders of the defined types were placed in this encounter.   Recommend 150 minutes/week of aerobic exercise Low fat, low carb, high fiber diet recommended  Disposition:   FU in 6 months, echo in 04/2019   Signed, Larae Grooms, MD  01/28/2020 4:54 PM    [facility] [address] [place], [place], [place]  [address] Phone: [phone]; Fax: [phone]

## 2020-01-28 NOTE — Patient Instructions (Signed)
Medication Instructions:  Your physician recommends that you continue on your current medications as directed. Please refer to the Current Medication list given to you today.  *If you need a refill on your cardiac medications before your next appointment, please call your pharmacy*   Lab Work: None ordered  If you have labs (blood work) drawn today and your tests are completely normal, you will receive your results only by: Marland Kitchen MyChart Message (if you have MyChart) OR . A paper copy in the mail If you have any lab test that is abnormal or we need to change your treatment, we will call you to review the results.   Testing/Procedures: Your physician has requested that you have an echocardiogram in mid July early or early August. Echocardiography is a painless test that uses sound waves to create images of your heart. It provides your doctor with information about the size and shape of your heart and how well your heart's chambers and valves are working. This procedure takes approximately one hour. There are no restrictions for this procedure.  Follow-Up: At [facility], you and your health needs are our priority.  As part of our continuing mission to provide you with exceptional heart care, we have created designated Provider Care Teams.  These Care Teams include your primary Cardiologist (physician) and Advanced Practice Providers (APPs -  Physician Assistants and Nurse Practitioners) who all work together to provide you with the care you need, when you need it.  We recommend signing up for the patient portal called "MyChart".  Sign up information is provided on this After Visit Summary.  MyChart is used to connect with patients for Virtual Visits (Telemedicine).  Patients are able to view lab/test results, encounter notes, upcoming appointments, etc.  Non-urgent messages can be sent to your provider as well.   To learn more about what you can do with MyChart, go to [email or web address].     Your next appointment:   6 month(s)  The format for your next appointment:   In Person  Provider:   You may see Lance Muss, MD or one of the following Advanced Practice Providers on your designated Care Team:    Ronie Spies, PA-C  Jacolyn Reedy, PA-C    Other Instructions

## 2020-02-24 MED FILL — OMEPRAZOLE 20 MG CAP: 20 | 90 days supply | Qty: 90 | Fill #1

## 2020-04-10 MED FILL — IMIPRAMINE HCL 25 MG TABLET: 25 | 90 days supply | Qty: 90 | Fill #2

## 2020-04-17 ENCOUNTER — Other Ambulatory Visit (HOSPITAL_COMMUNITY): Payer: Medicare Other

## 2020-05-08 ENCOUNTER — Other Ambulatory Visit (HOSPITAL_COMMUNITY): Payer: Medicare Other

## 2020-05-24 MED FILL — OMEPRAZOLE 20 MG CAP: 20 | 90 days supply | Qty: 90 | Fill #2

## 2020-05-25 ENCOUNTER — Other Ambulatory Visit (HOSPITAL_COMMUNITY): Payer: Medicare Other

## 2020-06-02 MED FILL — OMEPRAZOLE 20 MG CAP: 20 | 90 days supply | Qty: 90 | Fill #2

## 2020-06-08 ENCOUNTER — Ambulatory Visit (HOSPITAL_COMMUNITY): Payer: Medicare Other | Attending: Cardiology

## 2020-06-08 ENCOUNTER — Other Ambulatory Visit: Payer: Self-pay

## 2020-06-08 DIAGNOSIS — I350 Nonrheumatic aortic (valve) stenosis: Secondary | ICD-10-CM

## 2020-06-08 LAB — ECHOCARDIOGRAM COMPLETE
AR max vel: 0.75 cm2
AV Area VTI: 0.8 cm2
AV Area mean vel: 0.73 cm2
AV Mean grad: 62 mmHg
AV Peak grad: 105.3 mmHg
Ao pk vel: 5.13 m/s
Area-P 1/2: 3.21 cm2
S' Lateral: 2.1 cm

## 2020-06-14 ENCOUNTER — Ambulatory Visit: Payer: Medicare Other | Admitting: Neurology

## 2020-06-15 ENCOUNTER — Ambulatory Visit (INDEPENDENT_AMBULATORY_CARE_PROVIDER_SITE_OTHER): Payer: Medicare Other | Admitting: Neurology

## 2020-06-15 ENCOUNTER — Encounter: Payer: Self-pay | Admitting: Neurology

## 2020-06-15 VITALS — BP 107/70 | HR 75 | Ht 65.0 in | Wt 190.0 lb

## 2020-06-15 DIAGNOSIS — R5383 Other fatigue: Secondary | ICD-10-CM

## 2020-06-15 DIAGNOSIS — R519 Headache, unspecified: Secondary | ICD-10-CM | POA: Diagnosis not present

## 2020-06-15 DIAGNOSIS — R51 Headache with orthostatic component, not elsewhere classified: Secondary | ICD-10-CM | POA: Diagnosis not present

## 2020-06-15 DIAGNOSIS — H539 Unspecified visual disturbance: Secondary | ICD-10-CM

## 2020-06-15 DIAGNOSIS — G43711 Chronic migraine without aura, intractable, with status migrainosus: Secondary | ICD-10-CM | POA: Diagnosis not present

## 2020-06-15 MED ORDER — NURTEC 75 MG PO TBDP: 75.0000 mg | ORAL_TABLET | Freq: Every day | ORAL | 6 refills | Status: DC | PRN

## 2020-06-15 MED ORDER — EMGALITY 120 MG/ML ~~LOC~~ SOAJ: 120.0000 mg | SUBCUTANEOUS | 11 refills | Status: DC

## 2020-06-15 NOTE — Patient Instructions (Addendum)
MRI of the brain w/wo contrast Blood work Sleep evaluation Start Emgality as preventative Nurtec once daily as needed for headache or migraine  Galcanezumab injection What is this medicine? GALCANEZUMAB (gal ka NEZ ue mab) is used to prevent migraines and treat cluster headaches. This medicine may be used for other purposes; ask your health care provider or pharmacist if you have questions. COMMON BRAND NAME(S): Emgality What should I tell my health care provider before I take this medicine? They need to know if you have any of these conditions:  an unusual or allergic reaction to galcanezumab, other medicines, foods, dyes, or preservatives  pregnant or trying to get pregnant  breast-feeding How should I use this medicine? This medicine is for injection under the skin. You will be taught how to prepare and give this medicine. Use exactly as directed. Take your medicine at regular intervals. Do not take your medicine more often than directed. It is important that you put your used needles and syringes in a special sharps container. Do not put them in a trash can. If you do not have a sharps container, call your pharmacist or healthcare provider to get one. Talk to your pediatrician regarding the use of this medicine in children. Special care may be needed. Overdosage: If you think you have taken too much of this medicine contact a poison control center or emergency room at once. NOTE: This medicine is only for you. Do not share this medicine with others. What if I miss a dose? If you miss a dose, take it as soon as you can. If it is almost time for your next dose, take only that dose. Do not take double or extra doses. What may interact with this medicine? Interactions are not expected. This list may not describe all possible interactions. Give your health care provider a list of all the medicines, herbs, non-prescription drugs, or dietary supplements you use. Also tell them if you smoke,  drink alcohol, or use illegal drugs. Some items may interact with your medicine. What should I watch for while using this medicine? Tell your doctor or healthcare professional if your symptoms do not start to get better or if they get worse. What side effects may I notice from receiving this medicine? Side effects that you should report to your doctor or health care professional as soon as possible:  allergic reactions like skin rash, itching or hives, swelling of the face, lips, or tongue Side effects that usually do not require medical attention (report these to your doctor or health care professional if they continue or are bothersome):  pain, redness, or irritation at site where injected This list may not describe all possible side effects. Call your doctor for medical advice about side effects. You may report side effects to FDA at 1-800-FDA-1088. Where should I keep my medicine? Keep out of the reach of children. You will be instructed on how to store this medicine. Throw away any unused medicine after the expiration date on the label. NOTE: This sheet is a summary. It may not cover all possible information. If you have questions about this medicine, talk to your doctor, pharmacist, or health care provider.  2020 Elsevier/Gold Standard (2018-03-11 12:03:23) Rimegepant oral dissolving tablet What is this medicine? RIMEGEPANT (ri ME je pant) is used to treat migraine headaches with or without aura. An aura is a strange feeling or visual disturbance that warns you of an attack. It is not used to prevent migraines. This medicine may be used  for other purposes; ask your health care provider or pharmacist if you have questions. COMMON BRAND NAME(S): NURTEC ODT What should I tell my health care provider before I take this medicine? They need to know if you have any of these conditions:  kidney disease  liver disease  an unusual or allergic reaction to rimegepant, other medicines, foods,  dyes, or preservatives  pregnant or trying to get pregnant  breast-feeding How should I use this medicine? Take the medicine by mouth. Follow the directions on the prescription label. Leave the tablet in the sealed blister pack until you are ready to take it. With dry hands, open the blister and gently remove the tablet. If the tablet breaks or crumbles, throw it away and take a new tablet out of the blister pack. Place the tablet in the mouth and allow it to dissolve, and then swallow. Do not cut, crush, or chew this medicine. You do not need water to take this medicine. Talk to your pediatrician about the use of this medicine in children. Special care may be needed. Overdosage: If you think you have taken too much of this medicine contact a poison control center or emergency room at once. NOTE: This medicine is only for you. Do not share this medicine with others. What if I miss a dose? This does not apply. This medicine is not for regular use. What may interact with this medicine? This medicine may interact with the following medications:  certain medicines for fungal infections like fluconazole, itraconazole  rifampin This list may not describe all possible interactions. Give your health care provider a list of all the medicines, herbs, non-prescription drugs, or dietary supplements you use. Also tell them if you smoke, drink alcohol, or use illegal drugs. Some items may interact with your medicine. What should I watch for while using this medicine? Visit your health care professional for regular checks on your progress. Tell your health care professional if your symptoms do not start to get better or if they get worse. What side effects may I notice from receiving this medicine? Side effects that you should report to your doctor or health care professional as soon as possible:  allergic reactions like skin rash, itching or hives; swelling of the face, lips, or tongue Side effects that  usually do not require medical attention (report these to your doctor or health care professional if they continue or are bothersome):  nausea This list may not describe all possible side effects. Call your doctor for medical advice about side effects. You may report side effects to FDA at 1-800-FDA-1088. Where should I keep my medicine? Keep out of the reach of children. Store at room temperature between 15 and 30 degrees C (59 and 86 degrees F). Throw away any unused medicine after the expiration date. NOTE: This sheet is a summary. It may not cover all possible information. If you have questions about this medicine, talk to your doctor, pharmacist, or health care provider.  2020 Elsevier/Gold Standard (2018-12-07 00:21:31)

## 2020-06-15 NOTE — Progress Notes (Signed)
[facility]    Provider:  Dr Jaynee Eagles Requesting Provider: Lavone Orn, MD Primary Care Provider:  Lavone Orn, MD  CC:  Worsening migraines  HPI:  Rebecca Farley is a 64 y.o. female here as requested by Lavone Orn, MD for migraines. I reviewed Dr. Delene Ruffini notes: She has a past medical history of degenerative joint disease of the hip, osteopenia, severe aortic stenosis, migraine headache, degenerative joint disease of the knee, a flutter, hypertension, GERD, obesity and other chronic pain.  It appears at last appointment she was treated with imipramine 25 mg and baclofen 10 mg possibly for management of migraine headaches, also on valsartan hydrochlorothiazide for blood pressure however this can be used for migraine management, she follows with cardiology for severe aortic stenosis, imipramine for prevention with headaches but requested neurology again (not sure where she had been to prior.  She reported to Dr. Laurann Montana that she had more headaches recently, she moved into a new home in the setting of stress, sometimes migraines lasting 2 to 3 days, taking baclofen for acute headaches and imipramine preventively, tries to stay healthy, she is exercising regularly, not been a good sleeper for years, she saw a headache wellness center once in the past but did not go back.  I also reviewed Dr. Delene Ruffini labs which were collected November 04, 2019 which showed a normal BMP with BUN 12 and creatinine 0.76, unremarkable CBC with differential,   She is here with worsening migraines. She snores and wakes herself up. She has morning headaches, recent fatigue. Diagnosed with migraines 36 years ago. After she had her daughter. Migraines unilateral used to be always behind the right eye, pulsating, pounding, throbbing, severe, now it is behind the left eye sometimes, then it may radiate, the headaches in the morning are less migrainous more holocephalic. +light/sound sensitivity, nausea,  movement makes it worse, the headache is positional and exertional, daily, worsening, continuous all day long, can be severe to the point she can vomit, ongoing and worsening the last 6 months, they can last 3 days. No other focal neurologic deficits, associated symptoms, inciting events or modifiable factors.  Reviewed notes, labs and imaging from outside physicians, which showed: see above  Review of Systems: Patient complains of symptoms per HPI as well as the following symptoms: headache. Pertinent negatives and positives per HPI. All others negative.   Social History   Socioeconomic History  . Marital status: Widowed    Spouse name: Not on file  . Number of children: Not on file  . Years of education: Not on file  . Highest education level: Not on file  Occupational History  . Not on file  Tobacco Use  . Smoking status: Never Smoker  . Smokeless tobacco: Never Used  Vaping Use  . Vaping Use: Never used  Substance and Sexual Activity  . Alcohol use: Yes    Comment: OCCASIONALY  . Drug use: No  . Sexual activity: Not on file  Other Topics Concern  . Not on file  Social History Narrative  . Not on file   Social Determinants of Health   Financial Resource Strain:   . Difficulty of Paying Living Expenses: Not on file  Food Insecurity:   . Worried About Charity fundraiser in the Last Year: Not on file  . Ran Out of Food in the Last Year: Not on file  Transportation Needs:   . Lack of Transportation (Medical): Not on file  . Lack of Transportation (Non-Medical): Not  on file  Physical Activity:   . Days of Exercise per Week: Not on file  . Minutes of Exercise per Session: Not on file  Stress:   . Feeling of Stress : Not on file  Social Connections:   . Frequency of Communication with Friends and Family: Not on file  . Frequency of Social Gatherings with Friends and Family: Not on file  . Attends Religious Services: Not on file  . Active Member of Clubs or  Organizations: Not on file  . Attends Archivist Meetings: Not on file  . Marital Status: Not on file  Intimate Partner Violence:   . Fear of Current or Ex-Partner: Not on file  . Emotionally Abused: Not on file  . Physically Abused: Not on file  . Sexually Abused: Not on file    Family History  Problem Relation Age of Onset  . Hypertension Mother   . Heart failure Mother   . Aneurysm Mother   . Kidney failure Mother   . Emphysema Father   . Prostate cancer Father   . Lung cancer Father   . Colon cancer Sister 22       dx 3 times; Lynch syndrome (PMS2)  . Melanoma Sister 63  . Other Sister        genetic testing - PMS2+, BRIP1+, MITF+  . Melanoma Sister   . Other Sister        genetic testing - PMS2+  . Idiopathic pulmonary fibrosis Brother   . Lung cancer Maternal Uncle   . Liver cancer Maternal Uncle   . Lung cancer Paternal Uncle   . Ovarian cancer Paternal Grandmother        died early 83s  . Other Sister        genetic testing - BRIP1+, PMS2-, MITF-  . Other Sister        genetic testing - BRIP1+, MITF+, PMS2-  . Colon cancer Brother 48    Past Medical History:  Diagnosis Date  . Atrial flutter ([place]) 01/2008   RADIOFREQUENCY ABLATION [place]  . Barrett esophagus   . Bee sting allergy   . Colitis   . Diverticulitis   . DJD (degenerative joint disease) of knee    KNEES, BACK (LUMBAR FACET DISEASE) AND HIPS, DALLDORF.  Marland Kitchen Dyspnea    AT REST,ON EXERTION  . Dysrhythmia    AFIB  . Family history of carrier of genetic disease   . Family history of colon cancer   . Family history of liver cancer   . Family history of lung cancer   . Family history of melanoma   . Family history of ovarian cancer   . Family history of prostate cancer   . GERD (gastroesophageal reflux disease)   . Heart murmur   . Hiatal hernia   . History of syncope    RECURRENT  . Hyperlipidemia    BORDERLINE  . Hypertension   . Migraines   . Obesity   .  Osteopenia   . Pneumonia   . S/P RF ablation operation for arrhythmia   . Shoulder tendinitis, left 2008  . Systolic murmur 81/8563   MILD AS, GRADE I DIASTOLIC DYSFUNCTION, ECHO  . Ulcer     Patient Active Problem List   Diagnosis Date Noted  . Chronic migraine without aura, with intractable migraine, so stated, with status migrainosus 06/18/2020  . Primary localized osteoarthritis of right hip 11/23/2019  . Primary osteoarthritis of right hip 11/23/2019  .  Genetic testing 06/01/2019  . 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 Family history of colon cancer   . Family history of melanoma   . Family history of ovarian cancer   . Family history of lung cancer   . Family history of prostate cancer   . Family history of liver cancer   . Family history of carrier of genetic disease     Past Surgical History:  Procedure Laterality Date  . ABDOMINAL HYSTERECTOMY    . BREAST BIOPSY Left    10 yrs +  . CESAREAN SECTION     1979, 1984  . CHOLECYSTECTOMY    . COLONOSCOPY  11/03/2012   Procedure: COLONOSCOPY;  Surgeon: Garlan Fair, MD;  Location: [facility];  Service: Endoscopy;  Laterality: N/A;  . ESOPHAGOGASTRODUODENOSCOPY  11/03/2012   Procedure: ESOPHAGOGASTRODUODENOSCOPY (EGD);  Surgeon: Garlan Fair, MD;  Location: [facility];  Service: Endoscopy;  Laterality: N/A;  . implanted loop device    . TOTAL HIP ARTHROPLASTY Right 11/23/2019   Procedure: RIGHT TOTAL HIP ARTHROPLASTY ANTERIOR APPROACH;  Surgeon: Melrose Nakayama, MD;  Location: [facility];  Service: Orthopedics;  Laterality: Right;    Current Outpatient Medications  Medication Sig Dispense Refill  . Ascorbic Acid (VITAMIN C WITH ROSE HIPS) 500 MG tablet Take 500 mg by mouth daily.    Marland Kitchen b complex vitamins tablet Take 1 tablet by mouth daily.    . baclofen (LIORESAL) 10 MG tablet Take 10 mg by mouth 2 (two) times daily as needed (for migraines).    . Calcium Carb-Cholecalciferol (CALCIUM+D3 PO) Take 1  tablet by mouth daily.    Marland Kitchen EPINEPHrine (EPIPEN 2-PAK) 0.3 mg/0.3 mL IJ SOAJ injection Inject 0.3 mg into the muscle as needed for anaphylaxis.    . folic acid (FOLVITE) 1 MG tablet Take 1 mg by mouth daily.    . Ginkgo Biloba 120 MG TABS Take 120 mg by mouth daily.    . Glucosamine-Chondroitin (COSAMIN DS PO) Take 1-2 tablets by mouth See admin instructions. Take 2 tablets by mouth in the morning & take 1 tablet by mouth at night.    Marland Kitchen imipramine (TOFRANIL) 25 MG tablet Take 25 mg by mouth at bedtime.     . Multiple Vitamin (MULTIVITAMIN WITH MINERALS) TABS tablet Take 1 tablet by mouth daily.    . Multiple Vitamins-Minerals (EMERGEN-C IMMUNE PLUS) PACK Take 1 packet by mouth daily.    . Omega-3 1000 MG CAPS Take 2,000 mg by mouth daily.    Marland Kitchen omeprazole (PRILOSEC) 20 MG capsule Take 20 mg by mouth daily before breakfast.     . Polyethyl Glycol-Propyl Glycol (LUBRICANT EYE DROPS) 0.4-0.3 % SOLN Place 1 drop into both eyes 3 (three) times daily as needed (dry/irritated eyes.).    Marland Kitchen Turmeric 500 MG TABS Take 1,000 mg by mouth daily.    . valsartan-hydrochlorothiazide (DIOVAN-HCT) 160-12.5 MG tablet Take 1 tablet by mouth daily.     . Galcanezumab-gnlm (EMGALITY) 120 MG/ML SOAJ Inject 120 mg into the skin every 30 (thirty) days. 1.12 mL 11  . Rimegepant Sulfate (NURTEC) 75 MG TBDP Take 75 mg by mouth daily as needed. For migraines. Take as close to onset of migraine as possible. One daily maximum. 8 tablet 6   No current facility-administered medications for this visit.    Allergies as of 06/15/2020 - Review Complete 06/15/2020  Allergen Reaction Noted  . Penicillins Anaphylaxis, Hives, and Other (See Comments) 09/05/2011  . Codeine  06/16/2019  .  Mobic [meloxicam]  06/16/2019  . Nsaids Itching and Nausea And Vomiting 09/05/2011    Vitals: BP 107/70   Pulse 75   Ht _0  (1.651 m)   Wt 190 lb (86.2 kg)   BMI 31.62 kg/m  Last Weight:  Wt Readings from Last 1 Encounters:  06/15/20 190  lb (86.2 kg)   Last Height:   Ht Readings from Last 1 Encounters:  06/15/20 _1  (1.651 m)     Physical exam: Exam: Gen: NAD, conversant, well nourised, obese, well groomed                     CV: RRR, no MRG. No Carotid Bruits. No peripheral edema, warm, nontender Eyes: Conjunctivae clear without exudates or hemorrhage  Neuro: Detailed Neurologic Exam  Speech:    Speech is normal; fluent and spontaneous with normal comprehension.  Cognition:    The patient is oriented to person, place, and time;     recent and remote memory intact;     language fluent;     normal attention, concentration,     fund of knowledge Cranial Nerves:    The pupils are equal, round, and reactive to light. The fundi are normal and spontaneous venous pulsations are present. Visual fields are full to finger confrontation. Extraocular movements are intact. Trigeminal sensation is intact and the muscles of mastication are normal. The face is symmetric. The palate elevates in the midline. Hearing intact. Voice is normal. Shoulder shrug is normal. The tongue has normal motion without fasciculations.   Coordination:    Normal finger to nose and heel to shin. Normal rapid alternating movements.   Gait:    Heel-toe and tandem gait are normal.   Motor Observation:    No asymmetry, no atrophy, and no involuntary movements noted. Tone:    Normal muscle tone.    Posture:    Posture is normal. normal erect    Strength:    Strength is V/V in the upper and lower limbs.      Sensation: intact to LT     Reflex Exam:  DTR's:    Deep tendon reflexes in the upper and lower extremities are normal bilaterally.   Toes:    The toes are downgoing bilaterally.   Clonus:    Clonus is absent.    Assessment/Plan:  Really lovely 64 year old with worsening headaches likely progression of chronic migraines however needs thorough evaluation.  She is here with worsening migraines. She snores and wakes herself up. She  has morning headaches, recent fatigue.   MRI of the brain w/wo contrast: MRI brain due to concerning symptoms of morning headaches, positional headaches,vision changes  to look for space occupying mass, chiari or intracranial hypertension (pseudotumor) or other etiology.  Blood work Best boy as Administrator, arts once daily as needed for headache or migraine  Orders Placed This Encounter  Procedures  . MR BRAIN W WO CONTRAST  . Comprehensive metabolic panel  . CBC  . TSH  . Ambulatory referral to Sleep Studies   Meds ordered this encounter  Medications  . Rimegepant Sulfate (NURTEC) 75 MG TBDP    Sig: Take 75 mg by mouth daily as needed. For migraines. Take as close to onset of migraine as possible. One daily maximum.    Dispense:  8 tablet    Refill:  6    Dispense 8 or max allowed by insurance  . Galcanezumab-gnlm (EMGALITY) 120 MG/ML SOAJ  Sig: Inject 120 mg into the skin every 30 (thirty) days.    Dispense:  1.12 mL    Refill:  11    Discussed: To prevent or relieve headaches, try the following: Cool Compress. Lie down and place a cool compress on your head.  Avoid headache triggers. If certain foods or odors seem to have triggered your migraines in the past, avoid them. A headache diary might help you identify triggers.  Include physical activity in your daily routine. Try a daily walk or other moderate aerobic exercise.  Manage stress. Find healthy ways to cope with the stressors, such as delegating tasks on your to-do list.  Practice relaxation techniques. Try deep breathing, yoga, massage and visualization.  Eat regularly. Eating regularly scheduled meals and maintaining a healthy diet might help prevent headaches. Also, drink plenty of fluids.  Follow a regular sleep schedule. Sleep deprivation might contribute to headaches Consider biofeedback. With this mind-body technique, you learn to control certain bodily functions -- such as muscle tension,  heart rate and blood pressure -- to prevent headaches or reduce headache pain.    Proceed to emergency room if you experience new or worsening symptoms or symptoms do not resolve, if you have new neurologic symptoms or if headache is severe, or for any concerning symptom.   Provided education and documentation from American headache Society toolbox including articles on: chronic migraine medication overuse headache, chronic migraines, prevention of migraines, behavioral and other nonpharmacologic treatments for headache.   Cc: Lavone Orn, MD,  Lavone Orn, MD  Sarina Ill, MD  [facility] [address] [place], [place] [address]  Phone [phone] Fax [phone]  I spent more than 38mnutes of face-to-face and non-face-to-face time with patient on the  1. Chronic migraine without aura, with intractable migraine, so stated, with status migrainosus   2. Morning headache   3. Other fatigue   4. Worsening headaches   5. Positional headache   6. Vision changes    diagnosis.  This included previsit chart review, lab review, study review, order entry, electronic health record documentation, patient education on the different diagnostic and therapeutic options, counseling and coordination of care, risks and benefits of management, compliance, or risk factor reduction

## 2020-06-16 LAB — COMPREHENSIVE METABOLIC PANEL
ALT: 13 IU/L (ref 0–32)
AST: 19 IU/L (ref 0–40)
Albumin/Globulin Ratio: 1.5 (ref 1.2–2.2)
Albumin: 4.3 g/dL (ref 3.8–4.8)
Alkaline Phosphatase: 117 IU/L (ref 48–121)
BUN/Creatinine Ratio: 20 (ref 12–28)
BUN: 16 mg/dL (ref 8–27)
Bilirubin Total: <0.2 mg/dL (ref 0.0–1.2)
CO2: 29 mmol/L (ref 20–29)
Calcium: 9.4 mg/dL (ref 8.7–10.3)
Chloride: 99 mmol/L (ref 96–106)
Creatinine, Ser: 0.8 mg/dL (ref 0.57–1.00)
GFR calc Af Amer: 89 mL/min/{1.73_m2} (ref >59)
GFR calc non Af Amer: 77 mL/min/{1.73_m2} (ref >59)
Globulin, Total: 2.8 g/dL (ref 1.5–4.5)
Glucose: 97 mg/dL (ref 65–99)
Potassium: 4.1 mmol/L (ref 3.5–5.2)
Sodium: 139 mmol/L (ref 134–144)
Total Protein: 7.1 g/dL (ref 6.0–8.5)

## 2020-06-16 LAB — CBC
Hematocrit: 36.8 % (ref 34.0–46.6)
Hemoglobin: 12.7 g/dL (ref 11.1–15.9)
MCH: 31.2 pg (ref 26.6–33.0)
MCHC: 34.5 g/dL (ref 31.5–35.7)
MCV: 90 fL (ref 79–97)
Platelets: 241 10*3/uL (ref 150–450)
RBC: 4.07 x10E6/uL (ref 3.77–5.28)
RDW: 13.7 % (ref 11.7–15.4)
WBC: 7 10*3/uL (ref 3.4–10.8)

## 2020-06-16 LAB — TSH: TSH: 2.66 u[IU]/mL (ref 0.450–4.500)

## 2020-06-18 ENCOUNTER — Encounter: Payer: Self-pay | Admitting: Neurology

## 2020-06-18 DIAGNOSIS — G43711 Chronic migraine without aura, intractable, with status migrainosus: Secondary | ICD-10-CM | POA: Insufficient documentation

## 2020-06-19 ENCOUNTER — Telehealth: Payer: Self-pay | Admitting: Neurology

## 2020-06-19 NOTE — Telephone Encounter (Signed)
Medicare/bcbs supp order sent to GI. No auth they will reach out to the patient to schedule.  

## 2020-06-22 ENCOUNTER — Other Ambulatory Visit: Payer: Self-pay

## 2020-06-22 ENCOUNTER — Ambulatory Visit (INDEPENDENT_AMBULATORY_CARE_PROVIDER_SITE_OTHER): Payer: Medicare Other | Admitting: Cardiovascular Disease

## 2020-06-22 ENCOUNTER — Encounter: Payer: Self-pay | Admitting: Cardiovascular Disease

## 2020-06-22 VITALS — BP 132/90 | HR 71 | Ht 65.0 in | Wt 191.0 lb

## 2020-06-22 DIAGNOSIS — I350 Nonrheumatic aortic (valve) stenosis: Secondary | ICD-10-CM | POA: Diagnosis not present

## 2020-06-22 NOTE — Patient Instructions (Addendum)
COVID SCREENING INFORMATION 9/29: You are scheduled for your drive-thru COVID screening on: 07/05/2020 between 1PM and 3PM. Pre-Procedural COVID-19 Testing Site [address] [place], [place] [address] You will need to go home after your screening and quarantine until your procedure.    CATHETERIZATION INSTRUCTIONS (10/1): You are scheduled for a Cardiac Catheterization on: Friday, 07/07/2020  1. Please arrive at the [facility]Main Entr[facility]) at [facility]: [address] [place], [place] [address] at: 8:30AM. (This time is two hours before your procedure to ensure your preparation). Free valet parking service is available. You are allowed ONE visitor in the waiting room during your procedure. Both you and your guest must wear masks. Special note: Every effort is made to have your procedure done on time. Please understand that emergencies sometimes delay scheduled procedures.  2. Diet: Do not eat solid foods after midnight. You may have clear liquids until 5am upon the day of the procedure.  3. Medication instructions in preparation for your procedure:  1) HOLD DIOVAN the morning of your cath  2) TAKE ASPIRIN 81 mg the morning of your cath only  3) You may take your other medications as directed with sips of water   4. Plan for one night stay--bring personal belongings. 5. Bring a current list of your medications and current insurance cards. 6. You MUST have a responsible person to drive you home. 7. Someone MUST be with you the first 24 hours after you arrive home or your discharge will be delayed. 8. Please wear clothes that are easy to get on and off and wear slip-on shoes.  Thank you for allowing [place] to care for you!   -- [facility]

## 2020-06-22 NOTE — Progress Notes (Signed)
Cardiology Office Note:    Date:  06/22/2020   ID:  Rebecca Farley, DOB 01/01/1956, MRN [id number]  PCP:  Griffin, John, MD  [facility] Cardiologist:  Jayadeep Varanasi, MD  [facility] Electrophysiologist:  None   Referring MD: Griffin, John, MD   Chief Complaint  Patient presents with  . Shortness of Breath  . Aortic Stenosis    History of Present Illness:    Rebecca Farley is a 64 y.o. female referred by Dr Varanasi for evaluation of aortic stenosis.   The patient is here with her daughter today. She has known of a heart murmur for over a decade. Her 54 yo brother recently had aortic and mitral valve replacement this past year and her sister also has aortic stenosis. 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 She is originally from [place], [place], and has lived her for over 10 years. She has 3 children, one daughter here and 2 sons who live in [place]. She has been widowed since 1989.   The patient has longstanding shortness of breath and dizziness dating back many years. However, she reports that her symptoms have markedly progressed over the last 6 months. She has had near syncope but not frank syncope. She has some cough-associated chest pain but no exertional chest pain or pressure. She is short of breath with one flight of stairs or less than one city block of walking.  The patient also complains of generalized fatigue.  Past Medical History:  Diagnosis Date  . Atrial flutter (HCC) 01/2008   RADIOFREQUENCY ABLATION [place]  . Barrett esophagus   . Bee sting allergy   . Colitis   . Diverticulitis   . DJD (degenerative joint disease) of knee    KNEES, BACK (LUMBAR FACET DISEASE) AND HIPS, DALLDORF.  . Dyspnea    AT REST,ON EXERTION  . Dysrhythmia    AFIB  . Family history of carrier of genetic disease   . Family history of colon cancer   . Family history of liver cancer   . Family history of lung cancer   . Family history of  melanoma   . Family history of ovarian cancer   . Family history of prostate cancer   . GERD (gastroesophageal reflux disease)   . Heart murmur   . Hiatal hernia   . History of syncope    RECURRENT  . Hyperlipidemia    BORDERLINE  . Hypertension   . Migraines   . Obesity   . Osteopenia   . Pneumonia   . S/P RF ablation operation for arrhythmia   . Shoulder tendinitis, left 2008  . Systolic murmur 12/2014   MILD AS, GRADE I DIASTOLIC DYSFUNCTION, ECHO  . Ulcer     Past Surgical History:  Procedure Laterality Date  . ABDOMINAL HYSTERECTOMY    . BREAST BIOPSY Left    10 yrs +  . CESAREAN SECTION     1979, 1984  . CHOLECYSTECTOMY    . COLONOSCOPY  11/03/2012   Procedure: COLONOSCOPY;  Surgeon: Martin K Johnson, MD;  Location: [facility];  Service: Endoscopy;  Laterality: N/A;  . ESOPHAGOGASTRODUODENOSCOPY  11/03/2012   Procedure: ESOPHAGOGASTRODUODENOSCOPY (EGD);  Surgeon: Martin K Johnson, MD;  Location: [facility];  Service: Endoscopy;  Laterality: N/A;  . implanted loop device    . TOTAL HIP ARTHROPLASTY Right 11/23/2019   Procedure: RIGHT TOTAL HIP ARTHROPLASTY ANTERIOR APPROACH;  Surgeon: Dalldorf, Peter, MD;  Location: [facility];    Service: Orthopedics;  Laterality: Right;    Current Medications: Current Meds  Medication Sig  . Ascorbic Acid (VITAMIN C WITH ROSE HIPS) 500 MG tablet Take 500 mg by mouth daily.  Marland Kitchen b complex vitamins tablet Take 1 tablet by mouth daily.  . Calcium Carb-Cholecalciferol (CALCIUM+D3 PO) Take 1 tablet by mouth daily.  Marland Kitchen EPINEPHrine (EPIPEN 2-PAK) 0.3 mg/0.3 mL IJ SOAJ injection Inject 0.3 mg into the muscle as needed for anaphylaxis.  . folic acid (FOLVITE) 1 MG tablet Take 1 mg by mouth daily.  . Galcanezumab-gnlm (EMGALITY) 120 MG/ML SOAJ Inject 120 mg into the skin every 30 (thirty) days.  . Ginkgo Biloba 120 MG TABS Take 120 mg by mouth daily.  . Glucosamine-Chondroitin (COSAMIN DS PO) Take 1-2 tablets by mouth See admin instructions.  Take 2 tablets by mouth in the morning & take 1 tablet by mouth at night.  . Multiple Vitamin (MULTIVITAMIN WITH MINERALS) TABS tablet Take 1 tablet by mouth daily.  . Multiple Vitamins-Minerals (EMERGEN-C IMMUNE PLUS) PACK Take 1 packet by mouth daily.  . Omega-3 1000 MG CAPS Take 2,000 mg by mouth daily.  Marland Kitchen omeprazole (PRILOSEC) 20 MG capsule Take 20 mg by mouth daily before breakfast.   . Polyethyl Glycol-Propyl Glycol (LUBRICANT EYE DROPS) 0.4-0.3 % SOLN Place 1 drop into both eyes 3 (three) times daily as needed (dry/irritated eyes.).  Marland Kitchen Rimegepant Sulfate (NURTEC) 75 MG TBDP Take 75 mg by mouth daily as needed. For migraines. Take as close to onset of migraine as possible. One daily maximum.  . Turmeric 500 MG TABS Take 1,000 mg by mouth daily.  . valsartan-hydrochlorothiazide (DIOVAN-HCT) 160-12.5 MG tablet Take 1 tablet by mouth daily.      Allergies:   Penicillins, Codeine, Mobic [meloxicam], and Nsaids   Social History   Socioeconomic History  . Marital status: Widowed    Spouse name: Not on file  . Number of children: Not on file  . Years of education: Not on file  . Highest education level: Not on file  Occupational History  . Not on file  Tobacco Use  . Smoking status: Never Smoker  . Smokeless tobacco: Never Used  Vaping Use  . Vaping Use: Never used  Substance and Sexual Activity  . Alcohol use: Yes    Comment: OCCASIONALY  . Drug use: No  . Sexual activity: Not on file  Other Topics Concern  . Not on file  Social History Narrative  . Not on file   Social Determinants of Health   Financial Resource Strain:   . Difficulty of Paying Living Expenses: Not on file  Food Insecurity:   . Worried About Programme researcher, broadcasting/film/video in the Last Year: Not on file  . Ran Out of Food in the Last Year: Not on file  Transportation Needs:   . Lack of Transportation (Medical): Not on file  . Lack of Transportation (Non-Medical): Not on file  Physical Activity:   . Days of  Exercise per Week: Not on file  . Minutes of Exercise per Session: Not on file  Stress:   . Feeling of Stress : Not on file  Social Connections:   . Frequency of Communication with Friends and Family: Not on file  . Frequency of Social Gatherings with Friends and Family: Not on file  . Attends Religious Services: Not on file  . Active Member of Clubs or Organizations: Not on file  . Attends Banker Meetings: Not on file  . Marital  Status: Not on file     Family History: The patient's family history includes Aneurysm in her mother; Colon cancer (age of onset: 21) in her sister; Colon cancer (age of onset: 57) in her brother; Emphysema in her father; Heart failure in her mother; Hypertension in her mother; Idiopathic pulmonary fibrosis in her brother; Kidney failure in her mother; Liver cancer in her maternal uncle; Lung cancer in her father, maternal uncle, and paternal uncle; Melanoma in her sister; Melanoma (age of onset: 78) in her sister; Other in her sister, sister, sister, and sister; Ovarian cancer in her paternal grandmother; Prostate cancer in her father.  ROS:   Please see the history of present illness.    Back, hip, and knee pain. Right THA 6 months ago.  All other systems reviewed and are negative.  EKGs/Labs/Other Studies Reviewed:    The following studies were reviewed today: Echo 06/08/20: IMPRESSIONS    1. Since the last study on 04/14/2019 there has been a significant change -  aortic stenosis is now critical with mean gradient 62 mmHg.  2. Left ventricular ejection fraction, by estimation, is 60 to 65%. The  left ventricle has normal function. The left ventricle has no regional  wall motion abnormalities. There is moderate concentric left ventricular  hypertrophy. Left ventricular  diastolic parameters are consistent with Grade I diastolic dysfunction  (impaired relaxation). The average left ventricular global longitudinal  strain is -22.0 %. The global  longitudinal strain is normal.  3. Right ventricular systolic function is normal. The right ventricular  size is normal.  4. The mitral valve is normal in structure. Mild mitral valve  regurgitation. No evidence of mitral stenosis.  5. The aortic valve is normal in structure. Aortic valve regurgitation is  mild. Critical aortic stenosis. Aortic valve mean gradient measures 62.0  mmHg.  6. The inferior vena cava is normal in size with greater than 50%  respiratory variability, suggesting right atrial pressure of 3 mmHg.   Comparison(s): 04/14/19 EF >65%. Severe AS.   FINDINGS  Left Ventricle: Left ventricular ejection fraction, by estimation, is 60  to 65%. The left ventricle has normal function. The left ventricle has no  regional wall motion abnormalities. The average left ventricular global  longitudinal strain is -22.0 %.  The global longitudinal strain is normal. The left ventricular internal  cavity size was normal in size. There is moderate concentric left  ventricular hypertrophy. Left ventricular diastolic parameters are  consistent with Grade I diastolic dysfunction  (impaired relaxation). Normal left ventricular filling pressure.   Right Ventricle: The right ventricular size is normal. No increase in  right ventricular wall thickness. Right ventricular systolic function is  normal.   Left Atrium: Left atrial size was normal in size.   Right Atrium: Right atrial size was normal in size.   Pericardium: There is no evidence of pericardial effusion.   Mitral Valve: The mitral valve is normal in structure. There is moderate  thickening of the mitral valve leaflet(s). There is moderate calcification  of the mitral valve leaflet(s). Normal mobility of the mitral valve  leaflets. Mild mitral valve  regurgitation. No evidence of mitral valve stenosis.   Tricuspid Valve: The tricuspid valve is normal in structure. Tricuspid  valve regurgitation is mild . No evidence of  tricuspid stenosis.   Aortic Valve: The aortic valve is normal in structure.. There is severe  thickening and severe calcifcation of the aortic valve. Aortic valve  regurgitation is mild. Critical aortic stenosis. There  is severe  thickening of the aortic valve. There is severe  calcifcation of the aortic valve. Aortic valve mean gradient measures 62.0  mmHg. Aortic valve peak gradient measures 105.3 mmHg. Aortic valve area,  by VTI measures 0.80 cm.   Pulmonic Valve: The pulmonic valve was normal in structure. Pulmonic valve  regurgitation is not visualized. No evidence of pulmonic stenosis.   Aorta: The aortic root is normal in size and structure.   Venous: The inferior vena cava is normal in size with greater than 50%  respiratory variability, suggesting right atrial pressure of 3 mmHg.   IAS/Shunts: No atrial level shunt detected by color flow Doppler.   LEFT VENTRICLE  PLAX 2D  LVIDd:     3.10 cm Diastology  LVIDs:     2.10 cm LV e' lateral:  3.26 cm/s  LV PW:     1.30 cm LV E/e' lateral: 26.7  LV IVS:    1.50 cm LV e' medial:  4.68 cm/s  LVOT diam:   2.00 cm LV E/e' medial: 18.6  LV SV:     94  LV SV Index:  48    2D Longitudinal Strain  LVOT Area:   3.14 cm 2D Strain GLS (A2C):  -24.1 %             2D Strain GLS (A3C):  -20.0 %             2D Strain GLS (A4C):  -21.8 %             2D Strain GLS Avg:   -22.0 %               3D Volume EF:             3D EF:    59 %             LV EDV:    115 ml             LV ESV:    47 ml             LV SV:    68 ml   RIGHT VENTRICLE  RV Basal diam: 3.50 cm  RV S prime:   12.20 cm/s  TAPSE (M-mode): 1.6 cm   LEFT ATRIUM       Index    RIGHT ATRIUM      Index  LA diam:    3.40 cm 1.75 cm/m RA Pressure: 3.00 mmHg  LA Vol (A2C):  21.7 ml 11.15 ml/m  RA Area:   9.66 cm  LA Vol (A4C):  17.8 ml 9.15 ml/m RA Volume:  21.10 ml 10.84 ml/m  LA Biplane Vol: 20.4 ml 10.48 ml/m  AORTIC VALVE  AV Area (Vmax):  0.75 cm  AV Area (Vmean):  0.73 cm  AV Area (VTI):   0.80 cm  AV Vmax:      513.00 cm/s  AV Vmean:     341.200 cm/s  AV VTI:      1.170 m  AV Peak Grad:   105.3 mmHg  AV Mean Grad:   62.0 mmHg  LVOT Vmax:     123.00 cm/s  LVOT Vmean:    79.000 cm/s  LVOT VTI:     0.299 m  LVOT/AV VTI ratio: 0.26    AORTA  Ao Root diam: 3.50 cm  Ao Asc diam: 3.70 cm   MITRAL VALVE        TRICUSPID VALVE  Estimated RAP: 3.00 mmHg    MV E velocity: 86.90 cm/s  SHUNTS  MV A velocity: 138.00 cm/s Systemic VTI: 0.30 m  MV E/A ratio: 0.63     Systemic Diam: 2.00 cm   EKG:  EKG is ordered today. This shows a HR of 71 bpm, otherwise normal  Recent Labs: 06/15/2020: ALT 13; BUN 16; Creatinine, Ser 0.80; Hemoglobin 12.7; Platelets 241; Potassium 4.1; Sodium 139; TSH 2.660  Recent Lipid Panel No results found for: CHOL, TRIG, HDL, CHOLHDL, VLDL, LDLCALC, LDLDIRECT  Physical Exam:    VS:  BP 132/90   Pulse 71   Ht 5\' 5"  (1.651 m)   Wt 191 lb (86.6 kg)   SpO2 96%   BMI 31.78 kg/m     Wt Readings from Last 3 Encounters:  06/22/20 191 lb (86.6 kg)  06/15/20 190 lb (86.2 kg)  01/28/20 192 lb 6.4 oz (87.3 kg)     GEN:  Well nourished, well developed in no acute distress HEENT: Normal NECK: No JVD; bilateral carotid bruits LYMPHATICS: No lymphadenopathy CARDIAC: RRR, 4/6 harsh late peaking crescendo decrescendo systolic murmur with diminished A2 RESPIRATORY:  Clear to auscultation without rales, wheezing or rhonchi  ABDOMEN: Soft, non-tender, non-distended MUSCULOSKELETAL:  No edema; No deformity  SKIN: Warm and dry NEUROLOGIC:  Alert and oriented x 3 PSYCHIATRIC:  Normal affect   STS Risk Calculator: Isolated AVR: Risk of  Mortality: 1.123% Renal Failure: 1.253% Permanent Stroke: 1.200% Prolonged Ventilation: 3.900% DSW Infection: 0.077% Reoperation: 2.579% Morbidity or Mortality: 6.861% Short Length of Stay: 46.344% Long Length of Stay: 3.039%  ASSESSMENT:    1. Nonrheumatic aortic valve stenosis    PLAN:    In order of problems listed above:  30. 64 year old woman with severe, stage D1 aortic stenosis.  This is associated with New York Heart Association functional class III symptoms of chronic diastolic heart failure.  I have reviewed the natural history of aortic stenosis with the patient and their family members who are present today. We have discussed the limitations of medical therapy and the poor prognosis associated with symptomatic aortic stenosis. We have reviewed potential treatment options, including palliative medical therapy, conventional surgical aortic valve replacement, and transcatheter aortic valve replacement. We discussed treatment options in the context of the patient's specific comorbid medical conditions.  I have personally reviewed the patient's echo images today.  This demonstrates vigorous LV systolic function, severe calcification and restriction of all 3 aortic valve leaflets, and hemodynamic/Doppler findings outlined above that confirm very severe aortic stenosis with a mean transvalvular gradient of 62 mmHg and peak systolic velocity in excess of 5 m/s.  Treatment options are discussed at length.  Pros and cons of conventional surgical aortic valve replacement and TAVR are discussed extensively with the patient.  We contrasted the potential benefits of conventional surgery with known long-term durability data and very low risk of significant paravalvular regurgitation with the potential benefits of the less invasive TAVR with an expected quicker recovery and possibly lower perioperative risk.  She understands that further diagnostic studies are required to help determine her  best treatment option.  She will undergo CT angiography studies of the heart as well as the chest, abdomen, and pelvis to better understand her aortic valve anatomy as well as suitability of pelvic access for TAVR.  She will also undergo right and left heart catheterization to further assess hemodynamics and evaluate the presence of obstructive coronary artery disease. I have reviewed the risks, indications, and alternatives to cardiac  catheterization, possible angioplasty, and stenting with the patient. Risks include but are not limited to bleeding, infection, vascular injury, stroke, myocardial infection, arrhythmia, kidney injury, radiation-related injury in the case of prolonged fluoroscopy use, emergency cardiac surgery, and death. The patient understands the risks of serious complication is 1-2 in 1000 with diagnostic cardiac cath and 1-2% or less with angioplasty/stenting.  Once all of her diagnostic studies are completed, she will be referred for formal surgical consultation as part of a multidisciplinary heart team approach to her care.    Medication Adjustments/Labs and Tests Ordered: Current medicines are reviewed at length with the patient today.  Concerns regarding medicines are outlined above.  No orders of the defined types were placed in this encounter.  No orders of the defined types were placed in this encounter.   Patient Instructions  COVID SCREENING INFORMATION: You are scheduled for your drive-thru COVID screening on: Pre-Procedural COVID-19 Testing Site [address] [place], [place] [address] You will need to go home after your screening and quarantine until your procedure.     CATHETERIZATION INSTRUCTIONS: You are scheduled for a Cardiac Catheterization on:  1. Please arrive [facility]: [address] [place], [place] [address] at:   (This time is two hours before your procedure to ensure your preparation). Free valet  parking service is available. You are allowed ONE visitor in the waiting room during your procedure. Both you and your guest must wear masks. Special note: Every effort is made to have your procedure done on time. Please understand that emergencies sometimes delay scheduled procedures.  2. Diet: Do not eat solid foods after midnight. You may have clear liquids until 5am upon the day of the procedure.  3. Medication instructions in preparation for your procedure:  1) HOLD DIOVAN the morning of your cath  2) TAKE ASPIRIN 81 mg the morning of your cath only  3) You may take your other medications as directed with sips of water   4. Plan for one night stay--bring personal belongings. 5. Bring a current list of your medications and current insurance cards. 6. You MUST have a responsible person to drive you home. 7. Someone MUST be with you the first 24 hours after you arrive home or your discharge will be delayed. 8. Please wear clothes that are easy to get on and off and wear slip-on shoes.  Thank you for allowing us to care for you!   -- [facility]     Signed, Tonny BollmanMichael Graycee Greeson, MD  06/22/2020 4:07 PM    [facility]

## 2020-06-22 NOTE — H&P (View-Only) (Signed)
Cardiology Office Note:    Date:  06/22/2020   ID:  Rebecca Farley, DOB 03-Aug-1956, MRN [id number]  PCP:  Kirby Funk, MD  [facility] Cardiologist:  Lance Muss, MD  [facility] Electrophysiologist:  None   Referring MD: Kirby Funk, MD   Chief Complaint  Patient presents with  . Shortness of Breath  . Aortic Stenosis    History of Present Illness:    Rebecca Farley is a 64 y.o. female referred by Dr Eldridge Dace for evaluation of aortic stenosis.   The patient is here with her daughter today. She has known of a heart murmur for over a decade. Her 58 yo brother recently had aortic and mitral valve replacement this past year and her sister also has aortic stenosis. 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 She is originally from [place], [place], and has lived her for over 10 years. She has 3 children, one daughter here and 2 sons who live in [place]. She has been widowed since 1989.   The patient has longstanding shortness of breath and dizziness dating back many years. However, she reports that her symptoms have markedly progressed over the last 6 months. She has had near syncope but not frank syncope. She has some cough-associated chest pain but no exertional chest pain or pressure. She is short of breath with one flight of stairs or less than one city block of walking.  The patient also complains of generalized fatigue.  Past Medical History:  Diagnosis Date  . Atrial flutter (HCC) 01/2008   RADIOFREQUENCY ABLATION [place]  . Barrett esophagus   . Bee sting allergy   . Colitis   . Diverticulitis   . DJD (degenerative joint disease) of knee    KNEES, BACK (LUMBAR FACET DISEASE) AND HIPS, DALLDORF.  Marland Kitchen Dyspnea    AT REST,ON EXERTION  . Dysrhythmia    AFIB  . Family history of carrier of genetic disease   . Family history of colon cancer   . Family history of liver cancer   . Family history of lung cancer   . Family history of  melanoma   . Family history of ovarian cancer   . Family history of prostate cancer   . GERD (gastroesophageal reflux disease)   . Heart murmur   . Hiatal hernia   . History of syncope    RECURRENT  . Hyperlipidemia    BORDERLINE  . Hypertension   . Migraines   . Obesity   . Osteopenia   . Pneumonia   . S/P RF ablation operation for arrhythmia   . Shoulder tendinitis, left 2008  . Systolic murmur 12/2014   MILD AS, GRADE I DIASTOLIC DYSFUNCTION, ECHO  . Ulcer     Past Surgical History:  Procedure Laterality Date  . ABDOMINAL HYSTERECTOMY    . BREAST BIOPSY Left    10 yrs +  . CESAREAN SECTION     1979, 1984  . CHOLECYSTECTOMY    . COLONOSCOPY  11/03/2012   Procedure: COLONOSCOPY;  Surgeon: Charolett Bumpers, MD;  Location: [facility];  Service: Endoscopy;  Laterality: N/A;  . ESOPHAGOGASTRODUODENOSCOPY  11/03/2012   Procedure: ESOPHAGOGASTRODUODENOSCOPY (EGD);  Surgeon: Charolett Bumpers, MD;  Location: [facility];  Service: Endoscopy;  Laterality: N/A;  . implanted loop device    . TOTAL HIP ARTHROPLASTY Right 11/23/2019   Procedure: RIGHT TOTAL HIP ARTHROPLASTY ANTERIOR APPROACH;  Surgeon: Marcene Corning, MD;  Location: [facility];  Service: Orthopedics;  Laterality: Right;    Current Medications: Current Meds  Medication Sig  . Ascorbic Acid (VITAMIN C WITH ROSE HIPS) 500 MG tablet Take 500 mg by mouth daily.  Marland Kitchen b complex vitamins tablet Take 1 tablet by mouth daily.  . Calcium Carb-Cholecalciferol (CALCIUM+D3 PO) Take 1 tablet by mouth daily.  Marland Kitchen EPINEPHrine (EPIPEN 2-PAK) 0.3 mg/0.3 mL IJ SOAJ injection Inject 0.3 mg into the muscle as needed for anaphylaxis.  . folic acid (FOLVITE) 1 MG tablet Take 1 mg by mouth daily.  . Galcanezumab-gnlm (EMGALITY) 120 MG/ML SOAJ Inject 120 mg into the skin every 30 (thirty) days.  . Ginkgo Biloba 120 MG TABS Take 120 mg by mouth daily.  . Glucosamine-Chondroitin (COSAMIN DS PO) Take 1-2 tablets by mouth See admin instructions.  Take 2 tablets by mouth in the morning & take 1 tablet by mouth at night.  . Multiple Vitamin (MULTIVITAMIN WITH MINERALS) TABS tablet Take 1 tablet by mouth daily.  . Multiple Vitamins-Minerals (EMERGEN-C IMMUNE PLUS) PACK Take 1 packet by mouth daily.  . Omega-3 1000 MG CAPS Take 2,000 mg by mouth daily.  Marland Kitchen omeprazole (PRILOSEC) 20 MG capsule Take 20 mg by mouth daily before breakfast.   . Polyethyl Glycol-Propyl Glycol (LUBRICANT EYE DROPS) 0.4-0.3 % SOLN Place 1 drop into both eyes 3 (three) times daily as needed (dry/irritated eyes.).  Marland Kitchen Rimegepant Sulfate (NURTEC) 75 MG TBDP Take 75 mg by mouth daily as needed. For migraines. Take as close to onset of migraine as possible. One daily maximum.  . Turmeric 500 MG TABS Take 1,000 mg by mouth daily.  . valsartan-hydrochlorothiazide (DIOVAN-HCT) 160-12.5 MG tablet Take 1 tablet by mouth daily.      Allergies:   Penicillins, Codeine, Mobic [meloxicam], and Nsaids   Social History   Socioeconomic History  . Marital status: Widowed    Spouse name: Not on file  . Number of children: Not on file  . Years of education: Not on file  . Highest education level: Not on file  Occupational History  . Not on file  Tobacco Use  . Smoking status: Never Smoker  . Smokeless tobacco: Never Used  Vaping Use  . Vaping Use: Never used  Substance and Sexual Activity  . Alcohol use: Yes    Comment: OCCASIONALY  . Drug use: No  . Sexual activity: Not on file  Other Topics Concern  . Not on file  Social History Narrative  . Not on file   Social Determinants of Health   Financial Resource Strain:   . Difficulty of Paying Living Expenses: Not on file  Food Insecurity:   . Worried About Programme researcher, broadcasting/film/video in the Last Year: Not on file  . Ran Out of Food in the Last Year: Not on file  Transportation Needs:   . Lack of Transportation (Medical): Not on file  . Lack of Transportation (Non-Medical): Not on file  Physical Activity:   . Days of  Exercise per Week: Not on file  . Minutes of Exercise per Session: Not on file  Stress:   . Feeling of Stress : Not on file  Social Connections:   . Frequency of Communication with Friends and Family: Not on file  . Frequency of Social Gatherings with Friends and Family: Not on file  . Attends Religious Services: Not on file  . Active Member of Clubs or Organizations: Not on file  . Attends Banker Meetings: Not on file  . Marital  Status: Not on file     Family History: The patient's family history includes Aneurysm in her mother; Colon cancer (age of onset: 21) in her sister; Colon cancer (age of onset: 57) in her brother; Emphysema in her father; Heart failure in her mother; Hypertension in her mother; Idiopathic pulmonary fibrosis in her brother; Kidney failure in her mother; Liver cancer in her maternal uncle; Lung cancer in her father, maternal uncle, and paternal uncle; Melanoma in her sister; Melanoma (age of onset: 78) in her sister; Other in her sister, sister, sister, and sister; Ovarian cancer in her paternal grandmother; Prostate cancer in her father.  ROS:   Please see the history of present illness.    Back, hip, and knee pain. Right THA 6 months ago.  All other systems reviewed and are negative.  EKGs/Labs/Other Studies Reviewed:    The following studies were reviewed today: Echo 06/08/20: IMPRESSIONS    1. Since the last study on 04/14/2019 there has been a significant change -  aortic stenosis is now critical with mean gradient 62 mmHg.  2. Left ventricular ejection fraction, by estimation, is 60 to 65%. The  left ventricle has normal function. The left ventricle has no regional  wall motion abnormalities. There is moderate concentric left ventricular  hypertrophy. Left ventricular  diastolic parameters are consistent with Grade I diastolic dysfunction  (impaired relaxation). The average left ventricular global longitudinal  strain is -22.0 %. The global  longitudinal strain is normal.  3. Right ventricular systolic function is normal. The right ventricular  size is normal.  4. The mitral valve is normal in structure. Mild mitral valve  regurgitation. No evidence of mitral stenosis.  5. The aortic valve is normal in structure. Aortic valve regurgitation is  mild. Critical aortic stenosis. Aortic valve mean gradient measures 62.0  mmHg.  6. The inferior vena cava is normal in size with greater than 50%  respiratory variability, suggesting right atrial pressure of 3 mmHg.   Comparison(s): 04/14/19 EF >65%. Severe AS.   FINDINGS  Left Ventricle: Left ventricular ejection fraction, by estimation, is 60  to 65%. The left ventricle has normal function. The left ventricle has no  regional wall motion abnormalities. The average left ventricular global  longitudinal strain is -22.0 %.  The global longitudinal strain is normal. The left ventricular internal  cavity size was normal in size. There is moderate concentric left  ventricular hypertrophy. Left ventricular diastolic parameters are  consistent with Grade I diastolic dysfunction  (impaired relaxation). Normal left ventricular filling pressure.   Right Ventricle: The right ventricular size is normal. No increase in  right ventricular wall thickness. Right ventricular systolic function is  normal.   Left Atrium: Left atrial size was normal in size.   Right Atrium: Right atrial size was normal in size.   Pericardium: There is no evidence of pericardial effusion.   Mitral Valve: The mitral valve is normal in structure. There is moderate  thickening of the mitral valve leaflet(s). There is moderate calcification  of the mitral valve leaflet(s). Normal mobility of the mitral valve  leaflets. Mild mitral valve  regurgitation. No evidence of mitral valve stenosis.   Tricuspid Valve: The tricuspid valve is normal in structure. Tricuspid  valve regurgitation is mild . No evidence of  tricuspid stenosis.   Aortic Valve: The aortic valve is normal in structure.. There is severe  thickening and severe calcifcation of the aortic valve. Aortic valve  regurgitation is mild. Critical aortic stenosis. There  is severe  thickening of the aortic valve. There is severe  calcifcation of the aortic valve. Aortic valve mean gradient measures 62.0  mmHg. Aortic valve peak gradient measures 105.3 mmHg. Aortic valve area,  by VTI measures 0.80 cm.   Pulmonic Valve: The pulmonic valve was normal in structure. Pulmonic valve  regurgitation is not visualized. No evidence of pulmonic stenosis.   Aorta: The aortic root is normal in size and structure.   Venous: The inferior vena cava is normal in size with greater than 50%  respiratory variability, suggesting right atrial pressure of 3 mmHg.   IAS/Shunts: No atrial level shunt detected by color flow Doppler.   LEFT VENTRICLE  PLAX 2D  LVIDd:     3.10 cm Diastology  LVIDs:     2.10 cm LV e' lateral:  3.26 cm/s  LV PW:     1.30 cm LV E/e' lateral: 26.7  LV IVS:    1.50 cm LV e' medial:  4.68 cm/s  LVOT diam:   2.00 cm LV E/e' medial: 18.6  LV SV:     94  LV SV Index:  48    2D Longitudinal Strain  LVOT Area:   3.14 cm 2D Strain GLS (A2C):  -24.1 %             2D Strain GLS (A3C):  -20.0 %             2D Strain GLS (A4C):  -21.8 %             2D Strain GLS Avg:   -22.0 %               3D Volume EF:             3D EF:    59 %             LV EDV:    115 ml             LV ESV:    47 ml             LV SV:    68 ml   RIGHT VENTRICLE  RV Basal diam: 3.50 cm  RV S prime:   12.20 cm/s  TAPSE (M-mode): 1.6 cm   LEFT ATRIUM       Index    RIGHT ATRIUM      Index  LA diam:    3.40 cm 1.75 cm/m RA Pressure: 3.00 mmHg  LA Vol (A2C):  21.7 ml 11.15 ml/m  RA Area:   9.66 cm  LA Vol (A4C):  17.8 ml 9.15 ml/m RA Volume:  21.10 ml 10.84 ml/m  LA Biplane Vol: 20.4 ml 10.48 ml/m  AORTIC VALVE  AV Area (Vmax):  0.75 cm  AV Area (Vmean):  0.73 cm  AV Area (VTI):   0.80 cm  AV Vmax:      513.00 cm/s  AV Vmean:     341.200 cm/s  AV VTI:      1.170 m  AV Peak Grad:   105.3 mmHg  AV Mean Grad:   62.0 mmHg  LVOT Vmax:     123.00 cm/s  LVOT Vmean:    79.000 cm/s  LVOT VTI:     0.299 m  LVOT/AV VTI ratio: 0.26    AORTA  Ao Root diam: 3.50 cm  Ao Asc diam: 3.70 cm   MITRAL VALVE        TRICUSPID VALVE  Estimated RAP: 3.00 mmHg    MV E velocity: 86.90 cm/s  SHUNTS  MV A velocity: 138.00 cm/s Systemic VTI: 0.30 m  MV E/A ratio: 0.63     Systemic Diam: 2.00 cm   EKG:  EKG is ordered today. This shows a HR of 71 bpm, otherwise normal  Recent Labs: 06/15/2020: ALT 13; BUN 16; Creatinine, Ser 0.80; Hemoglobin 12.7; Platelets 241; Potassium 4.1; Sodium 139; TSH 2.660  Recent Lipid Panel No results found for: CHOL, TRIG, HDL, CHOLHDL, VLDL, LDLCALC, LDLDIRECT  Physical Exam:    VS:  BP 132/90   Pulse 71   Ht 5\' 5"  (1.651 m)   Wt 191 lb (86.6 kg)   SpO2 96%   BMI 31.78 kg/m     Wt Readings from Last 3 Encounters:  06/22/20 191 lb (86.6 kg)  06/15/20 190 lb (86.2 kg)  01/28/20 192 lb 6.4 oz (87.3 kg)     GEN:  Well nourished, well developed in no acute distress HEENT: Normal NECK: No JVD; bilateral carotid bruits LYMPHATICS: No lymphadenopathy CARDIAC: RRR, 4/6 harsh late peaking crescendo decrescendo systolic murmur with diminished A2 RESPIRATORY:  Clear to auscultation without rales, wheezing or rhonchi  ABDOMEN: Soft, non-tender, non-distended MUSCULOSKELETAL:  No edema; No deformity  SKIN: Warm and dry NEUROLOGIC:  Alert and oriented x 3 PSYCHIATRIC:  Normal affect   STS Risk Calculator: Isolated AVR: Risk of  Mortality: 1.123% Renal Failure: 1.253% Permanent Stroke: 1.200% Prolonged Ventilation: 3.900% DSW Infection: 0.077% Reoperation: 2.579% Morbidity or Mortality: 6.861% Short Length of Stay: 46.344% Long Length of Stay: 3.039%  ASSESSMENT:    1. Nonrheumatic aortic valve stenosis    PLAN:    In order of problems listed above:  30. 64 year old woman with severe, stage D1 aortic stenosis.  This is associated with New York Heart Association functional class III symptoms of chronic diastolic heart failure.  I have reviewed the natural history of aortic stenosis with the patient and their family members who are present today. We have discussed the limitations of medical therapy and the poor prognosis associated with symptomatic aortic stenosis. We have reviewed potential treatment options, including palliative medical therapy, conventional surgical aortic valve replacement, and transcatheter aortic valve replacement. We discussed treatment options in the context of the patient's specific comorbid medical conditions.  I have personally reviewed the patient's echo images today.  This demonstrates vigorous LV systolic function, severe calcification and restriction of all 3 aortic valve leaflets, and hemodynamic/Doppler findings outlined above that confirm very severe aortic stenosis with a mean transvalvular gradient of 62 mmHg and peak systolic velocity in excess of 5 m/s.  Treatment options are discussed at length.  Pros and cons of conventional surgical aortic valve replacement and TAVR are discussed extensively with the patient.  We contrasted the potential benefits of conventional surgery with known long-term durability data and very low risk of significant paravalvular regurgitation with the potential benefits of the less invasive TAVR with an expected quicker recovery and possibly lower perioperative risk.  She understands that further diagnostic studies are required to help determine her  best treatment option.  She will undergo CT angiography studies of the heart as well as the chest, abdomen, and pelvis to better understand her aortic valve anatomy as well as suitability of pelvic access for TAVR.  She will also undergo right and left heart catheterization to further assess hemodynamics and evaluate the presence of obstructive coronary artery disease. I have reviewed the risks, indications, and alternatives to cardiac  catheterization, possible angioplasty, and stenting with the patient. Risks include but are not limited to bleeding, infection, vascular injury, stroke, myocardial infection, arrhythmia, kidney injury, radiation-related injury in the case of prolonged fluoroscopy use, emergency cardiac surgery, and death. The patient understands the risks of serious complication is 1-2 in 1000 with diagnostic cardiac cath and 1-2% or less with angioplasty/stenting.  Once all of her diagnostic studies are completed, she will be referred for formal surgical consultation as part of a multidisciplinary heart team approach to her care.    Medication Adjustments/Labs and Tests Ordered: Current medicines are reviewed at length with the patient today.  Concerns regarding medicines are outlined above.  No orders of the defined types were placed in this encounter.  No orders of the defined types were placed in this encounter.   Patient Instructions  COVID SCREENING INFORMATION: You are scheduled for your drive-thru COVID screening on: Pre-Procedural COVID-19 Testing Site [address] [place], [place] [address] You will need to go home after your screening and quarantine until your procedure.     CATHETERIZATION INSTRUCTIONS: You are scheduled for a Cardiac Catheterization on:  1. Please arrive [facility] A[facility] at [facility]: [address] [place], [place] [address] at:   (This time is two hours before your procedure to ensure your preparation). Free valet  parking service is available. You are allowed ONE visitor in the waiting room during your procedure. Both you and your guest must wear masks. Special note: Every effort is made to have your procedure done on time. Please understand that emergencies sometimes delay scheduled procedures.  2. Diet: Do not eat solid foods after midnight. You may have clear liquids until 5am upon the day of the procedure.  3. Medication instructions in preparation for your procedure:  1) HOLD DIOVAN the morning of your cath  2) TAKE ASPIRIN 81 mg the morning of your cath only  3) You may take your other medications as directed with sips of water   4. Plan for one night stay--bring personal belongings. 5. Bring a current list of your medications and current insurance cards. 6. You MUST have a responsible person to drive you home. 7. Someone MUST be with you the first 24 hours after you arrive home or your discharge will be delayed. 8. Please wear clothes that are easy to get on and off and wear slip-on shoes.  Thank you for allowing us to care for you!   -- [facility]     Signed, Tonny BollmanMichael Tacora Athanas, MD  06/22/2020 4:07 PM    [facility]

## 2020-06-28 ENCOUNTER — Other Ambulatory Visit: Payer: Self-pay

## 2020-06-28 MED ORDER — METOPROLOL TARTRATE 50 MG PO TABS: ORAL_TABLET | ORAL | 0 refills | Status: DC

## 2020-06-28 MED FILL — METOPROLOL TARTRATE 50 MG T: 50 | 1 days supply | Qty: 1 | Fill #0

## 2020-06-29 ENCOUNTER — Other Ambulatory Visit: Payer: Self-pay | Admitting: Cardiovascular Disease

## 2020-07-03 ENCOUNTER — Institutional Professional Consult (permissible substitution): Payer: Medicare Other | Admitting: Cardiovascular Disease

## 2020-07-05 ENCOUNTER — Other Ambulatory Visit (HOSPITAL_COMMUNITY)
Admission: RE | Admit: 2020-07-05 | Discharge: 2020-07-05 | Disposition: A | Discharge status: Home / Self Care | Payer: Medicare Other | Source: Ambulatory Visit | Attending: Cardiovascular Disease | Admitting: Cardiovascular Disease

## 2020-07-05 DIAGNOSIS — Z20822 Contact with and (suspected) exposure to covid-19: Secondary | ICD-10-CM | POA: Diagnosis not present

## 2020-07-05 DIAGNOSIS — Z01812 Encounter for preprocedural laboratory examination: Secondary | ICD-10-CM | POA: Insufficient documentation

## 2020-07-05 LAB — SARS CORONAVIRUS 2 (TAT 6-24 HRS): SARS Coronavirus 2: NEGATIVE

## 2020-07-06 ENCOUNTER — Telehealth: Payer: Self-pay | Admitting: *Deleted

## 2020-07-06 NOTE — Telephone Encounter (Signed)
Pt contacted pre-catheterization scheduled at [facility] for: Friday July 07, 2020 10:30 AM Verified arrival time and place: [facility] Main Entrance A [facility]) at: 8:30 AM  No solid food after midnight prior to cath, clear liquids until 5 AM day of procedure.  Hold: Valsartan-HCT-AM of procedure  Except hold medications AM meds can be  taken pre-cath with sips of water including: ASA 81 mg   Confirmed patient has responsible adult to drive home post procedure and be with patient first 24 hours after arriving home: yes  You are allowed ONE visitor in the waiting room during the time you are at the hospital for your procedure. Both you and your visitor must wear a mask once you enter the hospital.       COVID-19 Pre-Screening Questions:   In the past 14 days have you had a new cough, new headache, new nasal congestion, fever (100.4 or greater) unexplained body aches, new sore throat, or sudden loss of taste or sense of smell? no  In the past 14 days have you been around anyone with known Covid 19? no  Have you been vaccinated for COVID-19? Yes, see immunization history    Reviewed procedure/mask/visitor instructions, COVID-19 questions with patient.

## 2020-07-07 ENCOUNTER — Encounter: Payer: Self-pay | Admitting: Physician Assistant

## 2020-07-07 ENCOUNTER — Ambulatory Visit (HOSPITAL_COMMUNITY)
Admission: RE | Admit: 2020-07-07 | Discharge: 2020-07-07 | Disposition: A | Discharge status: Home / Self Care | Payer: Medicare Other | Attending: Cardiovascular Disease | Admitting: Cardiovascular Disease

## 2020-07-07 ENCOUNTER — Encounter (HOSPITAL_COMMUNITY)
Admission: RE | Disposition: A | Discharge status: Home / Self Care | Payer: Medicare Other | Source: Home / Self Care | Attending: Cardiovascular Disease

## 2020-07-07 ENCOUNTER — Other Ambulatory Visit: Payer: Self-pay

## 2020-07-07 DIAGNOSIS — I110 Hypertensive heart disease with heart failure: Secondary | ICD-10-CM | POA: Diagnosis not present

## 2020-07-07 DIAGNOSIS — I4891 Unspecified atrial fibrillation: Secondary | ICD-10-CM | POA: Insufficient documentation

## 2020-07-07 DIAGNOSIS — I10 Essential (primary) hypertension: Secondary | ICD-10-CM | POA: Insufficient documentation

## 2020-07-07 DIAGNOSIS — Z6831 Body mass index (BMI) 31.0-31.9, adult: Secondary | ICD-10-CM | POA: Diagnosis not present

## 2020-07-07 DIAGNOSIS — R0602 Shortness of breath: Secondary | ICD-10-CM | POA: Insufficient documentation

## 2020-07-07 DIAGNOSIS — E669 Obesity, unspecified: Secondary | ICD-10-CM | POA: Insufficient documentation

## 2020-07-07 DIAGNOSIS — K2270 Barrett's esophagus without dysplasia: Secondary | ICD-10-CM | POA: Insufficient documentation

## 2020-07-07 DIAGNOSIS — K219 Gastro-esophageal reflux disease without esophagitis: Secondary | ICD-10-CM | POA: Insufficient documentation

## 2020-07-07 DIAGNOSIS — I352 Nonrheumatic aortic (valve) stenosis with insufficiency: Secondary | ICD-10-CM | POA: Diagnosis present

## 2020-07-07 DIAGNOSIS — I5032 Chronic diastolic (congestive) heart failure: Secondary | ICD-10-CM | POA: Diagnosis not present

## 2020-07-07 DIAGNOSIS — E785 Hyperlipidemia, unspecified: Secondary | ICD-10-CM | POA: Insufficient documentation

## 2020-07-07 DIAGNOSIS — Z8679 Personal history of other diseases of the circulatory system: Secondary | ICD-10-CM | POA: Insufficient documentation

## 2020-07-07 DIAGNOSIS — Z79899 Other long term (current) drug therapy: Secondary | ICD-10-CM | POA: Insufficient documentation

## 2020-07-07 DIAGNOSIS — Z9889 Other specified postprocedural states: Secondary | ICD-10-CM | POA: Insufficient documentation

## 2020-07-07 DIAGNOSIS — Z87898 Personal history of other specified conditions: Secondary | ICD-10-CM | POA: Insufficient documentation

## 2020-07-07 DIAGNOSIS — I350 Nonrheumatic aortic (valve) stenosis: Secondary | ICD-10-CM | POA: Diagnosis not present

## 2020-07-07 HISTORY — PX: RIGHT HEART CATH AND CORONARY ANGIOGRAPHY: CATH118264

## 2020-07-07 LAB — POCT I-STAT EG7
Acid-Base Excess: 2 mmol/L (ref 0.0–2.0)
Acid-Base Excess: 3 mmol/L — ABNORMAL HIGH (ref 0.0–2.0)
Bicarbonate: 27.7 mmol/L (ref 20.0–28.0)
Bicarbonate: 27.9 mmol/L (ref 20.0–28.0)
Calcium, Ion: 1.14 mmol/L — ABNORMAL LOW (ref 1.15–1.40)
Calcium, Ion: 1.19 mmol/L (ref 1.15–1.40)
HCT: 33 % — ABNORMAL LOW (ref 36.0–46.0)
HCT: 36 % (ref 36.0–46.0)
Hemoglobin: 11.2 g/dL — ABNORMAL LOW (ref 12.0–15.0)
Hemoglobin: 12.2 g/dL (ref 12.0–15.0)
O2 Saturation: 81 %
O2 Saturation: 84 %
Potassium: 3.4 mmol/L — ABNORMAL LOW (ref 3.5–5.1)
Potassium: 3.5 mmol/L (ref 3.5–5.1)
Sodium: 142 mmol/L (ref 135–145)
Sodium: 142 mmol/L (ref 135–145)
TCO2: 29 mmol/L (ref 22–32)
TCO2: 29 mmol/L (ref 22–32)
pCO2, Ven: 43.9 mmHg — ABNORMAL LOW (ref 44.0–60.0)
pCO2, Ven: 46.5 mmHg (ref 44.0–60.0)
pH, Ven: 7.384 (ref 7.250–7.430)
pH, Ven: 7.412 (ref 7.250–7.430)
pO2, Ven: 47 mmHg — ABNORMAL HIGH (ref 32.0–45.0)
pO2, Ven: 49 mmHg — ABNORMAL HIGH (ref 32.0–45.0)

## 2020-07-07 LAB — POCT I-STAT 7, (LYTES, BLD GAS, ICA,H+H)
Acid-Base Excess: 3 mmol/L — ABNORMAL HIGH (ref 0.0–2.0)
Bicarbonate: 28.1 mmol/L — ABNORMAL HIGH (ref 20.0–28.0)
Calcium, Ion: 1.2 mmol/L (ref 1.15–1.40)
HCT: 36 % (ref 36.0–46.0)
Hemoglobin: 12.2 g/dL (ref 12.0–15.0)
O2 Saturation: 99 %
Potassium: 3.6 mmol/L (ref 3.5–5.1)
Sodium: 142 mmol/L (ref 135–145)
TCO2: 29 mmol/L (ref 22–32)
pCO2 arterial: 43.3 mmHg (ref 32.0–48.0)
pH, Arterial: 7.42 (ref 7.350–7.450)
pO2, Arterial: 128 mmHg — ABNORMAL HIGH (ref 83.0–108.0)

## 2020-07-07 SURGERY — RIGHT HEART CATH AND CORONARY ANGIOGRAPHY
Anesthesia: LOCAL

## 2020-07-07 MED ORDER — HEPARIN (PORCINE) IN NACL 1000-0.9 UT/500ML-% IV SOLN
INTRAVENOUS | Status: DC | PRN
  Administered 2020-07-07 (×2): 500 mL

## 2020-07-07 MED ORDER — SODIUM CHLORIDE 0.9% FLUSH: 3.0000 mL | Freq: Two times a day (BID) | INTRAVENOUS | Status: DC

## 2020-07-07 MED ORDER — LIDOCAINE HCL (PF) 1 % IJ SOLN
INTRAMUSCULAR | Status: DC | PRN
  Administered 2020-07-07 (×2): 2 mL

## 2020-07-07 MED ORDER — FENTANYL CITRATE (PF) 100 MCG/2ML IJ SOLN
INTRAMUSCULAR | Status: AC
  Filled 2020-07-07: qty 2

## 2020-07-07 MED ORDER — LABETALOL HCL 5 MG/ML IV SOLN: 10.0000 mg | INTRAVENOUS | Status: DC | PRN

## 2020-07-07 MED ORDER — SODIUM CHLORIDE 0.9 % WEIGHT BASED INFUSION: 1.0000 mL/kg/h | INTRAVENOUS | Status: DC

## 2020-07-07 MED ORDER — ONDANSETRON HCL 4 MG/2ML IJ SOLN: 4.0000 mg | Freq: Four times a day (QID) | INTRAMUSCULAR | Status: DC | PRN

## 2020-07-07 MED ORDER — IOHEXOL 350 MG/ML SOLN
INTRAVENOUS | Status: DC | PRN
  Administered 2020-07-07: 40 mL via INTRA_ARTERIAL

## 2020-07-07 MED ORDER — SODIUM CHLORIDE 0.9% FLUSH: 3.0000 mL | INTRAVENOUS | Status: DC | PRN

## 2020-07-07 MED ORDER — VERAPAMIL HCL 2.5 MG/ML IV SOLN
INTRAVENOUS | Status: DC | PRN
  Administered 2020-07-07: 10 mL via INTRA_ARTERIAL

## 2020-07-07 MED ORDER — HEPARIN SODIUM (PORCINE) 1000 UNIT/ML IJ SOLN
INTRAMUSCULAR | Status: DC | PRN
  Administered 2020-07-07: 4000 [IU] via INTRAVENOUS

## 2020-07-07 MED ORDER — VERAPAMIL HCL 2.5 MG/ML IV SOLN
INTRAVENOUS | Status: AC
  Filled 2020-07-07: qty 2

## 2020-07-07 MED ORDER — LIDOCAINE HCL (PF) 1 % IJ SOLN
INTRAMUSCULAR | Status: AC
  Filled 2020-07-07: qty 30

## 2020-07-07 MED ORDER — SODIUM CHLORIDE 0.9 % IV SOLN: 250.0000 mL | INTRAVENOUS | Status: DC | PRN

## 2020-07-07 MED ORDER — HEPARIN SODIUM (PORCINE) 1000 UNIT/ML IJ SOLN
INTRAMUSCULAR | Status: AC
  Filled 2020-07-07: qty 1

## 2020-07-07 MED ORDER — ASPIRIN 81 MG PO CHEW: 81.0000 mg | CHEWABLE_TABLET | ORAL | Status: DC

## 2020-07-07 MED ORDER — MIDAZOLAM HCL 2 MG/2ML IJ SOLN
INTRAMUSCULAR | Status: AC
  Filled 2020-07-07: qty 2

## 2020-07-07 MED ORDER — FENTANYL CITRATE (PF) 100 MCG/2ML IJ SOLN
INTRAMUSCULAR | Status: DC | PRN
Start: 2020-07-07 — End: 2020-07-07
  Administered 2020-07-07: 25 ug via INTRAVENOUS

## 2020-07-07 MED ORDER — SODIUM CHLORIDE 0.9 % WEIGHT BASED INFUSION
3.0000 mL/kg/h | INTRAVENOUS | Status: AC
  Administered 2020-07-07: 3 mL/kg/h via INTRAVENOUS

## 2020-07-07 MED ORDER — MIDAZOLAM HCL 2 MG/2ML IJ SOLN
INTRAMUSCULAR | Status: DC | PRN
  Administered 2020-07-07: 2 mg via INTRAVENOUS

## 2020-07-07 MED ORDER — HEPARIN (PORCINE) IN NACL 1000-0.9 UT/500ML-% IV SOLN
INTRAVENOUS | Status: AC
  Filled 2020-07-07: qty 1000

## 2020-07-07 MED ORDER — ACETAMINOPHEN 325 MG PO TABS: 650.0000 mg | ORAL_TABLET | ORAL | Status: DC | PRN

## 2020-07-07 MED ORDER — HYDRALAZINE HCL 20 MG/ML IJ SOLN: 10.0000 mg | INTRAMUSCULAR | Status: DC | PRN

## 2020-07-07 SURGICAL SUPPLY — 11 items

## 2020-07-07 NOTE — Interval H&P Note (Signed)
History and Physical Interval Note:  07/07/2020 10:29 AM  Rebecca Farley  has presented today for surgery, with the diagnosis of aotric stenosis.  The various methods of treatment have been discussed with the patient and family. After consideration of risks, benefits and other options for treatment, the patient has consented to  Procedure(s): RIGHT/LEFT HEART CATH AND CORONARY ANGIOGRAPHY (N/A) as a surgical intervention.  The patient's history has been reviewed, patient examined, no change in status, stable for surgery.  I have reviewed the patient's chart and labs.  Questions were answered to the patient's satisfaction.     Tonny Bollman

## 2020-07-07 NOTE — Discharge Instructions (Signed)
Drink plenty of fluids for 48 hours and keep wrist elevated at heart level for 24 hours  Radial Site Care   This sheet gives you information about how to care for yourself after your procedure. Your health care provider may also give you more specific instructions. If you have problems or questions, contact your health care provider. What can I expect after the procedure? After the procedure, it is common to have:  Bruising and tenderness at the catheter insertion area. Follow these instructions at home: Medicines  Take over-the-counter and prescription medicines only as told by your health care provider. Insertion site care 1. Follow instructions from your health care provider about how to take care of your insertion site. Make sure you: ? Wash your hands with soap and water before you change your bandage (dressing). If soap and water are not available, use hand sanitizer. ? Remove your dressing as told by your health care provider. In 24 hours 2. Check your insertion site every day for signs of infection. Check for: ? Redness, swelling, or pain. ? Fluid or blood. ? Pus or a bad smell. ? Warmth. 3. Do not take baths, swim, or use a hot tub until your health care provider approves. 4. You may shower 24-48 hours after the procedure, or as directed by your health care provider. ? Remove the dressing and gently wash the site with plain soap and water. ? Pat the area dry with a clean towel. ? Do not rub the site. That could cause bleeding. 5. Do not apply powder or lotion to the site. Activity   1. For 24 hours after the procedure, or as directed by your health care provider: ? Do not flex or bend the affected arm. ? Do not push or pull heavy objects with the affected arm. ? Do not drive yourself home from the hospital or clinic. You may drive 24 hours after the procedure unless your health care provider tells you not to. ? Do not operate machinery or power tools. 2. Do not lift  anything that is heavier than 10 lb (4.5 kg), or the limit that you are told, until your health care provider says that it is safe.  For 4 days 3. Ask your health care provider when it is okay to: ? Return to work or school. ? Resume usual physical activities or sports. ? Resume sexual activity. General instructions  If the catheter site starts to bleed, raise your arm and put firm pressure on the site. If the bleeding does not stop, get help right away. This is a medical emergency.  If you went home on the same day as your procedure, a responsible adult should be with you for the first 24 hours after you arrive home.  Keep all follow-up visits as told by your health care provider. This is important. Contact a health care provider if:  You have a fever.  You have redness, swelling, or yellow drainage around your insertion site. Get help right away if:  You have unusual pain at the radial site.  The catheter insertion area swells very fast.  The insertion area is bleeding, and the bleeding does not stop when you hold steady pressure on the area.  Your arm or hand becomes pale, cool, tingly, or numb. These symptoms may represent a serious problem that is an emergency. Do not wait to see if the symptoms will go away. Get medical help right away. Call your local emergency services (911 in the [place] Do   not drive yourself to the hospital. Summary  After the procedure, it is common to have bruising and tenderness at the site.  Follow instructions from your health care provider about how to take care of your radial site wound. Check the wound every day for signs of infection.  Do not lift anything that is heavier than 10 lb (4.5 kg), or the limit that you are told, until your health care provider says that it is safe. This information is not intended to replace advice given to you by your health care provider. Make sure you discuss any questions you have with your health care  provider. Document Revised: 10/29/2017 Document Reviewed: 10/29/2017 [employer] Patient Education  2020 [employer]  

## 2020-07-07 NOTE — Interval H&P Note (Signed)
History and Physical Interval Note:  07/07/2020 10:05 AM  Rebecca Farley  has presented today for surgery, with the diagnosis of aotric stenosis.  The various methods of treatment have been discussed with the patient and family. After consideration of risks, benefits and other options for treatment, the patient has consented to  Procedure(s): RIGHT/LEFT HEART CATH AND CORONARY ANGIOGRAPHY (N/A) as a surgical intervention.  The patient's history has been reviewed, patient examined, no change in status, stable for surgery.  I have reviewed the patient's chart and labs.  Questions were answered to the patient's satisfaction.     Tonny Bollman

## 2020-07-12 ENCOUNTER — Encounter: Payer: Self-pay | Admitting: Surgery

## 2020-07-12 ENCOUNTER — Ambulatory Visit (HOSPITAL_COMMUNITY)
Admission: RE | Admit: 2020-07-12 | Discharge: 2020-07-12 | Disposition: A | Discharge status: Home / Self Care | Payer: Medicare Other | Source: Ambulatory Visit | Attending: Cardiovascular Disease | Admitting: Cardiovascular Disease

## 2020-07-12 ENCOUNTER — Ambulatory Visit (HOSPITAL_COMMUNITY): Admission: RE | Admit: 2020-07-12 | Payer: Medicare Other | Source: Ambulatory Visit

## 2020-07-12 ENCOUNTER — Institutional Professional Consult (permissible substitution) (INDEPENDENT_AMBULATORY_CARE_PROVIDER_SITE_OTHER): Payer: Medicare Other | Admitting: Surgery

## 2020-07-12 ENCOUNTER — Other Ambulatory Visit: Payer: Self-pay

## 2020-07-12 VITALS — BP 131/80 | HR 72 | Temp 97.5°F | Resp 18 | Ht 65.0 in | Wt 192.6 lb

## 2020-07-12 DIAGNOSIS — I350 Nonrheumatic aortic (valve) stenosis: Secondary | ICD-10-CM | POA: Insufficient documentation

## 2020-07-12 MED ORDER — IOHEXOL 350 MG/ML SOLN
100.0000 mL | Freq: Once | INTRAVENOUS | Status: AC | PRN
  Administered 2020-07-12: 100 mL via INTRAVENOUS

## 2020-07-12 NOTE — Progress Notes (Signed)
Patient ID: [name], female   DOB: 1956-06-24, 64 y.o.   MRN: [id number]  [place] SURGERY CONSULTATION REPORT  Referring Provider is Rebecca Booze, MD Primary Cardiologist is Rebecca Grooms, MD PCP is Rebecca Orn, MD  Chief Complaint  Patient presents with  . New Patient (Initial Visit)    consultation for TAVR vs SAVR    HPI:  The patient is a 64 year old woman with history of hypertension, hyperlipidemia, atrial flutter status post RF ablation in the past in [place], family history of aortic valve disease, and known heart murmur for over 10 years who presents with a 69-monthhistory of progressive exertional shortness of breath and more recently fatigue.  She has had episodes of dizziness and near syncope.  She has had some cough associated chest discomfort but not exertional chest pain.  She says that recently she is extremely fatigued by the time she gets up in the morning and gets ready for work.  She works for [employer] [place] [employer]  She has no peripheral edema.  She denies PND and orthopnea.  She had an echocardiogram in July 2020 which showed a mean gradient of 38 mmHg across the aortic valve with a peak gradient of 70 mmHg.  Aortic valve area was 0.89 cm.  Left ventricular systolic function was normal.  Follow-up echocardiogram on 06/08/2020 showed progression to critical aortic stenosis with a mean gradient of 62 mmHg and a peak gradient of 105 mmHg.  Aortic valve area was 0.8 cm.  Left ventricular ejection fraction is 60 to 65%.  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  Right heart pressures were normal.  The patient is here today with her daughter.  She has been widowed since 1989 and has 1 daughter who [place]  [place] years.  Past Medical History:  Diagnosis Date  . [place]   [place]IO[place]REQUENCY [place]BL[place]  . Barrett esophagus   . Bee sting allergy   . Diverticulitis   . DJD (degenerative joint disease) of knee    KNEES, BACK (LUMBAR FACET DISEASE) AND HIPS, DALLDORF.  .Marland KitchenGERD (gastroesophageal reflux disease)   . Hiatal hernia   . History of syncope    RECURRENT  . Hyperlipidemia    BORDERLINE  . Hypertension   . Migraines   . Obesity   . Osteopenia   . S/P RF ablation operation for arrhythmia   . Severe aortic stenosis   . Shoulder tendinitis, left 2008    Past Surgical History:  Procedure Laterality Date  . ABDOMINAL HYSTERECTOMY    . BREAST BIOPSY Left    10 yrs +  . CESAREAN SECTION     1979, 1984  . CHOLECYSTECTOMY    . COLONOSCOPY  11/03/2012   Procedure: COLONOSCOPY;  Surgeon: MGarlan Fair MD;  Location: [facility];  Service: Endoscopy;  Laterality: N/A;  . ESOPHAGOGASTRODUODENOSCOPY  11/03/2012   Procedure: ESOPHAGOGASTRODUODENOSCOPY (EGD);  Surgeon: MGarlan Fair MD;  [facility];  Service: Endoscopy;  Laterality: N/A;  . implanted loop device    . RIGHT HEART CATH AND CORONARY ANGIOGRAPHY N/A 07/07/2020   Procedure: RIGHT HEART CATH AND CORONARY ANGIOGRAPHY;  Surgeon: CSherren Mocha MD;  [facility];  Service: Cardiovascular;  Laterality: N/A;  . TOTAL HIP ARTHROPLASTY Right 11/23/2019   Procedure: RIGHT TOTAL HIP ARTHROPLASTY ANTERIOR APPROACH;  Surgeon: Melrose Nakayama, MD;  Location: [facility];  Service: Orthopedics;  Laterality: Right;    Family History  Problem Relation Age of Onset  . Hypertension Mother   . Heart failure Mother   . Aneurysm Mother   . Kidney failure Mother   . Emphysema Father   . Prostate cancer Father   . Lung cancer Father   . Colon cancer Sister 70       dx 3 times; Lynch syndrome (PMS2)  . Melanoma Sister 66  . Other Sister        genetic  testing - PMS2+, BRIP1+, MITF+  . Melanoma Sister   . Other Sister        genetic testing - PMS2+  . Idiopathic pulmonary fibrosis Brother   . Lung cancer Maternal Uncle   . Liver cancer Maternal Uncle   . Lung cancer Paternal Uncle   . Ovarian cancer Paternal Grandmother        died early 40s  . Other Sister        genetic testing - BRIP1+, PMS2-, MITF-  . Other Sister        genetic testing - BRIP1+, MITF+, PMS2-  . Colon cancer Brother 24    Social History   Socioeconomic History  . Marital status: Widowed    Spouse name: Not on file  . Number of children: Not on file  . Years of education: Not on file  . Highest education level: Not on file  Occupational History  . Not on file  Tobacco Use  . Smoking status: Never Smoker  . Smokeless tobacco: Never Used  Vaping Use  . Vaping Use: Never used  Substance and Sexual Activity  . Alcohol use: Yes    Comment: OCCASIONALY  . Drug use: No  . Sexual activity: Not on file  Other Topics Concern  . Not on file  Social History Narrative  . Not on file   Social Determinants of Health   Financial Resource Strain:   . Difficulty of Paying Living Expenses: Not on file  Food Insecurity:   . Worried About Charity fundraiser in the Last Year: Not on file  . Ran Out of Food in the Last Year: Not on file  Transportation Needs:   . Lack of Transportation (Medical): Not on file  . Lack of Transportation (Non-Medical): Not on file  Physical Activity:   . Days of Exercise per Week: Not on file  . Minutes of Exercise per Session: Not on file  Stress:   . Feeling of Stress : Not on file  Social Connections:   . Frequency of Communication with Friends and Family: Not on file  . Frequency of Social Gatherings with Friends and Family: Not on file  . Attends Religious Services: Not on file  . Active Member of Clubs or Organizations: Not on file  . Attends Archivist Meetings: Not on file  . Marital Status: Not on file   Intimate Partner Violence:   . Fear of Current or Ex-Partner: Not on file  . Emotionally Abused: Not on file  . Physically Abused: Not on file  . Sexually Abused: Not on file    Current Outpatient Medications  Medication Sig Dispense Refill  . acetaminophen (TYLENOL) 500 MG tablet Take 500-1,000 mg by mouth every 6 (six) hours as needed (  for pain.).    Marland Kitchen Ascorbic Acid (VITAMIN C WITH ROSE HIPS) 500 MG tablet Take 500 mg by mouth daily.    Marland Kitchen b complex vitamins tablet Take 1 tablet by mouth daily.    . B Complex-C (B-COMPLEX WITH VITAMIN C) tablet Take 1 tablet by mouth daily.    . Calcium Carb-Cholecalciferol (CALCIUM+D3 PO) Take 1 tablet by mouth daily.    Marland Kitchen EPINEPHrine (EPIPEN 2-PAK) 0.3 mg/0.3 mL IJ SOAJ injection Inject 0.3 mg into the muscle as needed for anaphylaxis.    . folic acid (FOLVITE) 1 MG tablet Take 1 mg by mouth daily.    . Galcanezumab-gnlm (EMGALITY) 120 MG/ML SOAJ Inject 120 mg into the skin every 30 (thirty) days. 1.12 mL 11  . Ginkgo Biloba 120 MG TABS Take 120 mg by mouth daily.    . Glucosamine-Chondroitin (COSAMIN DS PO) Take 1-2 tablets by mouth See admin instructions. Take 2 tablets by mouth in the morning & take 1 tablet by mouth at night.    . metoprolol tartrate (LOPRESSOR) 50 MG tablet Take one tablet by mouth as directed prior to 10/6 CT scan 1 tablet 0  . Multiple Vitamin (MULTIVITAMIN WITH MINERALS) TABS tablet Take 1 tablet by mouth daily.    . Multiple Vitamins-Minerals (EMERGEN-C IMMUNE PLUS) PACK Take 1 packet by mouth daily.    . Omega-3 1000 MG CAPS Take 2,000 mg by mouth daily.    Marland Kitchen omeprazole (PRILOSEC) 20 MG capsule Take 20 mg by mouth daily before breakfast.     . Polyethyl Glycol-Propyl Glycol (LUBRICANT EYE DROPS) 0.4-0.3 % SOLN Place 1 drop into both eyes 3 (three) times daily as needed (dry/irritated eyes.).    Marland Kitchen Rimegepant Sulfate (NURTEC) 75 MG TBDP Take 75 mg by mouth daily as needed. For migraines. Take as close to onset of migraine as  possible. One daily maximum. (Patient taking differently: Take 75 mg by mouth daily as needed (migraines). For migraines. Take as close to onset of migraine as possible. One daily maximum.) 8 tablet 6  . Turmeric 500 MG TABS Take 1,000 mg by mouth daily.    . valsartan-hydrochlorothiazide (DIOVAN-HCT) 160-12.5 MG tablet Take 1 tablet by mouth daily.      No current facility-administered medications for this visit.    Allergies  Allergen Reactions  . Penicillins Anaphylaxis, Hives and Other (See Comments)    Has patient had a PCN reaction causing immediate rash, facial/tongue/throat swelling, SOB or lightheadedness with hypotension: No Has patient had a PCN reaction causing severe rash involving mucus membranes or skin necrosis:  No Has patient had a PCN reaction that required hospitalization No Has patient had a PCN reaction occurring within the last 10 years: No If all of the above answers are "NO", then may proceed with Cephalosporin use.  . Codeine     STOMACH SICKNESS  . Mobic [Meloxicam]     DIFFICULT BREATHING  . Nsaids Itching and Nausea And Vomiting      Review of Systems:   General:  + decreased appetite, + decreased energy, no weight gain, no weight loss, no fever  Cardiac:  no chest pain with exertion, no chest pain at rest, +SOB with mild exertion, no resting SOB, no PND, no orthopnea, no palpitations, no arrhythmia, no atrial fibrillation, no LE edema, + dizzy spells, no syncope  Respiratory:  + exertional shortness of breath, no home oxygen, no productive cough, + dry cough, no bronchitis, no wheezing, no hemoptysis, no asthma, no pain with inspiration  or cough, no sleep apnea, no CPAP at night  GI:   no difficulty swallowing, + reflux, + frequent heartburn, + hiatal hernia, no abdominal pain, no constipation, no diarrhea, no hematochezia, no hematemesis, no melena  GU:   no dysuria,  no frequency, no urinary tract infection, no hematuria,  no kidney stones, no kidney  disease  Vascular:  no pain suggestive of claudication, no pain in feet, no leg cramps, no varicose veins, no DVT, no non-healing foot ulcer  Neuro:   no stroke, no TIA's, no seizures, no headaches, no temporary blindness one eye,  no slurred speech, no peripheral neuropathy, no chronic pain, no instability of gait, no memory/cognitive dysfunction  Musculoskeletal: + arthritis, no joint swelling, no myalgias, no difficulty walking, normal mobility   Skin:   no rash, + itching, no skin infections, no pressure sores or ulcerations  Psych:   no anxiety, no depression, no nervousness, no unusual recent stress  Eyes:   no blurry vision, + floaters, no recent vision changes, + wears glasses or contacts  ENT:   no hearing loss, no loose or painful teeth, + dentures, last saw dentist 06/2020  Hematologic:  + easy bruising, no abnormal bleeding, no clotting disorder, no frequent epistaxis  Endocrine:  no diabetes, does not check CBG's at home       Physical Exam:   BP 131/80 (BP Location: Right Arm, Patient Position: Sitting)   Pulse 72   Temp (!) 97.5 F (36.4 C)   Resp 18   Ht _0  (1.651 m)   Wt 192 lb 9.6 oz (87.4 kg)   SpO2 94% Comment: RA with mask on  BMI 32.05 kg/m   General:  well-appearing  HEENT:  Unremarkable, NCAT, PERLA, EOMI  Neck:   no JVD, no bruits, no adenopathy   Chest:   clear to auscultation, symmetrical breath sounds, no wheezes, no rhonchi   CV:   RRR, grade lll/VI crescendo/decrescendo murmur heard best at RSB,  no diastolic murmur  Abdomen:  soft, non-tender, no masses   Extremities:  warm, well-perfused, pulses palpable at ankles, no LE edema  Rectal/GU  Deferred  Neuro:   Grossly non-focal and symmetrical throughout  Skin:   Clean and dry, no rashes, no breakdown   Diagnostic Tests:   ECHOCARDIOGRAM REPORT       Patient Name:  Rebecca Farley Date of Exam: 06/08/2020  Medical Rec #: [id number]   Height:    65.0 in  Accession #:  [id number]   Weight:    192.4 lb  Date of Birth: 1956-09-08   BSA:     1.946 m  Patient Age:  89 years   BP:      116/86 mmHg  Patient Gender: F       HR:      81 bpm.  Exam Location: [address]   Procedure: 2D Echo, 3D Echo, Cardiac Doppler, Color Doppler and Strain  Analysis   Indications:  I35 Aortic stenosis    History:    Patient has prior history of Echocardiogram examinations,  most         recent 04/14/2019. Aortic Valve Disease, Arrythmias:Atrial         Flutter, Signs/Symptoms:Murmur; Risk Factors:Hypertension,         Dyslipidemia and Obesity.    Sonographer:  Jessee Avers, RDCS  Referring Phys: [place]    1. Since the last study on 04/14/2019 there has been a significant  change -  aortic stenosis is now critical with mean gradient 62 mmHg.  2. Left ventricular ejection fraction, by estimation, is 60 to 65%. The  left ventricle has normal function. The left ventricle has no regional  wall motion abnormalities. There is moderate concentric left ventricular  hypertrophy. Left ventricular  diastolic parameters are consistent with Grade I diastolic dysfunction  (impaired relaxation). The average left ventricular global longitudinal  strain is -22.0 %. The global longitudinal strain is normal.  3. Right ventricular systolic function is normal. The right ventricular  size is normal.  4. The mitral valve is normal in structure. Mild mitral valve  regurgitation. No evidence of mitral stenosis.  5. The aortic valve is normal in structure. Aortic valve regurgitation is  mild. Critical aortic stenosis. Aortic valve mean gradient measures 62.0  mmHg.  6. The inferior vena cava is normal in size with greater than 50%  respiratory variability, suggesting right atrial pressure of 3 mmHg.   Comparison(s): 04/14/19 EF >65%. Severe AS.   FINDINGS  Left Ventricle: Left ventricular  ejection fraction, by estimation, is 60  to 65%. The left ventricle has normal function. The left ventricle has no  regional wall motion abnormalities. The average left ventricular global  longitudinal strain is -22.0 %.  The global longitudinal strain is normal. The left ventricular internal  cavity size was normal in size. There is moderate concentric left  ventricular hypertrophy. Left ventricular diastolic parameters are  consistent with Grade I diastolic dysfunction  (impaired relaxation). Normal left ventricular filling pressure.   Right Ventricle: The right ventricular size is normal. No increase in  right ventricular wall thickness. Right ventricular systolic function is  normal.   Left Atrium: Left atrial size was normal in size.   Right Atrium: Right atrial size was normal in size.   Pericardium: There is no evidence of pericardial effusion.   Mitral Valve: The mitral valve is normal in structure. There is moderate  thickening of the mitral valve leaflet(s). There is moderate calcification  of the mitral valve leaflet(s). Normal mobility of the mitral valve  leaflets. Mild mitral valve  regurgitation. No evidence of mitral valve stenosis.   Tricuspid Valve: The tricuspid valve is normal in structure. Tricuspid  valve regurgitation is mild . No evidence of tricuspid stenosis.   Aortic Valve: The aortic valve is normal in structure.. There is severe  thickening and severe calcifcation of the aortic valve. Aortic valve  regurgitation is mild. Critical aortic stenosis. There is severe  thickening of the aortic valve. There is severe  calcifcation of the aortic valve. Aortic valve mean gradient measures 62.0  mmHg. Aortic valve peak gradient measures 105.3 mmHg. Aortic valve area,  by VTI measures 0.80 cm.   Pulmonic Valve: The pulmonic valve was normal in structure. Pulmonic valve  regurgitation is not visualized. No evidence of pulmonic stenosis.   Aorta: The aortic  root is normal in size and structure.   Venous: The inferior vena cava is normal in size with greater than 50%  respiratory variability, suggesting right atrial pressure of 3 mmHg.   IAS/Shunts: No atrial level shunt detected by color flow Doppler.     LEFT VENTRICLE  PLAX 2D  LVIDd:     3.10 cm Diastology  LVIDs:     2.10 cm LV e' lateral:  3.26 cm/s  LV PW:     1.30 cm LV E/e' lateral: 26.7  LV IVS:    1.50 cm LV e' medial:  4.68  cm/s  LVOT diam:   2.00 cm LV E/e' medial: 18.6  LV SV:     94  LV SV Index:  48    2D Longitudinal Strain  LVOT Area:   3.14 cm 2D Strain GLS (A2C):  -24.1 %             2D Strain GLS (A3C):  -20.0 %             2D Strain GLS (A4C):  -21.8 %             2D Strain GLS Avg:   -22.0 %               3D Volume EF:             3D EF:    59 %             LV EDV:    115 ml             LV ESV:    47 ml             LV SV:    68 ml   RIGHT VENTRICLE  RV Basal diam: 3.50 cm  RV S prime:   12.20 cm/s  TAPSE (M-mode): 1.6 cm   LEFT ATRIUM       Index    RIGHT ATRIUM      Index  LA diam:    3.40 cm 1.75 cm/m RA Pressure: 3.00 mmHg  LA Vol (A2C):  21.7 ml 11.15 ml/m RA Area:   9.66 cm  LA Vol (A4C):  17.8 ml 9.15 ml/m RA Volume:  21.10 ml 10.84 ml/m  LA Biplane Vol: 20.4 ml 10.48 ml/m  AORTIC VALVE  AV Area (Vmax):  0.75 cm  AV Area (Vmean):  0.73 cm  AV Area (VTI):   0.80 cm  AV Vmax:      513.00 cm/s  AV Vmean:     341.200 cm/s  AV VTI:      1.170 m  AV Peak Grad:   105.3 mmHg  AV Mean Grad:   62.0 mmHg  LVOT Vmax:     123.00 cm/s  LVOT Vmean:    79.000 cm/s  LVOT VTI:     0.299 m  LVOT/AV VTI ratio: 0.26    AORTA  Ao Root diam: 3.50 cm  Ao Asc diam: 3.70 cm   MITRAL VALVE        TRICUSPID VALVE                Estimated RAP: 3.00 mmHg    MV E velocity: 86.90 cm/s  SHUNTS  MV A velocity: 138.00 cm/s Systemic VTI: 0.30 m  MV E/A ratio: 0.63     Systemic Diam: 2.00 cm   Ena Dawley MD  Electronically signed by Ena Dawley MD  Signature Date/Time: 06/08/2020/4:35:48 PM      Final    Physicians  Panel Physicians Referring Physician Case Authorizing Physician  Sherren Mocha, MD (Primary)    Procedures  RIGHT HEART CATH AND CORONARY ANGIOGRAPHY  Conclusion  1. Known severe aortic stenosis by noninvasive assessment. Heavily calcified, restricted aortic valve by plain fluoroscopy 2. Patent coronary arteries with very mild nonobstructive irregularity in the RCA and LAD, no significant stenosis 3. Normal right heart pressures  Plan: continue multidisciplinary heart team evaluation for treatment of severe, stage D1 aortic stenosis Indications  Severe aortic stenosis [I35.0 (ICD-10-CM)]  Procedural Details  Technical Details INDICATION: Severe symptomatic  aortic stenosis  PROCEDURAL DETAILS: There was an indwelling IV in a right antecubital vein. Using normal sterile technique, the IV was changed out for a 5 Fr brachial sheath over a 0.018 inch wire. The right wrist was then prepped, draped, and anesthetized with 1% lidocaine. Using the modified Seldinger technique a 5/6 French Slender sheath was placed in the right radial artery. Intra-arterial verapamil was administered through the radial artery sheath. IV heparin was administered after a JR4 catheter was advanced into the central aorta. A Swan-Ganz catheter was used for the right heart catheterization. Standard protocol was followed for recording of right heart pressures and sampling of oxygen saturations. Fick cardiac output was calculated. Standard Judkins catheters were used for selective coronary angiography. The aortic valve is not crossed. There were no immediate procedural complications.  The patient was transferred to the post catheterization recovery area for further monitoring.      Estimated blood loss <50 mL.   During this procedure medications were administered to achieve and maintain moderate conscious sedation while the patient's heart rate, blood pressure, and oxygen saturation were continuously monitored and I was present face-to-face 100% of this time.  Medications (Filter: Administrations occurring from 1015 to 1103 on 07/07/20) (important) Continuous medications are totaled by the amount administered until 07/07/20 1103.  fentaNYL (SUBLIMAZE) injection (mcg) Total dose:  25 mcg Date/Time  Rate/Dose/Volume Action  07/07/20 1027  25 mcg Given    midazolam (VERSED) injection (mg) Total dose:  2 mg Date/Time  Rate/Dose/Volume Action  07/07/20 1027  2 mg Given    Heparin (Porcine) in NaCl 1000-0.9 UT/500ML-% SOLN (mL) Total volume:  1,000 mL Date/Time  Rate/Dose/Volume Action  07/07/20 1027  500 mL Given  1028  500 mL Given    lidocaine (PF) (XYLOCAINE) 1 % injection (mL) Total volume:  4 mL Date/Time  Rate/Dose/Volume Action  07/07/20 1035  2 mL Given  1039  2 mL Given    Radial Cocktail/Verapamil only (mL) Total volume:  10 mL Date/Time  Rate/Dose/Volume Action  07/07/20 1039  10 mL Given    heparin sodium (porcine) injection (Units) Total dose:  4,000 Units Date/Time  Rate/Dose/Volume Action  07/07/20 1047  4,000 Units Given    iohexol (OMNIPAQUE) 350 MG/ML injection (mL) Total volume:  40 mL Date/Time  Rate/Dose/Volume Action  07/07/20 1057  40 mL Given    Sedation Time  Sedation Time Physician-1: 25 minutes 34 seconds  Contrast  Medication Name Total Dose  iohexol (OMNIPAQUE) 350 MG/ML injection 40 mL    Radiation/Fluoro  Fluoro time: 2.9 (min) DAP: 8070 (mGycm2) Cumulative Air Kerma: 146 (mGy)  Coronary Findings  Diagnostic Dominance: Right Left Anterior Descending  The vessel exhibits minimal luminal irregularities.   Right Coronary Artery  The vessel exhibits minimal luminal irregularities.  Intervention  No interventions have been documented. Coronary Diagrams  Diagnostic Dominance: Right  Intervention  Implants   No implant documentation for this case.  Syngo Images  Show images for CARDIAC CATHETERIZATION Images on Long Term Storage  Show images for At[place], [place] to Procedure Log  Procedure Log    Hemo Data   Most Recent Value  Fick Cardiac Output 8.27 L/min  Fick Cardiac Output Index 4.28 (L/min)/BSA  RA A Wave 7 mmHg  RA V Wave 4 mmHg  RA Mean 3 mmHg  RV Systolic Pressure 27 mmHg  RV Diastolic Pressure 0 mmHg  RV EDP 3 mmHg  PA Systolic Pressure 25 mmHg  PA Diastolic Pressure 11 mmHg  PA Mean 18 mmHg  PW A Wave 15 mmHg  PW V Wave 15 mmHg  PW Mean 11 mmHg  AO Systolic Pressure 932 mmHg  AO Diastolic Pressure 72 mmHg  AO Mean 92 mmHg  QP/QS 1.2  TPVR Index 4.21 HRUI  TSVR Index 21.51 HRUI  PVR SVR Ratio 0.08  TPVR/TSVR Ratio 0.2    ADDENDUM REPORT: 07/12/2020 11:26  CLINICAL DATA:  Aortic stenosis  EXAM: Cardiac TAVR CT  TECHNIQUE: The patient was scanned on a Siemens Force 671 slice scanner. A 120 kV retrospective scan was triggered in the descending thoracic aorta at 111 HU's. Gantry rotation speed was 270 msecs and collimation was .9 mm. No beta blockade or nitro were given. The 3D data set was reconstructed in 5% intervals of the R-R cycle. Systolic and diastolic phases were analyzed on a dedicated work station using MPR, MIP and VRT modes. The patient received 80 cc of contrast.  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  Aorta: Moderate calcific atherosclerosis with normal arch vessels and no aneurysm  Sinotubular Junction: 24 mm  Ascending Thoracic Aorta: 34 mm  Aortic Arch: 29 mm  Descending Thoracic Aorta:  Sinus of Valsalva Measurements:  Non-coronary: 30.5 mm  Right -  coronary: 26 mm  Left - coronary: 28 mm  Coronary Artery Height above Annulus:  Left Main: 11.5 mm above annulus  Right Coronary: 11.5 mm above annulus  Virtual Basal Annulus Measurements:  Maximum/Minimum Diameter: 25 mm x 20.8 mm  Perimeter: 72 mm  Area: 425 mm2  Coronary Arteries: Sufficient height above annulus for deployment  Optimum Fluoroscopic Angle for Delivery: LAO 14 Caudal 14 degrees  IMPRESSION: 1. Functionally bicuspid AV with annular area of 425 mm2 suitable for a 23 mm Sapien 3 valve  2.   Normal aortic root 3.4 cm  3.  Coronaries sufficient height above annulus for deployment  4. Optimum angiographic angle for deployment LAO 14 Caudal 14 degrees  Jenkins Rouge   Electronically Signed   By: Jenkins Rouge M.D.   On: 07/12/2020 11:26   STS Risk Calculator: Isolated AVR: Risk of Mortality: 1.123% Renal Failure: 1.253% Permanent Stroke: 1.200% Prolonged Ventilation: 3.900% DSW Infection: 0.077% Reoperation: 2.579% Morbidity or Mortality: 6.861% Short Length of Stay: 46.344% Long Length of Stay: 3.039%  Impression:  This 64 year old woman has stage D, critical, symptomatic aortic stenosis with New York Heart Association class III symptoms of exertional shortness of breath and fatigue consistent with chronic diastolic congestive heart failure.  I have personally reviewed her 2D echocardiogram, cardiac catheterization, and CTA studies.  Her echocardiogram shows a severely calcified aortic valve that looks functionally bicuspid with fusion of the left and right cusps and restricted leaflet mobility.  The mean gradient is 62 mmHg with a valve area of 0.8 cm consistent with critical aortic stenosis.  Cardiac catheterization shows nonobstructive coronary disease.  Her gated cardiac CTA shows a functionally bicuspid aortic valve with fusion of the left and right cusps.  Given her age of 10, bicuspid aortic valve disease, and low  surgical risk I think the best option for her is open surgical aortic valve replacement.  This will significantly decrease the risk of structural valve deterioration and paravalvular leak.  I discussed the case with Dr. Burt Knack with the multidisciplinary heart valve team and we are both in agreement with proceeding with open surgical aortic valve replacement.  The patient and her daughter were counseled at length regarding treatment alternatives for management  of severe symptomatic aortic stenosis. Alternative approaches such as conventional aortic valve replacement, transcatheter aortic valve replacement, and palliative medical therapy were compared and contrasted at length. The risks associated with conventional surgical aortic valve replacement were been discussed in detail, as were expectations for post-operative convalescence. Long-term prognosis with medical therapy was discussed.  She is in agreement with proceeding with open surgical aortic valve replacement.  I discussed the operative procedure with the patient and her daughter including benefits and risks; including but not limited to bleeding, blood transfusion, infection, stroke, myocardial infarction, heart block requiring a permanent pacemaker, organ dysfunction, and death.  I also discussed the longer-term risks of structural valve deterioration and prosthetic valve endocarditis. Anner Crete understands and agrees to proceed.    Plan:  She will be scheduled for open surgical aortic valve replacement using a bioprosthetic valve in the next 1 to 2 weeks.  I spent 60 minutes performing this consultation and > 50% of this time was spent face to face counseling and coordinating the care of this patient's symptomatic critical aortic stenosis.      Gaye Pollack, MD 07/12/2020 1:44 PM

## 2020-07-13 ENCOUNTER — Other Ambulatory Visit: Payer: Self-pay | Admitting: *Deleted

## 2020-07-13 ENCOUNTER — Encounter: Payer: Self-pay | Admitting: *Deleted

## 2020-07-13 DIAGNOSIS — I350 Nonrheumatic aortic (valve) stenosis: Secondary | ICD-10-CM

## 2020-07-19 ENCOUNTER — Telehealth: Payer: Self-pay

## 2020-07-19 NOTE — Telephone Encounter (Signed)
FMLA form completed and faxed to [employer] @ [phone],  Beginning leave 07/27/20, RTW approx 10/23/2020. STD form completed and faxed to [facility] @ [phone] Beginning leave 07/27/20, RTW date 10/23/2020, Alver Fisher forms mailed to home address.

## 2020-07-20 ENCOUNTER — Other Ambulatory Visit: Payer: Medicare Other

## 2020-07-24 ENCOUNTER — Institutional Professional Consult (permissible substitution): Payer: BLUE CROSS/BLUE SHIELD | Admitting: Neurology

## 2020-07-24 NOTE — Progress Notes (Signed)
[facility] - [place], [place] - [address] [place] [address] [place] [address] Phone: [phone] Fax: [phone]      Your procedure is scheduled on October 21  Report to [facility] Main Entrance "A" at 0530 A.M., and check in at the Admitting office.  Call this number if you have problems the morning of surgery:  [phone]  Call [phone] if you have any questions prior to your surgery date Monday-Friday 8am-4pm    Remember:  Do not eat or drink after midnight the night before your surgery     Take these medicines the morning of surgery with A SIP OF WATER  acetaminophen (TYLENOL) if needed omeprazole (PRILOSEC) metoprolol tartrate (LOPRESSOR) if taking omeprazole (PRILOSEC)  Eye drops if needed   As of today, STOP taking any Aspirin (unless otherwise instructed by your surgeon) Aleve, Naproxen, Ibuprofen, Motrin, Advil, Goody's, BC's, all herbal medications, fish oil, and all vitamins.                      Do not wear jewelry, make up, or nail polish            Do not wear lotions, powders, perfumes/colognes, or deodorant.            Do not shave 48 hours prior to surgery.  Men may shave face and neck.            Do not bring valuables to the hospital.            [facility] is not responsible for any belongings or valuables.  Do NOT Smoke (Tobacco/Vaping) or drink Alcohol 24 hours prior to your procedure If you use a CPAP at night, you may bring all equipment for your overnight stay.   Contacts, glasses, dentures or bridgework may not be worn into surgery.      For patients admitted to the hospital, discharge time will be determined by your treatment team.   Patients discharged the day of surgery will not be allowed to drive home, and someone needs to stay with them for 24 hours.    Special instructions:   [place]- Preparing For Surgery  Before surgery, you can play an important role. Because skin is not sterile,  your skin needs to be as free of germs as possible. You can reduce the number of germs on your skin by washing with CHG (chlorahexidine gluconate) Soap before surgery.  CHG is an antiseptic cleaner which kills germs and bonds with the skin to continue killing germs even after washing.    Oral Hygiene is also important to reduce your risk of infection.  Remember - BRUSH YOUR TEETH THE MORNING OF SURGERY WITH YOUR REGULAR TOOTHPASTE  Please do not use if you have an allergy to CHG or antibacterial soaps. If your skin becomes reddened/irritated stop using the CHG.  Do not shave (including legs and underarms) for at least 48 hours prior to first CHG shower. It is OK to shave your face.  Please follow these instructions carefully.   1. Shower the NIGHT BEFORE SURGERY and the MORNING OF SURGERY with CHG Soap.   2. If you chose to wash your hair, wash your hair first as usual with your normal shampoo.  3. After you shampoo, rinse your hair and body thoroughly to remove the shampoo.  4. Use CHG as you would any other liquid soap. You can apply CHG directly to the skin and wash gently  with a scrungie or a clean washcloth.   5. Apply the CHG Soap to your body ONLY FROM THE NECK DOWN.  Do not use on open wounds or open sores. Avoid contact with your eyes, ears, mouth and genitals (private parts). Wash Face and genitals (private parts)  with your normal soap.   6. Wash thoroughly, paying special attention to the area where your surgery will be performed.  7. Thoroughly rinse your body with warm water from the neck down.  8. DO NOT shower/wash with your normal soap after using and rinsing off the CHG Soap.  9. Pat yourself dry with a CLEAN TOWEL.  10. Wear CLEAN PAJAMAS to bed the night before surgery  11. Place CLEAN SHEETS on your bed the night of your first shower and DO NOT SLEEP WITH PETS.   Day of Surgery: Wear Clean/Comfortable clothing the morning of surgery Do not apply any  deodorants/lotions.   Remember to brush your teeth WITH YOUR REGULAR TOOTHPASTE.   Please read over the following fact sheets that you were given.

## 2020-07-25 ENCOUNTER — Other Ambulatory Visit: Payer: Self-pay

## 2020-07-25 ENCOUNTER — Ambulatory Visit (HOSPITAL_BASED_OUTPATIENT_CLINIC_OR_DEPARTMENT_OTHER)
Admission: RE | Admit: 2020-07-25 | Discharge: 2020-07-25 | Disposition: A | Discharge status: Home / Self Care | Payer: Medicare Other | Source: Ambulatory Visit | Attending: Surgery | Admitting: Surgery

## 2020-07-25 ENCOUNTER — Other Ambulatory Visit (HOSPITAL_COMMUNITY)
Admission: RE | Admit: 2020-07-25 | Discharge: 2020-07-25 | Disposition: A | Discharge status: Home / Self Care | Payer: Medicare Other | Source: Ambulatory Visit | Attending: Surgery | Admitting: Surgery

## 2020-07-25 ENCOUNTER — Encounter (HOSPITAL_COMMUNITY)
Admission: RE | Admit: 2020-07-25 | Discharge: 2020-07-25 | Disposition: A | Discharge status: Home / Self Care | Payer: Medicare Other | Source: Ambulatory Visit | Attending: Surgery | Admitting: Surgery

## 2020-07-25 ENCOUNTER — Encounter (HOSPITAL_COMMUNITY): Payer: Self-pay

## 2020-07-25 DIAGNOSIS — Z20822 Contact with and (suspected) exposure to covid-19: Secondary | ICD-10-CM | POA: Insufficient documentation

## 2020-07-25 DIAGNOSIS — I350 Nonrheumatic aortic (valve) stenosis: Secondary | ICD-10-CM | POA: Insufficient documentation

## 2020-07-25 DIAGNOSIS — I10 Essential (primary) hypertension: Secondary | ICD-10-CM | POA: Insufficient documentation

## 2020-07-25 DIAGNOSIS — E785 Hyperlipidemia, unspecified: Secondary | ICD-10-CM | POA: Insufficient documentation

## 2020-07-25 DIAGNOSIS — Z01818 Encounter for other preprocedural examination: Secondary | ICD-10-CM | POA: Insufficient documentation

## 2020-07-25 LAB — COMPREHENSIVE METABOLIC PANEL
ALT: 16 U/L (ref 0–44)
AST: 22 U/L (ref 15–41)
Albumin: 3.7 g/dL (ref 3.5–5.0)
Alkaline Phosphatase: 91 U/L (ref 38–126)
Anion gap: 12 (ref 5–15)
BUN: 19 mg/dL (ref 8–23)
CO2: 24 mmol/L (ref 22–32)
Calcium: 10.3 mg/dL (ref 8.9–10.3)
Chloride: 101 mmol/L (ref 98–111)
Creatinine, Ser: 0.68 mg/dL (ref 0.44–1.00)
GFR, Estimated: >60 mL/min (ref >60)
Glucose, Bld: 102 mg/dL — ABNORMAL HIGH (ref 70–99)
Potassium: 3.6 mmol/L (ref 3.5–5.1)
Sodium: 137 mmol/L (ref 135–145)
Total Bilirubin: 0.6 mg/dL (ref 0.3–1.2)
Total Protein: 7.9 g/dL (ref 6.5–8.1)

## 2020-07-25 LAB — BLOOD GAS, ARTERIAL
Acid-Base Excess: 3.5 mmol/L — ABNORMAL HIGH (ref 0.0–2.0)
Bicarbonate: 26.6 mmol/L (ref 20.0–28.0)
Drawn by: 58793
FIO2: 21
O2 Saturation: 98.5 %
Patient temperature: 37
pCO2 arterial: 34 mmHg (ref 32.0–48.0)
pH, Arterial: 7.505 — ABNORMAL HIGH (ref 7.350–7.450)
pO2, Arterial: 107 mmHg (ref 83.0–108.0)

## 2020-07-25 LAB — CBC
HCT: 39.9 % (ref 36.0–46.0)
Hemoglobin: 12.9 g/dL (ref 12.0–15.0)
MCH: 29.6 pg (ref 26.0–34.0)
MCHC: 32.3 g/dL (ref 30.0–36.0)
MCV: 91.5 fL (ref 80.0–100.0)
Platelets: 238 10*3/uL (ref 150–400)
RBC: 4.36 MIL/uL (ref 3.87–5.11)
RDW: 13.3 % (ref 11.5–15.5)
WBC: 8 10*3/uL (ref 4.0–10.5)
nRBC: 0 % (ref 0.0–0.2)

## 2020-07-25 LAB — SURGICAL PCR SCREEN
MRSA, PCR: NEGATIVE
Staphylococcus aureus: POSITIVE — AB

## 2020-07-25 LAB — URINALYSIS, ROUTINE W REFLEX MICROSCOPIC
Bilirubin Urine: NEGATIVE
Glucose, UA: NEGATIVE mg/dL
Hgb urine dipstick: NEGATIVE
Ketones, ur: NEGATIVE mg/dL
Leukocytes,Ua: NEGATIVE
Nitrite: NEGATIVE
Protein, ur: NEGATIVE mg/dL
Specific Gravity, Urine: 1.015 (ref 1.005–1.030)
pH: 7 (ref 5.0–8.0)

## 2020-07-25 LAB — PROTIME-INR
INR: 1 (ref 0.8–1.2)
Prothrombin Time: 12.6 seconds (ref 11.4–15.2)

## 2020-07-25 LAB — SARS CORONAVIRUS 2 (TAT 6-24 HRS): SARS Coronavirus 2: NEGATIVE

## 2020-07-25 LAB — APTT: aPTT: 29 seconds (ref 24–36)

## 2020-07-25 NOTE — Progress Notes (Signed)
PCP:  Kirby Funk, MD Cardiologist:  Lance Muss, MD  EKG:  07/25/20 CXR:  07/25/20 ECHO:  06/08/20 Stress Test:  Denies Cardiac Cath:  07/07/20  Covid test 07/25/20  Anesthesia Review:  Yes, cardiac history and heart surgery.   Patient denies shortness of breath, fever, cough, and chest pain at PAT appointment.  Patient verbalized understanding of instructions provided today at the PAT appointment.  Patient asked to review instructions at home and day of surgery.

## 2020-07-25 NOTE — Progress Notes (Signed)
Pre-cabg has been completed.   Preliminary results in CV Proc.   Blanch Media 07/25/2020 10:44 AM

## 2020-07-26 LAB — HEMOGLOBIN A1C
Hgb A1c MFr Bld: 5.4 % (ref 4.8–5.6)
Mean Plasma Glucose: 108 mg/dL

## 2020-07-26 MED ORDER — POTASSIUM CHLORIDE 2 MEQ/ML IV SOLN
80.0000 meq | INTRAVENOUS | Status: DC
  Filled 2020-07-26: qty 40

## 2020-07-26 MED ORDER — DEXMEDETOMIDINE HCL IN NACL 400 MCG/100ML IV SOLN
0.1000 ug/kg/h | INTRAVENOUS | Status: AC
  Administered 2020-07-27: .4 ug/kg/h via INTRAVENOUS
  Filled 2020-07-26: qty 100

## 2020-07-26 MED ORDER — TRANEXAMIC ACID (OHS) PUMP PRIME SOLUTION
2.0000 mg/kg | INTRAVENOUS | Status: DC
  Filled 2020-07-26: qty 1.73

## 2020-07-26 MED ORDER — LEVOFLOXACIN IN D5W 500 MG/100ML IV SOLN
500.0000 mg | INTRAVENOUS | Status: AC
  Administered 2020-07-27: 500 mg via INTRAVENOUS
  Filled 2020-07-26: qty 100

## 2020-07-26 MED ORDER — SODIUM CHLORIDE 0.9 % IV SOLN
INTRAVENOUS | Status: DC
  Filled 2020-07-26: qty 30

## 2020-07-26 MED ORDER — NOREPINEPHRINE 4 MG/250ML-% IV SOLN
0.0000 ug/min | INTRAVENOUS | Status: DC
  Filled 2020-07-26: qty 250

## 2020-07-26 MED ORDER — MILRINONE LACTATE IN DEXTROSE 20-5 MG/100ML-% IV SOLN
0.3000 ug/kg/min | INTRAVENOUS | Status: DC
  Filled 2020-07-26: qty 100

## 2020-07-26 MED ORDER — VANCOMYCIN HCL 1500 MG/300ML IV SOLN
1500.0000 mg | INTRAVENOUS | Status: AC
  Administered 2020-07-27: 1500 mg via INTRAVENOUS
  Filled 2020-07-26: qty 300

## 2020-07-26 MED ORDER — TRANEXAMIC ACID (OHS) BOLUS VIA INFUSION
15.0000 mg/kg | INTRAVENOUS | Status: AC
  Administered 2020-07-27: 1296 mg via INTRAVENOUS
  Filled 2020-07-26: qty 1296

## 2020-07-26 MED ORDER — TRANEXAMIC ACID 1000 MG/10ML IV SOLN
1.5000 mg/kg/h | INTRAVENOUS | Status: AC
  Administered 2020-07-27: 1.5 mg/kg/h via INTRAVENOUS
  Filled 2020-07-26: qty 25

## 2020-07-26 MED ORDER — EPINEPHRINE HCL 5 MG/250ML IV SOLN IN NS
0.0000 ug/min | INTRAVENOUS | Status: DC
  Filled 2020-07-26: qty 250

## 2020-07-26 MED ORDER — INSULIN REGULAR(HUMAN) IN NACL 100-0.9 UT/100ML-% IV SOLN
INTRAVENOUS | Status: AC
  Administered 2020-07-27: 1 [IU]/h via INTRAVENOUS
  Filled 2020-07-26: qty 100

## 2020-07-26 MED ORDER — PHENYLEPHRINE HCL-NACL 20-0.9 MG/250ML-% IV SOLN
30.0000 ug/min | INTRAVENOUS | Status: AC
  Administered 2020-07-27: 25 ug/min via INTRAVENOUS
  Filled 2020-07-26: qty 250

## 2020-07-26 MED ORDER — PLASMA-LYTE 148 IV SOLN
INTRAVENOUS | Status: DC
  Filled 2020-07-26: qty 2.5

## 2020-07-26 MED ORDER — NITROGLYCERIN IN D5W 200-5 MCG/ML-% IV SOLN
2.0000 ug/min | INTRAVENOUS | Status: DC
  Filled 2020-07-26: qty 250

## 2020-07-26 MED ORDER — MAGNESIUM SULFATE 50 % IJ SOLN
40.0000 meq | INTRAMUSCULAR | Status: DC
  Filled 2020-07-26: qty 9.85

## 2020-07-26 NOTE — H&P (Signed)
[address]       [place], [id number]             [phone]      Cardiothoracic Surgery Admission History and Physical   Referring Provider is Jettie Booze, MD  Primary Cardiologist is Larae Grooms, MD  PCP is Lavone Orn, MD      Chief Complaint  Patient presents with Aortic Stenosis  .        HPI:  The patient is a 64 year old woman with history of hypertension, hyperlipidemia, atrial flutter status post RF ablation in the past in [place], family history of aortic valve disease, and known heart murmur for over 10 years who presents with a 48-monthhistory of progressive exertional shortness of breath and more recently fatigue. She has had episodes of dizziness and near syncope. She has had some cough associated chest discomfort but not exertional chest pain. She says that recently she is extremely fatigued by the time she gets up in the morning and gets ready for work. She works for IT at [employer] She has no peripheral edema. She denies PND and orthopnea. She had an echocardiogram in July 2020 which showed a mean gradient of 38 mmHg across the aortic valve with a peak gradient of 70 mmHg. Aortic valve area was 0.89 cm. Left ventricular systolic function was normal. Follow-up echocardiogram on 06/08/2020 showed progression to critical aortic stenosis with a mean gradient of 62 mmHg and a peak gradient of 105 mmHg. Aortic valve area was 0.8 cm. Left ventricular ejection fraction is 60 to 65%. 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 Right heart pressures were normal.  She has been widowed since 1989 and has 1 daughter who [place] and [place].       Past Medical History:  Diagnosis Date  . [place]   [place]IOFREQUENCY  [place]  . Barrett esophagus   . Bee sting allergy   . Diverticulitis   . DJD (degenerative joint disease) of knee    KNEES, BACK (LUMBAR FACET DISEASE) AND HIPS, DALLDORF.  .Marland KitchenGERD (gastroesophageal reflux disease)   . Hiatal hernia   . History of syncope    RECURRENT  . Hyperlipidemia    BORDERLINE  . Hypertension   . Migraines   . Obesity   . Osteopenia   . S/P RF ablation operation for arrhythmia   . Severe aortic stenosis   . Shoulder tendinitis, left 2008        Past Surgical History:  Procedure Laterality Date  . ABDOMINAL HYSTERECTOMY    . BREAST BIOPSY Left    10 yrs +  . CESAREAN SECTION     1979, 1984  . CHOLECYSTECTOMY    . COLONOSCOPY  11/03/2012   Procedure: COLONOSCOPY; Surgeon: MGarlan Fair MD; Location: [facility]; Service: Endoscopy; Laterality: N/A;  . ESOPHAGOGASTRODUODENOSCOPY  11/03/2012   Procedure: ESOPHAGOGASTRODUODENOSCOPY (EGD); Surgeon: MGarlan Fair MD; [facility]; Service: Endoscopy; Laterality: N/A;  . implanted loop device    . RIGHT HEART CATH AND CORONARY ANGIOGRAPHY N/A 07/07/2020   Procedure: RIGHT HEART CATH AND CORONARY ANGIOGRAPHY; Surgeon: CSherren Mocha MD; [facility]; Service: Cardiovascular; Laterality: N/A;  . TOTAL HIP ARTHROPLASTY Right 11/23/2019  Procedure: RIGHT TOTAL HIP ARTHROPLASTY ANTERIOR APPROACH; Surgeon: Melrose Nakayama, MD; Location: [facility]; Service: Orthopedics; Laterality: Right;        Family History  Problem Relation Age of Onset  . Hypertension Mother   . Heart failure Mother   . Aneurysm Mother   . Kidney failure Mother   . Emphysema Father   . Prostate cancer Father   . Lung cancer Father   . Colon cancer Sister 51   dx 3 times; Lynch syndrome (PMS2)  . Melanoma Sister 44  . Other Sister    genetic testing - PMS2+, BRIP1+, MITF+  . Melanoma Sister   . Other Sister    genetic testing - PMS2+  . Idiopathic pulmonary fibrosis Brother   .  Lung cancer Maternal Uncle   . Liver cancer Maternal Uncle   . Lung cancer Paternal Uncle   . Ovarian cancer Paternal Grandmother    died early 36s  . Other Sister    genetic testing - BRIP1+, PMS2-, MITF-  . Other Sister    genetic testing - BRIP1+, MITF+, PMS2-  . Colon cancer Brother 65   Social History        Socioeconomic History  . Marital status: Widowed    Spouse name: Not on file  . Number of children: Not on file  . Years of education: Not on file  . Highest education level: Not on file  Occupational History  . Not on file  Tobacco Use  . Smoking status: Never Smoker  . Smokeless tobacco: Never Used  Vaping Use  . Vaping Use: Never used  Substance and Sexual Activity  . Alcohol use: Yes    Comment: OCCASIONALY  . Drug use: No  . Sexual activity: Not on file  Other Topics Concern  . Not on file  Social History Narrative  . Not on file   Social Determinants of Health      Financial Resource Strain:   . Difficulty of Paying Living Expenses: Not on file  Food Insecurity:   . Worried About Charity fundraiser in the Last Year: Not on file  . Ran Out of Food in the Last Year: Not on file  Transportation Needs:   . Lack of Transportation (Medical): Not on file  . Lack of Transportation (Non-Medical): Not on file  Physical Activity:   . Days of Exercise per Week: Not on file  . Minutes of Exercise per Session: Not on file  Stress:   . Feeling of Stress : Not on file  Social Connections:   . Frequency of Communication with Friends and Family: Not on file  . Frequency of Social Gatherings with Friends and Family: Not on file  . Attends Religious Services: Not on file  . Active Member of Clubs or Organizations: Not on file  . Attends Archivist Meetings: Not on file  . Marital Status: Not on file  Intimate Partner Violence:   . Fear of Current or Ex-Partner: Not on file  . Emotionally Abused: Not on file  . Physically Abused: Not on file  .  Sexually Abused: Not on file         Current Outpatient Medications  Medication Sig Dispense Refill  . acetaminophen (TYLENOL) 500 MG tablet Take 500-1,000 mg by mouth every 6 (six) hours as needed (for pain.).    Marland Kitchen Ascorbic Acid (VITAMIN C WITH ROSE HIPS) 500 MG tablet Take 500 mg by mouth daily.    Marland Kitchen b complex vitamins  tablet Take 1 tablet by mouth daily.    . B Complex-C (B-COMPLEX WITH VITAMIN C) tablet Take 1 tablet by mouth daily.    . Calcium Carb-Cholecalciferol (CALCIUM+D3 PO) Take 1 tablet by mouth daily.    Marland Kitchen EPINEPHrine (EPIPEN 2-PAK) 0.3 mg/0.3 mL IJ SOAJ injection Inject 0.3 mg into the muscle as needed for anaphylaxis.    . folic acid (FOLVITE) 1 MG tablet Take 1 mg by mouth daily.    . Galcanezumab-gnlm (EMGALITY) 120 MG/ML SOAJ Inject 120 mg into the skin every 30 (thirty) days. 1.12 mL 11  . Ginkgo Biloba 120 MG TABS Take 120 mg by mouth daily.    . Glucosamine-Chondroitin (COSAMIN DS PO) Take 1-2 tablets by mouth See admin instructions. Take 2 tablets by mouth in the morning & take 1 tablet by mouth at night.    . metoprolol tartrate (LOPRESSOR) 50 MG tablet Take one tablet by mouth as directed prior to 10/6 CT scan 1 tablet 0  . Multiple Vitamin (MULTIVITAMIN WITH MINERALS) TABS tablet Take 1 tablet by mouth daily.    . Multiple Vitamins-Minerals (EMERGEN-C IMMUNE PLUS) PACK Take 1 packet by mouth daily.    . Omega-3 1000 MG CAPS Take 2,000 mg by mouth daily.    Marland Kitchen omeprazole (PRILOSEC) 20 MG capsule Take 20 mg by mouth daily before breakfast.     . Polyethyl Glycol-Propyl Glycol (LUBRICANT EYE DROPS) 0.4-0.3 % SOLN Place 1 drop into both eyes 3 (three) times daily as needed (dry/irritated eyes.).    Marland Kitchen Rimegepant Sulfate (NURTEC) 75 MG TBDP Take 75 mg by mouth daily as needed. For migraines. Take as close to onset of migraine as possible. One daily maximum. (Patient taking differently: Take 75 mg by mouth daily as needed (migraines). For migraines. Take as close to onset of  migraine as possible. One daily maximum.) 8 tablet 6  . Turmeric 500 MG TABS Take 1,000 mg by mouth daily.    . valsartan-hydrochlorothiazide (DIOVAN-HCT) 160-12.5 MG tablet Take 1 tablet by mouth daily.      No current facility-administered medications for this visit.        Allergies  Allergen Reactions  . Penicillins Anaphylaxis, Hives and Other (See Comments)    Has patient had a PCN reaction causing immediate rash, facial/tongue/throat swelling, SOB or lightheadedness with hypotension: No  Has patient had a PCN reaction causing severe rash involving mucus membranes or skin necrosis: No  Has patient had a PCN reaction that required hospitalization No  Has patient had a PCN reaction occurring within the last 10 years: No  If all of the above answers are "NO", then may proceed with Cephalosporin use.  . Codeine     STOMACH SICKNESS  . Mobic [Meloxicam]     DIFFICULT BREATHING  . Nsaids Itching and Nausea And Vomiting   Review of Systems:   General: + decreased appetite, + decreased energy, no weight gain, no weight loss, no fever  Cardiac: no chest pain with exertion, no chest pain at rest, +SOB with mild exertion, no resting SOB, no PND, no orthopnea, no palpitations, no arrhythmia, no atrial fibrillation, no LE edema, + dizzy spells, no syncope  Respiratory: + exertional shortness of breath, no home oxygen, no productive cough, + dry cough, no bronchitis, no wheezing, no hemoptysis, no asthma, no pain with inspiration or cough, no sleep apnea, no CPAP at night  GI: no difficulty swallowing, + reflux, + frequent heartburn, + hiatal hernia, no abdominal pain, no constipation, no  diarrhea, no hematochezia, no hematemesis, no melena  GU: no dysuria, no frequency, no urinary tract infection, no hematuria, no kidney stones, no kidney disease  Vascular: no pain suggestive of claudication, no pain in feet, no leg cramps, no varicose veins, no DVT, no non-healing foot ulcer  Neuro: no stroke,  no TIA's, no seizures, no headaches, no temporary blindness one eye, no slurred speech, no peripheral neuropathy, no chronic pain, no instability of gait, no memory/cognitive dysfunction  Musculoskeletal: + arthritis, no joint swelling, no myalgias, no difficulty walking, normal mobility  Skin: no rash, + itching, no skin infections, no pressure sores or ulcerations  Psych: no anxiety, no depression, no nervousness, no unusual recent stress  Eyes: no blurry vision, + floaters, no recent vision changes, + wears glasses or contacts  ENT: no hearing loss, no loose or painful teeth, + dentures, last saw dentist 06/2020  Hematologic: + easy bruising, no abnormal bleeding, no clotting disorder, no frequent epistaxis  Endocrine: no diabetes, does not check CBG's at home    Physical Exam:   BP 131/80 (BP Location: Right Arm, Patient Position: Sitting)  Pulse 72  Temp (!) 97.5 F (36.4 C)  Resp 18  Ht 5' 5"  (1.651 m)  Wt 192 lb 9.6 oz (87.4 kg)  SpO2 94% Comment: RA with mask on  BMI 32.05 kg/m  General: well-appearing  HEENT: Unremarkable, NCAT, PERLA, EOMI  Neck: no JVD, no bruits, no adenopathy  Chest: clear to auscultation, symmetrical breath sounds, no wheezes, no rhonchi  CV: RRR, grade lll/VI crescendo/decrescendo murmur heard best at RSB, no diastolic murmur  Abdomen: soft, non-tender, no masses  Extremities: warm, well-perfused, pulses palpable at ankles, no LE edema  Rectal/GU Deferred  Neuro: Grossly non-focal and symmetrical throughout  Skin: Clean and dry, no rashes, no breakdown    Diagnostic Tests:   ECHOCARDIOGRAM REPORT     Patient Name: Rebecca Farley Date of Exam: 06/08/2020  Medical Rec #: [id number] Height: 65.0 in  Accession #: [id number] Weight: 192.4 lb  Date of Birth: 1956-08-04 BSA: 1.946 m  Patient Age: 4 years BP: 116/86 mmHg  Patient Gender: F HR: 81 bpm.  Exam Location: [address]   Procedure: 2D Echo, 3D Echo, Cardiac Doppler, Color Doppler and  Strain  Analysis   Indications: I35 Aortic stenosis   History: Patient has prior history of Echocardiogram examinations,  most  recent 04/14/2019. Aortic Valve Disease, Arrythmias:Atrial  Flutter, Signs/Symptoms:Murmur; Risk Factors:Hypertension,  Dyslipidemia and Obesity.   Sonographer: Jessee Avers, RDCS  Referring Phys: [place]    1. Since the last study on 04/14/2019 there has been a significant change -  aortic stenosis is now critical with mean gradient 62 mmHg.  2. Left ventricular ejection fraction, by estimation, is 60 to 65%. The  left ventricle has normal function. The left ventricle has no regional  wall motion abnormalities. There is moderate concentric left ventricular  hypertrophy. Left ventricular  diastolic parameters are consistent with Grade I diastolic dysfunction  (impaired relaxation). The average left ventricular global longitudinal  strain is -22.0 %. The global longitudinal strain is normal.  3. Right ventricular systolic function is normal. The right ventricular  size is normal.  4. The mitral valve is normal in structure. Mild mitral valve  regurgitation. No evidence of mitral stenosis.  5. The aortic valve is normal in structure. Aortic valve regurgitation is  mild. Critical aortic stenosis. Aortic valve mean gradient measures 62.0  mmHg.  6. The inferior vena cava is normal in size with greater than 50%  respiratory variability, suggesting right atrial pressure of 3 mmHg.   Comparison(s): 04/14/19 EF >65%. Severe AS.   FINDINGS  Left Ventricle: Left ventricular ejection fraction, by estimation, is 60  to 65%. The left ventricle has normal function. The left ventricle has no  regional wall motion abnormalities. The average left ventricular global  longitudinal strain is -22.0 %.  The global longitudinal strain is normal. The left ventricular internal  cavity size was normal in size. There is moderate concentric left   ventricular hypertrophy. Left ventricular diastolic parameters are  consistent with Grade I diastolic dysfunction  (impaired relaxation). Normal left ventricular filling pressure.   Right Ventricle: The right ventricular size is normal. No increase in  right ventricular wall thickness. Right ventricular systolic function is  normal.   Left Atrium: Left atrial size was normal in size.   Right Atrium: Right atrial size was normal in size.   Pericardium: There is no evidence of pericardial effusion.   Mitral Valve: The mitral valve is normal in structure. There is moderate  thickening of the mitral valve leaflet(s). There is moderate calcification  of the mitral valve leaflet(s). Normal mobility of the mitral valve  leaflets. Mild mitral valve  regurgitation. No evidence of mitral valve stenosis.   Tricuspid Valve: The tricuspid valve is normal in structure. Tricuspid  valve regurgitation is mild . No evidence of tricuspid stenosis.   Aortic Valve: The aortic valve is normal in structure.. There is severe  thickening and severe calcifcation of the aortic valve. Aortic valve  regurgitation is mild. Critical aortic stenosis. There is severe  thickening of the aortic valve. There is severe  calcifcation of the aortic valve. Aortic valve mean gradient measures 62.0  mmHg. Aortic valve peak gradient measures 105.3 mmHg. Aortic valve area,  by VTI measures 0.80 cm.   Pulmonic Valve: The pulmonic valve was normal in structure. Pulmonic valve  regurgitation is not visualized. No evidence of pulmonic stenosis.   Aorta: The aortic root is normal in size and structure.   Venous: The inferior vena cava is normal in size with greater than 50%  respiratory variability, suggesting right atrial pressure of 3 mmHg.   IAS/Shunts: No atrial level shunt detected by color flow Doppler.    LEFT VENTRICLE  PLAX 2D  LVIDd: 3.10 cm Diastology  LVIDs: 2.10 cm LV e' lateral: 3.26 cm/s  LV PW: 1.30  cm LV E/e' lateral: 26.7  LV IVS: 1.50 cm LV e' medial: 4.68 cm/s  LVOT diam: 2.00 cm LV E/e' medial: 18.6  LV SV: 94  LV SV Index: 48 2D Longitudinal Strain  LVOT Area: 3.14 cm 2D Strain GLS (A2C): -24.1 %  2D Strain GLS (A3C): -20.0 %  2D Strain GLS (A4C): -21.8 %  2D Strain GLS Avg: -22.0 %   3D Volume EF:  3D EF: 59 %  LV EDV: 115 ml  LV ESV: 47 ml  LV SV: 68 ml   RIGHT VENTRICLE  RV Basal diam: 3.50 cm  RV S prime: 12.20 cm/s  TAPSE (M-mode): 1.6 cm   LEFT ATRIUM Index RIGHT ATRIUM Index  LA diam: 3.40 cm 1.75 cm/m RA Pressure: 3.00 mmHg  LA Vol (A2C): 21.7 ml 11.15 ml/m RA Area: 9.66 cm  LA Vol (A4C): 17.8 ml 9.15 ml/m RA Volume: 21.10 ml 10.84 ml/m  LA Biplane Vol: 20.4 ml 10.48 ml/m  AORTIC VALVE  AV Area (Vmax): 0.75  cm  AV Area (Vmean): 0.73 cm  AV Area (VTI): 0.80 cm  AV Vmax: 513.00 cm/s  AV Vmean: 341.200 cm/s  AV VTI: 1.170 m  AV Peak Grad: 105.3 mmHg  AV Mean Grad: 62.0 mmHg  LVOT Vmax: 123.00 cm/s  LVOT Vmean: 79.000 cm/s  LVOT VTI: 0.299 m  LVOT/AV VTI ratio: 0.26   AORTA  Ao Root diam: 3.50 cm  Ao Asc diam: 3.70 cm   MITRAL VALVE TRICUSPID VALVE  Estimated RAP: 3.00 mmHg   MV E velocity: 86.90 cm/s SHUNTS  MV A velocity: 138.00 cm/s Systemic VTI: 0.30 m  MV E/A ratio: 0.63 Systemic Diam: 2.00 cm   Ena Dawley MD  Electronically signed by Ena Dawley MD  Signature Date/Time: 06/08/2020/4:35:48 PM      Physicians  Panel Physicians Referring Physician Case Authorizing Physician  Sherren Mocha, MD (Primary)    Procedures  RIGHT HEART CATH AND CORONARY ANGIOGRAPHY  Conclusion  1. Known severe aortic stenosis by noninvasive assessment. Heavily calcified, restricted aortic valve by plain fluoroscopy  2. Patent coronary arteries with very mild nonobstructive irregularity in the RCA and LAD, no significant stenosis  3. Normal right heart pressures  Plan: continue multidisciplinary heart team evaluation for treatment of  severe, stage D1 aortic stenosis  Indications  Severe aortic stenosis [I35.0 (ICD-10-CM)]  Procedural Details  Technical Details INDICATION: Severe symptomatic aortic stenosis  PROCEDURAL DETAILS: There was an indwelling IV in a right antecubital vein. Using normal sterile technique, the IV was changed out for a 5 Fr brachial sheath over a 0.018 inch wire. The right wrist was then prepped, draped, and anesthetized with 1% lidocaine. Using the modified Seldinger technique a 5/6 French Slender sheath was placed in the right radial artery. Intra-arterial verapamil was administered through the radial artery sheath. IV heparin was administered after a JR4 catheter was advanced into the central aorta. A Swan-Ganz catheter was used for the right heart catheterization. Standard protocol was followed for recording of right heart pressures and sampling of oxygen saturations. Fick cardiac output was calculated. Standard Judkins catheters were used for selective coronary angiography. The aortic valve is not crossed. There were no immediate procedural complications. The patient was transferred to the post catheterization recovery area for further monitoring.      Estimated blood loss <50 mL.   During this procedure medications were administered to achieve and maintain moderate conscious sedation while the patient's heart rate, blood pressure, and oxygen saturation were continuously monitored and I was present face-to-face 100% of this time.  Medications  (Filter: Administrations occurring from 1015 to 1103 on 07/07/20)  (important) Continuous medications are totaled by the amount administered until 07/07/20 1103.  fentaNYL (SUBLIMAZE) injection (mcg)  Total dose: 25 mcg  Date/Time  Rate/Dose/Volume Action  07/07/20 1027  25 mcg Given  midazolam (VERSED) injection (mg)  Total dose: 2 mg  Date/Time  Rate/Dose/Volume Action  07/07/20 1027  2 mg Given  Heparin (Porcine) in NaCl 1000-0.9 UT/500ML-% SOLN (mL)   Total volume: 1,000 mL  Date/Time  Rate/Dose/Volume Action  07/07/20 1027  500 mL Given  1028  500 mL Given  lidocaine (PF) (XYLOCAINE) 1 % injection (mL)  Total volume: 4 mL  Date/Time  Rate/Dose/Volume Action  07/07/20 1035  2 mL Given  1039  2 mL Given  Radial Cocktail/Verapamil only (mL)  Total volume: 10 mL  Date/Time  Rate/Dose/Volume Action  07/07/20 1039  10 mL Given  heparin sodium (porcine) injection (Units)  Total dose: 4,000 Units  Date/Time  Rate/Dose/Volume Action  07/07/20 1047  4,000 Units Given  iohexol (OMNIPAQUE) 350 MG/ML injection (mL)  Total volume: 40 mL  Date/Time  Rate/Dose/Volume Action  07/07/20 1057  40 mL Given  Sedation Time  Sedation Time Physician-1: 25 minutes 34 seconds  Contrast  Medication Name Total Dose  iohexol (OMNIPAQUE) 350 MG/ML injection 40 mL  Radiation/Fluoro  Fluoro time: 2.9 (min)  DAP: 8070 (mGycm2)  Cumulative Air Kerma: 146 (mGy)  Coronary Findings  Diagnostic  Dominance: Right  Left Anterior Descending  The vessel exhibits minimal luminal irregularities.  Right Coronary Artery  The vessel exhibits minimal luminal irregularities.  Intervention  No interventions have been documented.  Coronary Diagrams  Diagnostic  Dominance: Right   Intervention  Implants     No implant documentation for this case.  Syngo Images  Show images for CARDIAC CATHETERIZATION  Images on Long Term Storage  Show images for [place], [place] to Procedure Log    Procedure Log  Hemo Data   Most Recent Value  Fick Cardiac Output 8.27 L/min  Fick Cardiac Output Index 4.28 (L/min)/BSA  RA A Wave 7 mmHg  RA V Wave 4 mmHg  RA Mean 3 mmHg  RV Systolic Pressure 27 mmHg  RV Diastolic Pressure 0 mmHg  RV EDP 3 mmHg  PA Systolic Pressure 25 mmHg  PA Diastolic Pressure 11 mmHg  PA Mean 18 mmHg  PW A Wave 15 mmHg  PW V Wave 15 mmHg  PW Mean 11 mmHg  AO Systolic Pressure 932 mmHg  AO Diastolic Pressure 72 mmHg  AO Mean 92 mmHg  QP/QS  1.2  TPVR Index 4.21 HRUI  TSVR Index 21.51 HRUI  PVR SVR Ratio 0.08  TPVR/TSVR Ratio 0.2     ADDENDUM REPORT: 07/12/2020 11:26  CLINICAL DATA: Aortic stenosis  EXAM:  Cardiac TAVR CT  TECHNIQUE:  The patient was scanned on a Siemens Force 355 slice scanner. A 120  kV retrospective scan was triggered in the descending thoracic aorta  at 111 HU's. Gantry rotation speed was 270 msecs and collimation was  .9 mm. No beta blockade or nitro were given. The 3D data set was  reconstructed in 5% intervals of the R-R cycle. Systolic and  diastolic phases were analyzed on a dedicated work station using  MPR, MIP and VRT modes. The patient received 80 cc of contrast.  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  Aorta: Moderate calcific atherosclerosis with normal arch vessels  and no aneurysm  Sinotubular Junction: 24 mm  Ascending Thoracic Aorta: 34 mm  Aortic Arch: 29 mm  Descending Thoracic Aorta:  Sinus of Valsalva Measurements:  Non-coronary: 30.5 mm  Right - coronary: 26 mm  Left - coronary: 28 mm  Coronary Artery Height above Annulus:  Left Main: 11.5 mm above annulus  Right Coronary: 11.5 mm above annulus  Virtual Basal Annulus Measurements:  Maximum/Minimum Diameter: 25 mm x 20.8 mm  Perimeter: 72 mm  Area: 425 mm2  Coronary Arteries: Sufficient height above annulus for deployment  Optimum Fluoroscopic Angle for Delivery: LAO 14 Caudal 14 degrees  IMPRESSION:  1. Functionally bicuspid AV with annular area of 425 mm2 suitable  for a 23 mm Sapien 3 valve  2. Normal aortic root 3.4 cm  3. Coronaries sufficient height above annulus for deployment  4. Optimum angiographic angle for deployment LAO 14 Caudal 14  degrees  Jenkins Rouge  Electronically Signed  By: Jenkins Rouge M.D.  On:  07/12/2020 11:26    STS Risk Calculator:   Isolated AVR:  Risk of Mortality:  1.123%  Renal Failure:  1.253%  Permanent Stroke:  1.200%  Prolonged  Ventilation:  3.900%  DSW Infection:  0.077%  Reoperation:  2.579%  Morbidity or Mortality:  6.861%  Short Length of Stay:  46.344%  Long Length of Stay:  3.039%    Impression:   This 64 year old woman has stage D, critical, symptomatic aortic stenosis with New York Heart Association class III symptoms of exertional shortness of breath and fatigue consistent with chronic diastolic congestive heart failure. I have personally reviewed her 2D echocardiogram, cardiac catheterization, and CTA studies. Her echocardiogram shows a severely calcified aortic valve that looks functionally bicuspid with fusion of the left and right cusps and restricted leaflet mobility. The mean gradient is 62 mmHg with a valve area of 0.8 cm consistent with critical aortic stenosis. Cardiac catheterization shows nonobstructive coronary disease. Her gated cardiac CTA shows a functionally bicuspid aortic valve with fusion of the left and right cusps. Given her age of 57, bicuspid aortic valve disease, and low surgical risk I think the best option for her is open surgical aortic valve replacement. This will significantly decrease the risk of structural valve deterioration and paravalvular leak. I discussed the case with Dr. Burt Knack with the multidisciplinary heart valve team and we are both in agreement with proceeding with open surgical aortic valve replacement.  The patient and her daughter were counseled at length regarding treatment alternatives for management of severe symptomatic aortic stenosis. Alternative approaches such as conventional aortic valve replacement, transcatheter aortic valve replacement, and palliative medical therapy were compared and contrasted at length. The risks associated with conventional surgical aortic valve replacement were been discussed in detail, as were expectations for post-operative convalescence. Long-term prognosis with medical therapy was discussed. She is in agreement with proceeding  with open surgical aortic valve replacement.  I discussed the operative procedure with the patient and her daughter including benefits and risks; including but not limited to bleeding, blood transfusion, infection, stroke, myocardial infarction, heart block requiring a permanent pacemaker, organ dysfunction, and death. I also discussed the longer-term risks of structural valve deterioration and prosthetic valve endocarditis. Anner Crete understands and agrees to proceed.   Plan:   Surgical aortic valve replacement using a bioprosthetic valve.   Gaye Pollack, MD

## 2020-07-27 ENCOUNTER — Encounter (HOSPITAL_COMMUNITY)
Admission: RE | Disposition: A | Discharge status: Home / Self Care | Payer: Self-pay | Source: Home / Self Care | Attending: Surgery

## 2020-07-27 ENCOUNTER — Inpatient Hospital Stay (HOSPITAL_COMMUNITY): Payer: Medicare Other

## 2020-07-27 ENCOUNTER — Inpatient Hospital Stay (HOSPITAL_COMMUNITY): Payer: Medicare Other | Admitting: Physician Assistant

## 2020-07-27 ENCOUNTER — Inpatient Hospital Stay (HOSPITAL_COMMUNITY)
Admission: RE | Admit: 2020-07-27 | Discharge: 2020-08-03 | DRG: 220 | Disposition: A | Discharge status: Home / Self Care | Payer: Medicare Other | Attending: Surgery | Admitting: Surgery

## 2020-07-27 ENCOUNTER — Inpatient Hospital Stay (HOSPITAL_COMMUNITY): Payer: Medicare Other | Admitting: Certified Registered Nurse Anesthetist

## 2020-07-27 ENCOUNTER — Encounter (HOSPITAL_COMMUNITY): Payer: Self-pay | Admitting: Surgery

## 2020-07-27 ENCOUNTER — Other Ambulatory Visit: Payer: Self-pay

## 2020-07-27 DIAGNOSIS — I4891 Unspecified atrial fibrillation: Secondary | ICD-10-CM | POA: Diagnosis not present

## 2020-07-27 DIAGNOSIS — J90 Pleural effusion, not elsewhere classified: Secondary | ICD-10-CM

## 2020-07-27 DIAGNOSIS — I5032 Chronic diastolic (congestive) heart failure: Secondary | ICD-10-CM | POA: Diagnosis present

## 2020-07-27 DIAGNOSIS — Z20822 Contact with and (suspected) exposure to covid-19: Secondary | ICD-10-CM | POA: Diagnosis present

## 2020-07-27 DIAGNOSIS — E8770 Fluid overload, unspecified: Secondary | ICD-10-CM | POA: Diagnosis not present

## 2020-07-27 DIAGNOSIS — D6959 Other secondary thrombocytopenia: Secondary | ICD-10-CM | POA: Diagnosis not present

## 2020-07-27 DIAGNOSIS — T801XXA Vascular complications following infusion, transfusion and therapeutic injection, initial encounter: Secondary | ICD-10-CM | POA: Diagnosis not present

## 2020-07-27 DIAGNOSIS — Z6832 Body mass index (BMI) 32.0-32.9, adult: Secondary | ICD-10-CM | POA: Diagnosis not present

## 2020-07-27 DIAGNOSIS — E669 Obesity, unspecified: Secondary | ICD-10-CM | POA: Diagnosis present

## 2020-07-27 DIAGNOSIS — I110 Hypertensive heart disease with heart failure: Secondary | ICD-10-CM | POA: Diagnosis present

## 2020-07-27 DIAGNOSIS — Z952 Presence of prosthetic heart valve: Secondary | ICD-10-CM

## 2020-07-27 DIAGNOSIS — E876 Hypokalemia: Secondary | ICD-10-CM | POA: Diagnosis not present

## 2020-07-27 DIAGNOSIS — T462X5A Adverse effect of other antidysrhythmic drugs, initial encounter: Secondary | ICD-10-CM | POA: Diagnosis not present

## 2020-07-27 DIAGNOSIS — E785 Hyperlipidemia, unspecified: Secondary | ICD-10-CM | POA: Diagnosis present

## 2020-07-27 DIAGNOSIS — K219 Gastro-esophageal reflux disease without esophagitis: Secondary | ICD-10-CM | POA: Diagnosis present

## 2020-07-27 DIAGNOSIS — I350 Nonrheumatic aortic (valve) stenosis: Principal | ICD-10-CM | POA: Diagnosis present

## 2020-07-27 DIAGNOSIS — D62 Acute posthemorrhagic anemia: Secondary | ICD-10-CM | POA: Diagnosis not present

## 2020-07-27 DIAGNOSIS — Z79899 Other long term (current) drug therapy: Secondary | ICD-10-CM | POA: Diagnosis not present

## 2020-07-27 DIAGNOSIS — I498 Other specified cardiac arrhythmias: Secondary | ICD-10-CM | POA: Diagnosis not present

## 2020-07-27 HISTORY — PX: AORTIC VALVE REPLACEMENT: SHX41

## 2020-07-27 HISTORY — PX: TEE WITHOUT CARDIOVERSION: SHX5443

## 2020-07-27 LAB — POCT I-STAT 7, (LYTES, BLD GAS, ICA,H+H)
Acid-Base Excess: 4 mmol/L — ABNORMAL HIGH (ref 0.0–2.0)
Acid-Base Excess: 5 mmol/L — ABNORMAL HIGH (ref 0.0–2.0)
Acid-Base Excess: 2 mmol/L (ref 0.0–2.0)
Acid-Base Excess: 4 mmol/L — ABNORMAL HIGH (ref 0.0–2.0)
Acid-Base Excess: 1 mmol/L (ref 0.0–2.0)
Acid-base deficit: 1 mmol/L (ref 0.0–2.0)
Acid-base deficit: 2 mmol/L (ref 0.0–2.0)
Bicarbonate: 28.5 mmol/L — ABNORMAL HIGH (ref 20.0–28.0)
Bicarbonate: 29.5 mmol/L — ABNORMAL HIGH (ref 20.0–28.0)
Bicarbonate: 27.6 mmol/L (ref 20.0–28.0)
Bicarbonate: 29.3 mmol/L — ABNORMAL HIGH (ref 20.0–28.0)
Bicarbonate: 24.8 mmol/L (ref 20.0–28.0)
Bicarbonate: 25.8 mmol/L (ref 20.0–28.0)
Bicarbonate: 24.1 mmol/L (ref 20.0–28.0)
Calcium, Ion: 1.08 mmol/L — ABNORMAL LOW (ref 1.15–1.40)
Calcium, Ion: 0.9 mmol/L — ABNORMAL LOW (ref 1.15–1.40)
Calcium, Ion: 1 mmol/L — ABNORMAL LOW (ref 1.15–1.40)
Calcium, Ion: 1.01 mmol/L — ABNORMAL LOW (ref 1.15–1.40)
Calcium, Ion: 1.05 mmol/L — ABNORMAL LOW (ref 1.15–1.40)
Calcium, Ion: 1.04 mmol/L — ABNORMAL LOW (ref 1.15–1.40)
Calcium, Ion: 1.05 mmol/L — ABNORMAL LOW (ref 1.15–1.40)
HCT: 30 % — ABNORMAL LOW (ref 36.0–46.0)
HCT: 22 % — ABNORMAL LOW (ref 36.0–46.0)
HCT: 24 % — ABNORMAL LOW (ref 36.0–46.0)
HCT: 28 % — ABNORMAL LOW (ref 36.0–46.0)
HCT: 24 % — ABNORMAL LOW (ref 36.0–46.0)
HCT: 23 % — ABNORMAL LOW (ref 36.0–46.0)
HCT: 23 % — ABNORMAL LOW (ref 36.0–46.0)
Hemoglobin: 10.2 g/dL — ABNORMAL LOW (ref 12.0–15.0)
Hemoglobin: 7.5 g/dL — ABNORMAL LOW (ref 12.0–15.0)
Hemoglobin: 8.2 g/dL — ABNORMAL LOW (ref 12.0–15.0)
Hemoglobin: 9.5 g/dL — ABNORMAL LOW (ref 12.0–15.0)
Hemoglobin: 8.2 g/dL — ABNORMAL LOW (ref 12.0–15.0)
Hemoglobin: 7.8 g/dL — ABNORMAL LOW (ref 12.0–15.0)
Hemoglobin: 7.8 g/dL — ABNORMAL LOW (ref 12.0–15.0)
O2 Saturation: 100 %
O2 Saturation: 100 %
O2 Saturation: 100 %
O2 Saturation: 100 %
O2 Saturation: 98 %
O2 Saturation: 99 %
O2 Saturation: 97 %
Patient temperature: 35
Patient temperature: 36.7
Patient temperature: 36.7
Potassium: 3 mmol/L — ABNORMAL LOW (ref 3.5–5.1)
Potassium: 3.5 mmol/L (ref 3.5–5.1)
Potassium: 3.7 mmol/L (ref 3.5–5.1)
Potassium: 4 mmol/L (ref 3.5–5.1)
Potassium: 3.4 mmol/L — ABNORMAL LOW (ref 3.5–5.1)
Potassium: 4 mmol/L (ref 3.5–5.1)
Potassium: 3.5 mmol/L (ref 3.5–5.1)
Sodium: 142 mmol/L (ref 135–145)
Sodium: 141 mmol/L (ref 135–145)
Sodium: 139 mmol/L (ref 135–145)
Sodium: 140 mmol/L (ref 135–145)
Sodium: 143 mmol/L (ref 135–145)
Sodium: 141 mmol/L (ref 135–145)
Sodium: 142 mmol/L (ref 135–145)
TCO2: 30 mmol/L (ref 22–32)
TCO2: 31 mmol/L (ref 22–32)
TCO2: 29 mmol/L (ref 22–32)
TCO2: 31 mmol/L (ref 22–32)
TCO2: 26 mmol/L (ref 22–32)
TCO2: 27 mmol/L (ref 22–32)
TCO2: 25 mmol/L (ref 22–32)
pCO2 arterial: 41.7 mmHg (ref 32.0–48.0)
pCO2 arterial: 40.4 mmHg (ref 32.0–48.0)
pCO2 arterial: 45.4 mmHg (ref 32.0–48.0)
pCO2 arterial: 47.5 mmHg (ref 32.0–48.0)
pCO2 arterial: 43.2 mmHg (ref 32.0–48.0)
pCO2 arterial: 41.3 mmHg (ref 32.0–48.0)
pCO2 arterial: 43.9 mmHg (ref 32.0–48.0)
pH, Arterial: 7.443 (ref 7.350–7.450)
pH, Arterial: 7.472 — ABNORMAL HIGH (ref 7.350–7.450)
pH, Arterial: 7.392 (ref 7.350–7.450)
pH, Arterial: 7.397 (ref 7.350–7.450)
pH, Arterial: 7.357 (ref 7.350–7.450)
pH, Arterial: 7.402 (ref 7.350–7.450)
pH, Arterial: 7.347 — ABNORMAL LOW (ref 7.350–7.450)
pO2, Arterial: 238 mmHg — ABNORMAL HIGH (ref 83.0–108.0)
pO2, Arterial: 445 mmHg — ABNORMAL HIGH (ref 83.0–108.0)
pO2, Arterial: 312 mmHg — ABNORMAL HIGH (ref 83.0–108.0)
pO2, Arterial: 390 mmHg — ABNORMAL HIGH (ref 83.0–108.0)
pO2, Arterial: 102 mmHg (ref 83.0–108.0)
pO2, Arterial: 122 mmHg — ABNORMAL HIGH (ref 83.0–108.0)
pO2, Arterial: 99 mmHg (ref 83.0–108.0)

## 2020-07-27 LAB — POCT I-STAT, CHEM 8
BUN: 18 mg/dL (ref 8–23)
BUN: 14 mg/dL (ref 8–23)
BUN: 17 mg/dL (ref 8–23)
BUN: 15 mg/dL (ref 8–23)
BUN: 16 mg/dL (ref 8–23)
Calcium, Ion: 1.21 mmol/L (ref 1.15–1.40)
Calcium, Ion: 0.88 mmol/L — CL (ref 1.15–1.40)
Calcium, Ion: 1.14 mmol/L — ABNORMAL LOW (ref 1.15–1.40)
Calcium, Ion: 1.04 mmol/L — ABNORMAL LOW (ref 1.15–1.40)
Calcium, Ion: 1.06 mmol/L — ABNORMAL LOW (ref 1.15–1.40)
Chloride: 102 mmol/L (ref 98–111)
Chloride: 101 mmol/L (ref 98–111)
Chloride: 98 mmol/L (ref 98–111)
Chloride: 99 mmol/L (ref 98–111)
Chloride: 100 mmol/L (ref 98–111)
Creatinine, Ser: 0.5 mg/dL (ref 0.44–1.00)
Creatinine, Ser: 0.5 mg/dL (ref 0.44–1.00)
Creatinine, Ser: 0.5 mg/dL (ref 0.44–1.00)
Creatinine, Ser: 0.4 mg/dL — ABNORMAL LOW (ref 0.44–1.00)
Creatinine, Ser: 0.4 mg/dL — ABNORMAL LOW (ref 0.44–1.00)
Glucose, Bld: 103 mg/dL — ABNORMAL HIGH (ref 70–99)
Glucose, Bld: 120 mg/dL — ABNORMAL HIGH (ref 70–99)
Glucose, Bld: 96 mg/dL (ref 70–99)
Glucose, Bld: 125 mg/dL — ABNORMAL HIGH (ref 70–99)
Glucose, Bld: 130 mg/dL — ABNORMAL HIGH (ref 70–99)
HCT: 45 % (ref 36.0–46.0)
HCT: 27 % — ABNORMAL LOW (ref 36.0–46.0)
HCT: 27 % — ABNORMAL LOW (ref 36.0–46.0)
HCT: 26 % — ABNORMAL LOW (ref 36.0–46.0)
HCT: 27 % — ABNORMAL LOW (ref 36.0–46.0)
Hemoglobin: 15.3 g/dL — ABNORMAL HIGH (ref 12.0–15.0)
Hemoglobin: 9.2 g/dL — ABNORMAL LOW (ref 12.0–15.0)
Hemoglobin: 9.2 g/dL — ABNORMAL LOW (ref 12.0–15.0)
Hemoglobin: 8.8 g/dL — ABNORMAL LOW (ref 12.0–15.0)
Hemoglobin: 9.2 g/dL — ABNORMAL LOW (ref 12.0–15.0)
Potassium: 3.2 mmol/L — ABNORMAL LOW (ref 3.5–5.1)
Potassium: 3 mmol/L — ABNORMAL LOW (ref 3.5–5.1)
Potassium: 3.5 mmol/L (ref 3.5–5.1)
Potassium: 3.8 mmol/L (ref 3.5–5.1)
Potassium: 3.5 mmol/L (ref 3.5–5.1)
Sodium: 142 mmol/L (ref 135–145)
Sodium: 140 mmol/L (ref 135–145)
Sodium: 140 mmol/L (ref 135–145)
Sodium: 138 mmol/L (ref 135–145)
Sodium: 139 mmol/L (ref 135–145)
TCO2: 26 mmol/L (ref 22–32)
TCO2: 26 mmol/L (ref 22–32)
TCO2: 28 mmol/L (ref 22–32)
TCO2: 26 mmol/L (ref 22–32)
TCO2: 27 mmol/L (ref 22–32)

## 2020-07-27 LAB — POCT I-STAT EG7
Acid-Base Excess: 3 mmol/L — ABNORMAL HIGH (ref 0.0–2.0)
Bicarbonate: 28 mmol/L (ref 20.0–28.0)
Calcium, Ion: 1.04 mmol/L — ABNORMAL LOW (ref 1.15–1.40)
HCT: 23 % — ABNORMAL LOW (ref 36.0–46.0)
Hemoglobin: 7.8 g/dL — ABNORMAL LOW (ref 12.0–15.0)
O2 Saturation: 82 %
Potassium: 3.4 mmol/L — ABNORMAL LOW (ref 3.5–5.1)
Sodium: 140 mmol/L (ref 135–145)
TCO2: 29 mmol/L (ref 22–32)
pCO2, Ven: 43.7 mmHg — ABNORMAL LOW (ref 44.0–60.0)
pH, Ven: 7.415 (ref 7.250–7.430)
pO2, Ven: 47 mmHg — ABNORMAL HIGH (ref 32.0–45.0)

## 2020-07-27 LAB — BASIC METABOLIC PANEL
Anion gap: 8 (ref 5–15)
BUN: 9 mg/dL (ref 8–23)
CO2: 22 mmol/L (ref 22–32)
Calcium: 7 mg/dL — ABNORMAL LOW (ref 8.9–10.3)
Chloride: 105 mmol/L (ref 98–111)
Creatinine, Ser: 0.55 mg/dL (ref 0.44–1.00)
GFR, Estimated: >60 mL/min (ref >60)
Glucose, Bld: 148 mg/dL — ABNORMAL HIGH (ref 70–99)
Potassium: 3.3 mmol/L — ABNORMAL LOW (ref 3.5–5.1)
Sodium: 135 mmol/L (ref 135–145)

## 2020-07-27 LAB — CBC
HCT: 23.8 % — ABNORMAL LOW (ref 36.0–46.0)
HCT: 28.2 % — ABNORMAL LOW (ref 36.0–46.0)
Hemoglobin: 7.7 g/dL — ABNORMAL LOW (ref 12.0–15.0)
Hemoglobin: 9.4 g/dL — ABNORMAL LOW (ref 12.0–15.0)
MCH: 30.6 pg (ref 26.0–34.0)
MCH: 30.9 pg (ref 26.0–34.0)
MCHC: 32.4 g/dL (ref 30.0–36.0)
MCHC: 33.3 g/dL (ref 30.0–36.0)
MCV: 94.4 fL (ref 80.0–100.0)
MCV: 92.8 fL (ref 80.0–100.0)
Platelets: 105 10*3/uL — ABNORMAL LOW (ref 150–400)
Platelets: 106 10*3/uL — ABNORMAL LOW (ref 150–400)
RBC: 2.52 MIL/uL — ABNORMAL LOW (ref 3.87–5.11)
RBC: 3.04 MIL/uL — ABNORMAL LOW (ref 3.87–5.11)
RDW: 13.5 % (ref 11.5–15.5)
RDW: 13.6 % (ref 11.5–15.5)
WBC: 14.7 10*3/uL — ABNORMAL HIGH (ref 4.0–10.5)
WBC: 12.2 10*3/uL — ABNORMAL HIGH (ref 4.0–10.5)
nRBC: 0 % (ref 0.0–0.2)
nRBC: 0 % (ref 0.0–0.2)

## 2020-07-27 LAB — PROTIME-INR
INR: 1.7 — ABNORMAL HIGH (ref 0.8–1.2)
Prothrombin Time: 19.1 seconds — ABNORMAL HIGH (ref 11.4–15.2)

## 2020-07-27 LAB — ECHO INTRAOPERATIVE TEE
AR max vel: 1.02 cm2
AV Area VTI: 1.12 cm2
AV Area mean vel: 1.09 cm2
AV Mean grad: 31.8 mmHg
AV Peak grad: 56 mmHg
Ao pk vel: 3.74 m/s
Calc EF: 78.6 %
Single Plane A2C EF: 74.6 %
Single Plane A4C EF: 78.3 %

## 2020-07-27 LAB — PLATELET COUNT: Platelets: 145 10*3/uL — ABNORMAL LOW (ref 150–400)

## 2020-07-27 LAB — GLUCOSE, CAPILLARY
Glucose-Capillary: 110 mg/dL — ABNORMAL HIGH (ref 70–99)
Glucose-Capillary: 141 mg/dL — ABNORMAL HIGH (ref 70–99)
Glucose-Capillary: 136 mg/dL — ABNORMAL HIGH (ref 70–99)
Glucose-Capillary: 132 mg/dL — ABNORMAL HIGH (ref 70–99)
Glucose-Capillary: 124 mg/dL — ABNORMAL HIGH (ref 70–99)
Glucose-Capillary: 120 mg/dL — ABNORMAL HIGH (ref 70–99)
Glucose-Capillary: 138 mg/dL — ABNORMAL HIGH (ref 70–99)
Glucose-Capillary: 145 mg/dL — ABNORMAL HIGH (ref 70–99)

## 2020-07-27 LAB — PREPARE RBC (CROSSMATCH)

## 2020-07-27 LAB — MAGNESIUM: Magnesium: 2.3 mg/dL (ref 1.7–2.4)

## 2020-07-27 LAB — APTT: aPTT: 38 seconds — ABNORMAL HIGH (ref 24–36)

## 2020-07-27 LAB — HEMOGLOBIN AND HEMATOCRIT, BLOOD
HCT: 21.6 % — ABNORMAL LOW (ref 36.0–46.0)
Hemoglobin: 7.2 g/dL — ABNORMAL LOW (ref 12.0–15.0)

## 2020-07-27 SURGERY — REPLACEMENT, AORTIC VALVE, OPEN
Anesthesia: General | Site: Chest

## 2020-07-27 MED ORDER — TRAMADOL HCL 50 MG PO TABS
50.0000 mg | ORAL_TABLET | ORAL | Status: DC | PRN
  Administered 2020-07-27: 100 mg via ORAL
  Administered 2020-07-27: 50 mg via ORAL
  Administered 2020-07-28: 100 mg via ORAL
  Filled 2020-07-27 (×2): qty 2
  Filled 2020-07-27: qty 1

## 2020-07-27 MED ORDER — SODIUM CHLORIDE 0.9% FLUSH
10.0000 mL | Freq: Two times a day (BID) | INTRAVENOUS | Status: DC
  Administered 2020-07-27: 10 mL
  Administered 2020-07-27: 20 mL
  Administered 2020-07-28 – 2020-07-29 (×3): 10 mL

## 2020-07-27 MED ORDER — SODIUM CHLORIDE 0.9 % IV SOLN: 250.0000 mL | INTRAVENOUS | Status: DC

## 2020-07-27 MED ORDER — THROMBIN (RECOMBINANT) 20000 UNITS EX SOLR
CUTANEOUS | Status: AC
  Filled 2020-07-27: qty 20000

## 2020-07-27 MED ORDER — MAGNESIUM SULFATE 4 GM/100ML IV SOLN
4.0000 g | Freq: Once | INTRAVENOUS | Status: AC
  Administered 2020-07-27: 4 g via INTRAVENOUS

## 2020-07-27 MED ORDER — ONDANSETRON HCL 4 MG/2ML IJ SOLN
4.0000 mg | Freq: Four times a day (QID) | INTRAMUSCULAR | Status: DC | PRN
  Administered 2020-07-28 – 2020-07-29 (×2): 4 mg via INTRAVENOUS
  Filled 2020-07-27 (×2): qty 2

## 2020-07-27 MED ORDER — SODIUM CHLORIDE (PF) 0.9 % IJ SOLN
INTRAMUSCULAR | Status: AC
  Filled 2020-07-27: qty 10

## 2020-07-27 MED ORDER — SODIUM CHLORIDE 0.9% FLUSH: 3.0000 mL | INTRAVENOUS | Status: DC | PRN

## 2020-07-27 MED ORDER — ALBUMIN HUMAN 5 % IV SOLN: INTRAVENOUS | Status: DC | PRN

## 2020-07-27 MED ORDER — METOPROLOL TARTRATE 12.5 MG HALF TABLET: 12.5000 mg | ORAL_TABLET | Freq: Two times a day (BID) | ORAL | Status: DC

## 2020-07-27 MED ORDER — LACTATED RINGERS IV SOLN
500.0000 mL | Freq: Once | INTRAVENOUS | Status: AC | PRN
  Administered 2020-07-27: 500 mL via INTRAVENOUS

## 2020-07-27 MED ORDER — SODIUM CHLORIDE 0.9 % IV SOLN: INTRAVENOUS | Status: DC | PRN

## 2020-07-27 MED ORDER — SODIUM CHLORIDE 0.9 % IV SOLN: INTRAVENOUS | Status: DC

## 2020-07-27 MED ORDER — PLASMA-LYTE 148 IV SOLN
INTRAVENOUS | Status: DC | PRN
  Administered 2020-07-27: 500 mL via INTRAVASCULAR

## 2020-07-27 MED ORDER — ACETAMINOPHEN 160 MG/5ML PO SOLN: 650.0000 mg | Freq: Once | ORAL | Status: AC

## 2020-07-27 MED ORDER — ACETAMINOPHEN 160 MG/5ML PO SOLN: 1000.0000 mg | Freq: Four times a day (QID) | ORAL | Status: DC

## 2020-07-27 MED ORDER — SODIUM CHLORIDE 0.9% FLUSH
3.0000 mL | Freq: Two times a day (BID) | INTRAVENOUS | Status: DC
  Administered 2020-07-28 (×2): 3 mL via INTRAVENOUS

## 2020-07-27 MED ORDER — METOCLOPRAMIDE HCL 5 MG/ML IJ SOLN
10.0000 mg | Freq: Four times a day (QID) | INTRAMUSCULAR | Status: AC
  Administered 2020-07-27 – 2020-07-28 (×4): 10 mg via INTRAVENOUS
  Filled 2020-07-27 (×4): qty 2

## 2020-07-27 MED ORDER — MIDAZOLAM HCL 5 MG/5ML IJ SOLN
INTRAMUSCULAR | Status: DC | PRN
  Administered 2020-07-27 (×2): 4 mg via INTRAVENOUS
  Administered 2020-07-27 (×3): 2 mg via INTRAVENOUS

## 2020-07-27 MED ORDER — HEPARIN SODIUM (PORCINE) 1000 UNIT/ML IJ SOLN
INTRAMUSCULAR | Status: DC | PRN
  Administered 2020-07-27: 31000 [IU] via INTRAVENOUS

## 2020-07-27 MED ORDER — SODIUM CHLORIDE 0.9% FLUSH: 10.0000 mL | INTRAVENOUS | Status: DC | PRN

## 2020-07-27 MED ORDER — POLYETHYL GLYCOL-PROPYL GLYCOL 0.4-0.3 % OP SOLN: 1.0000 [drp] | Freq: Three times a day (TID) | OPHTHALMIC | Status: DC | PRN

## 2020-07-27 MED ORDER — THROMBIN 20000 UNITS EX SOLR
OROMUCOSAL | Status: DC | PRN
  Administered 2020-07-27 (×3): 4 mL via TOPICAL

## 2020-07-27 MED ORDER — PHENYLEPHRINE 40 MCG/ML (10ML) SYRINGE FOR IV PUSH (FOR BLOOD PRESSURE SUPPORT)
PREFILLED_SYRINGE | INTRAVENOUS | Status: DC | PRN
  Administered 2020-07-27: 80 ug via INTRAVENOUS

## 2020-07-27 MED ORDER — RIMEGEPANT SULFATE 75 MG PO TBDP: 75.0000 mg | ORAL_TABLET | Freq: Every day | ORAL | Status: DC | PRN

## 2020-07-27 MED ORDER — DEXMEDETOMIDINE HCL IN NACL 400 MCG/100ML IV SOLN: 0.0000 ug/kg/h | INTRAVENOUS | Status: DC

## 2020-07-27 MED ORDER — INSULIN REGULAR(HUMAN) IN NACL 100-0.9 UT/100ML-% IV SOLN: INTRAVENOUS | Status: DC

## 2020-07-27 MED ORDER — ASPIRIN 81 MG PO CHEW: 324.0000 mg | CHEWABLE_TABLET | Freq: Every day | ORAL | Status: DC

## 2020-07-27 MED ORDER — SODIUM CHLORIDE 0.9% IV SOLUTION: Freq: Once | INTRAVENOUS | Status: AC

## 2020-07-27 MED ORDER — LEVOFLOXACIN IN D5W 750 MG/150ML IV SOLN
750.0000 mg | INTRAVENOUS | Status: AC
  Administered 2020-07-28: 750 mg via INTRAVENOUS
  Filled 2020-07-27: qty 150

## 2020-07-27 MED ORDER — PROPOFOL 10 MG/ML IV BOLUS
INTRAVENOUS | Status: AC
  Filled 2020-07-27: qty 20

## 2020-07-27 MED ORDER — NITROGLYCERIN IN D5W 200-5 MCG/ML-% IV SOLN: 0.0000 ug/min | INTRAVENOUS | Status: DC

## 2020-07-27 MED ORDER — PANTOPRAZOLE SODIUM 40 MG PO TBEC: 40.0000 mg | DELAYED_RELEASE_TABLET | Freq: Every day | ORAL | Status: DC

## 2020-07-27 MED ORDER — BISACODYL 10 MG RE SUPP: 10.0000 mg | Freq: Every day | RECTAL | Status: DC

## 2020-07-27 MED ORDER — ASPIRIN EC 325 MG PO TBEC
325.0000 mg | DELAYED_RELEASE_TABLET | Freq: Every day | ORAL | Status: DC
  Administered 2020-07-28 – 2020-07-29 (×2): 325 mg via ORAL
  Filled 2020-07-27 (×2): qty 1

## 2020-07-27 MED ORDER — POTASSIUM CHLORIDE 10 MEQ/50ML IV SOLN
10.0000 meq | INTRAVENOUS | Status: AC
  Administered 2020-07-27 (×3): 10 meq via INTRAVENOUS

## 2020-07-27 MED ORDER — FENTANYL CITRATE (PF) 250 MCG/5ML IJ SOLN
INTRAMUSCULAR | Status: AC
  Filled 2020-07-27: qty 25

## 2020-07-27 MED ORDER — METOPROLOL TARTRATE 25 MG/10 ML ORAL SUSPENSION: 12.5000 mg | Freq: Two times a day (BID) | ORAL | Status: DC

## 2020-07-27 MED ORDER — LACTATED RINGERS IV SOLN: INTRAVENOUS | Status: DC | PRN

## 2020-07-27 MED ORDER — ALBUMIN HUMAN 5 % IV SOLN
250.0000 mL | INTRAVENOUS | Status: AC | PRN
  Administered 2020-07-27: 12.5 g via INTRAVENOUS
  Filled 2020-07-27: qty 250

## 2020-07-27 MED ORDER — LACTATED RINGERS IV SOLN: INTRAVENOUS | Status: DC

## 2020-07-27 MED ORDER — HEPARIN SODIUM (PORCINE) 1000 UNIT/ML IJ SOLN
INTRAMUSCULAR | Status: AC
  Filled 2020-07-27: qty 1

## 2020-07-27 MED ORDER — CHLORHEXIDINE GLUCONATE 0.12% ORAL RINSE (MEDLINE KIT): 15.0000 mL | Freq: Two times a day (BID) | OROMUCOSAL | Status: DC

## 2020-07-27 MED ORDER — LIDOCAINE 2% (20 MG/ML) 5 ML SYRINGE
INTRAMUSCULAR | Status: AC
  Filled 2020-07-27: qty 5

## 2020-07-27 MED ORDER — CHLORHEXIDINE GLUCONATE CLOTH 2 % EX PADS: 6.0000 | MEDICATED_PAD | Freq: Every day | CUTANEOUS | Status: DC

## 2020-07-27 MED ORDER — STERILE WATER FOR IRRIGATION IR SOLN
Status: DC | PRN
  Administered 2020-07-27: 2000 mL

## 2020-07-27 MED ORDER — ROCURONIUM BROMIDE 10 MG/ML (PF) SYRINGE
PREFILLED_SYRINGE | INTRAVENOUS | Status: DC | PRN
  Administered 2020-07-27 (×4): 50 mg via INTRAVENOUS

## 2020-07-27 MED ORDER — PHENYLEPHRINE HCL-NACL 20-0.9 MG/250ML-% IV SOLN
0.0000 ug/min | INTRAVENOUS | Status: DC
  Filled 2020-07-27: qty 250

## 2020-07-27 MED ORDER — CHLORHEXIDINE GLUCONATE CLOTH 2 % EX PADS
6.0000 | MEDICATED_PAD | Freq: Every day | CUTANEOUS | Status: DC
  Administered 2020-07-27 – 2020-07-28 (×2): 6 via TOPICAL

## 2020-07-27 MED ORDER — BISACODYL 5 MG PO TBEC
10.0000 mg | DELAYED_RELEASE_TABLET | Freq: Every day | ORAL | Status: DC
  Administered 2020-07-28 – 2020-07-29 (×2): 10 mg via ORAL
  Filled 2020-07-27 (×2): qty 2

## 2020-07-27 MED ORDER — CHLORHEXIDINE GLUCONATE 0.12 % MT SOLN
15.0000 mL | Freq: Once | OROMUCOSAL | Status: AC
  Administered 2020-07-27: 15 mL via OROMUCOSAL
  Filled 2020-07-27: qty 15

## 2020-07-27 MED ORDER — SODIUM CHLORIDE 0.45 % IV SOLN: INTRAVENOUS | Status: DC | PRN

## 2020-07-27 MED ORDER — ACETAMINOPHEN 500 MG PO TABS
1000.0000 mg | ORAL_TABLET | Freq: Four times a day (QID) | ORAL | Status: DC
  Administered 2020-07-27 – 2020-07-29 (×6): 1000 mg via ORAL
  Filled 2020-07-27 (×6): qty 2

## 2020-07-27 MED ORDER — PANTOPRAZOLE SODIUM 40 MG PO TBEC
40.0000 mg | DELAYED_RELEASE_TABLET | Freq: Every day | ORAL | Status: DC
  Administered 2020-07-28 – 2020-07-29 (×2): 40 mg via ORAL
  Filled 2020-07-27 (×2): qty 1

## 2020-07-27 MED ORDER — 0.9 % SODIUM CHLORIDE (POUR BTL) OPTIME
TOPICAL | Status: DC | PRN
  Administered 2020-07-27: 5000 mL

## 2020-07-27 MED ORDER — MIDAZOLAM HCL (PF) 10 MG/2ML IJ SOLN
INTRAMUSCULAR | Status: AC
  Filled 2020-07-27: qty 2

## 2020-07-27 MED ORDER — MIDAZOLAM HCL 2 MG/2ML IJ SOLN: 2.0000 mg | INTRAMUSCULAR | Status: DC | PRN

## 2020-07-27 MED ORDER — CHLORHEXIDINE GLUCONATE 0.12 % MT SOLN
15.0000 mL | OROMUCOSAL | Status: AC
  Administered 2020-07-27: 15 mL via OROMUCOSAL

## 2020-07-27 MED ORDER — MIDAZOLAM HCL 2 MG/2ML IJ SOLN
INTRAMUSCULAR | Status: AC
  Filled 2020-07-27: qty 4

## 2020-07-27 MED ORDER — PROPOFOL 10 MG/ML IV BOLUS
INTRAVENOUS | Status: DC | PRN
  Administered 2020-07-27: 150 mg via INTRAVENOUS

## 2020-07-27 MED ORDER — ROCURONIUM BROMIDE 10 MG/ML (PF) SYRINGE
PREFILLED_SYRINGE | INTRAVENOUS | Status: AC
  Filled 2020-07-27: qty 10

## 2020-07-27 MED ORDER — EPHEDRINE SULFATE-NACL 50-0.9 MG/10ML-% IV SOSY
PREFILLED_SYRINGE | INTRAVENOUS | Status: DC | PRN
  Administered 2020-07-27: 5 mg via INTRAVENOUS
  Administered 2020-07-27: 10 mg via INTRAVENOUS

## 2020-07-27 MED ORDER — METOPROLOL TARTRATE 12.5 MG HALF TABLET
12.5000 mg | ORAL_TABLET | Freq: Once | ORAL | Status: AC
  Administered 2020-07-27: 12.5 mg via ORAL
  Filled 2020-07-27: qty 1

## 2020-07-27 MED ORDER — ACETAMINOPHEN 650 MG RE SUPP
650.0000 mg | Freq: Once | RECTAL | Status: AC
  Administered 2020-07-27: 650 mg via RECTAL

## 2020-07-27 MED ORDER — OXYCODONE HCL 5 MG PO TABS
5.0000 mg | ORAL_TABLET | ORAL | Status: DC | PRN
  Administered 2020-07-27 – 2020-07-28 (×2): 10 mg via ORAL
  Administered 2020-07-29: 5 mg via ORAL
  Filled 2020-07-27 (×2): qty 2
  Filled 2020-07-27: qty 1

## 2020-07-27 MED ORDER — MORPHINE SULFATE (PF) 2 MG/ML IV SOLN
1.0000 mg | INTRAVENOUS | Status: DC | PRN
  Administered 2020-07-27: 2 mg via INTRAVENOUS
  Filled 2020-07-27: qty 1

## 2020-07-27 MED ORDER — VANCOMYCIN HCL IN DEXTROSE 1-5 GM/200ML-% IV SOLN
1000.0000 mg | Freq: Once | INTRAVENOUS | Status: AC
  Administered 2020-07-27: 1000 mg via INTRAVENOUS
  Filled 2020-07-27: qty 200

## 2020-07-27 MED ORDER — DOCUSATE SODIUM 100 MG PO CAPS
200.0000 mg | ORAL_CAPSULE | Freq: Every day | ORAL | Status: DC
  Administered 2020-07-28 – 2020-07-29 (×2): 200 mg via ORAL
  Filled 2020-07-27 (×2): qty 2

## 2020-07-27 MED ORDER — METOPROLOL TARTRATE 5 MG/5ML IV SOLN: 2.5000 mg | INTRAVENOUS | Status: DC | PRN

## 2020-07-27 MED ORDER — ORAL CARE MOUTH RINSE
15.0000 mL | OROMUCOSAL | Status: DC
  Administered 2020-07-27 (×2): 15 mL via OROMUCOSAL

## 2020-07-27 MED ORDER — POLYVINYL ALCOHOL 1.4 % OP SOLN
1.0000 [drp] | Freq: Three times a day (TID) | OPHTHALMIC | Status: DC | PRN
  Filled 2020-07-27: qty 15

## 2020-07-27 MED ORDER — POTASSIUM CHLORIDE 10 MEQ/50ML IV SOLN
10.0000 meq | INTRAVENOUS | Status: AC
  Administered 2020-07-27 – 2020-07-28 (×3): 10 meq via INTRAVENOUS
  Filled 2020-07-27: qty 50

## 2020-07-27 MED ORDER — PROTAMINE SULFATE 10 MG/ML IV SOLN
INTRAVENOUS | Status: DC | PRN
  Administered 2020-07-27: 310 mg via INTRAVENOUS

## 2020-07-27 MED ORDER — FENTANYL CITRATE (PF) 250 MCG/5ML IJ SOLN
INTRAMUSCULAR | Status: DC | PRN
Start: 2020-07-27 — End: 2020-07-27
  Administered 2020-07-27: 50 ug via INTRAVENOUS
  Administered 2020-07-27: 250 ug via INTRAVENOUS
  Administered 2020-07-27: 50 ug via INTRAVENOUS
  Administered 2020-07-27: 250 ug via INTRAVENOUS
  Administered 2020-07-27: 500 ug via INTRAVENOUS
  Administered 2020-07-27: 150 ug via INTRAVENOUS

## 2020-07-27 MED ORDER — DEXTROSE 50 % IV SOLN: 0.0000 mL | INTRAVENOUS | Status: DC | PRN

## 2020-07-27 MED ORDER — FAMOTIDINE IN NACL 20-0.9 MG/50ML-% IV SOLN
20.0000 mg | Freq: Two times a day (BID) | INTRAVENOUS | Status: AC
  Administered 2020-07-27 (×2): 20 mg via INTRAVENOUS
  Filled 2020-07-27: qty 50

## 2020-07-27 MED ORDER — HEMOSTATIC AGENTS (NO CHARGE) OPTIME
TOPICAL | Status: DC | PRN
  Administered 2020-07-27: 1 via TOPICAL

## 2020-07-27 MED ORDER — CHLORHEXIDINE GLUCONATE 4 % EX LIQD: 30.0000 mL | CUTANEOUS | Status: DC

## 2020-07-27 MED FILL — Heparin Sodium (Porcine) Inj 1000 Unit/ML: INTRAMUSCULAR | Qty: 10 | Status: AC

## 2020-07-27 MED FILL — Heparin Sodium (Porcine) Inj 1000 Unit/ML: INTRAMUSCULAR | Qty: 2500 | Status: AC

## 2020-07-27 MED FILL — Electrolyte-R (PH 7.4) Solution: INTRAVENOUS | Qty: 4000 | Status: AC

## 2020-07-27 MED FILL — Sodium Chloride IV Soln 0.9%: INTRAVENOUS | Qty: 3000 | Status: AC

## 2020-07-27 MED FILL — Mannitol IV Soln 20%: INTRAVENOUS | Qty: 500 | Status: AC

## 2020-07-27 MED FILL — Heparin Sodium (Porcine) Inj 1000 Unit/ML: INTRAMUSCULAR | Qty: 30 | Status: AC

## 2020-07-27 MED FILL — Magnesium Sulfate Inj 50%: INTRAMUSCULAR | Qty: 10 | Status: AC

## 2020-07-27 MED FILL — Potassium Chloride Inj 2 mEq/ML: INTRAVENOUS | Qty: 40 | Status: AC

## 2020-07-27 MED FILL — Lidocaine HCl Local Soln Prefilled Syringe 100 MG/5ML (2%): INTRAMUSCULAR | Qty: 5 | Status: AC

## 2020-07-27 MED FILL — Sodium Bicarbonate IV Soln 8.4%: INTRAVENOUS | Qty: 50 | Status: AC

## 2020-07-27 SURGICAL SUPPLY — 73 items
ADAPTER CARDIO PERF ANTE/RETRO (ADAPTER) ×3 IMPLANT
BAG DECANTER FOR FLEXI CONT (MISCELLANEOUS) ×3 IMPLANT
BLADE CLIPPER SURG (BLADE) IMPLANT
BLADE STERNUM SYSTEM 6 (BLADE) ×3 IMPLANT
BLADE SURG 15 STRL LF DISP TIS (BLADE) ×2 IMPLANT
BLADE SURG 15 STRL SS (BLADE) ×1
CANISTER SUCT 3000ML PPV (MISCELLANEOUS) ×3 IMPLANT
CANNULA GUNDRY RCSP 15FR (MISCELLANEOUS) ×3 IMPLANT
CATH HEART VENT LEFT (CATHETERS) ×2 IMPLANT
CATH ROBINSON RED A/P 18FR (CATHETERS) ×9 IMPLANT
CATH THORACIC 36FR (CATHETERS) ×3 IMPLANT
CATH THORACIC 36FR RT ANG (CATHETERS) ×3 IMPLANT
CNTNR URN SCR LID CUP LEK RST (MISCELLANEOUS) ×2 IMPLANT
CONT SPEC 4OZ STRL OR WHT (MISCELLANEOUS) ×1
COVER SURGICAL LIGHT HANDLE (MISCELLANEOUS) IMPLANT
DRAPE CARDIOVASCULAR INCISE (DRAPES) ×1
DRAPE SLUSH/WARMER DISC (DRAPES) ×3 IMPLANT
DRAPE SRG 135X102X78XABS (DRAPES) ×2 IMPLANT
DRSG COVADERM 4X14 (GAUZE/BANDAGES/DRESSINGS) ×3 IMPLANT
ELECT CAUTERY BLADE 6.4 (BLADE) ×3 IMPLANT
ELECT REM PT RETURN 9FT ADLT (ELECTROSURGICAL) ×6
ELECTRODE REM PT RTRN 9FT ADLT (ELECTROSURGICAL) ×4 IMPLANT
FELT TEFLON 1X6 (MISCELLANEOUS) ×6 IMPLANT
GAUZE SPONGE 4X4 12PLY STRL (GAUZE/BANDAGES/DRESSINGS) ×3 IMPLANT
GAUZE SPONGE 4X4 12PLY STRL LF (GAUZE/BANDAGES/DRESSINGS) ×3 IMPLANT
GLOVE BIO SURGEON STRL SZ 6 (GLOVE) ×6 IMPLANT
GLOVE BIO SURGEON STRL SZ 6.5 (GLOVE) ×9 IMPLANT
GLOVE BIO SURGEON STRL SZ7 (GLOVE) IMPLANT
GLOVE BIO SURGEON STRL SZ7.5 (GLOVE) IMPLANT
GLOVE BIOGEL PI IND STRL 6.5 (GLOVE) ×8 IMPLANT
GLOVE BIOGEL PI IND STRL 9 (GLOVE) ×2 IMPLANT
GLOVE BIOGEL PI INDICATOR 6.5 (GLOVE) ×4
GLOVE BIOGEL PI INDICATOR 9 (GLOVE) ×1
GLOVE TRIUMPH SURG SIZE 7.0 (KITS) ×6 IMPLANT
GOWN STRL REUS W/ TWL LRG LVL3 (GOWN DISPOSABLE) ×14 IMPLANT
GOWN STRL REUS W/ TWL XL LVL3 (GOWN DISPOSABLE) ×2 IMPLANT
GOWN STRL REUS W/TWL LRG LVL3 (GOWN DISPOSABLE) ×7
GOWN STRL REUS W/TWL XL LVL3 (GOWN DISPOSABLE) ×1
HEMOSTAT POWDER SURGIFOAM 1G (HEMOSTASIS) ×9 IMPLANT
HEMOSTAT SURGICEL 2X14 (HEMOSTASIS) ×3 IMPLANT
KIT BASIN OR (CUSTOM PROCEDURE TRAY) ×3 IMPLANT
KIT CATH CPB BARTLE (MISCELLANEOUS) ×3 IMPLANT
KIT SUCTION CATH 14FR (SUCTIONS) ×3 IMPLANT
KIT TURNOVER KIT B (KITS) ×3 IMPLANT
LINE VENT (MISCELLANEOUS) ×3 IMPLANT
NS IRRIG 1000ML POUR BTL (IV SOLUTION) ×15 IMPLANT
PACK E OPEN HEART (SUTURE) ×3 IMPLANT
PACK OPEN HEART (CUSTOM PROCEDURE TRAY) ×3 IMPLANT
PAD ARMBOARD 7.5X6 YLW CONV (MISCELLANEOUS) IMPLANT
POSITIONER HEAD DONUT 9IN (MISCELLANEOUS) ×3 IMPLANT
SET CARDIOPLEGIA MPS 5001102 (MISCELLANEOUS) ×3 IMPLANT
SUT BONE WAX W31G (SUTURE) ×3 IMPLANT
SUT ETHIBON 2 0 V 52N 30 (SUTURE) ×6 IMPLANT
SUT ETHIBON EXCEL 2-0 V-5 (SUTURE) ×9 IMPLANT
SUT ETHIBOND V-5 VALVE (SUTURE) ×3 IMPLANT
SUT PROLENE 3 0 SH DA (SUTURE) IMPLANT
SUT PROLENE 3 0 SH1 36 (SUTURE) ×3 IMPLANT
SUT PROLENE 4 0 RB 1 (SUTURE) ×3
SUT PROLENE 4-0 RB1 .5 CRCL 36 (SUTURE) ×6 IMPLANT
SUT STEEL 6MS V (SUTURE) IMPLANT
SUT STEEL SZ 6 DBL 3X14 BALL (SUTURE) ×9 IMPLANT
SUT VIC AB 1 CTX 36 (SUTURE) ×2
SUT VIC AB 1 CTX36XBRD ANBCTR (SUTURE) ×4 IMPLANT
SYSTEM SAHARA CHEST DRAIN ATS (WOUND CARE) ×3 IMPLANT
TAPE CLOTH SURG 4X10 WHT LF (GAUZE/BANDAGES/DRESSINGS) ×3 IMPLANT
TAPE PAPER 2X10 WHT MICROPORE (GAUZE/BANDAGES/DRESSINGS) ×3 IMPLANT
TOWEL GREEN STERILE (TOWEL DISPOSABLE) ×3 IMPLANT
TOWEL GREEN STERILE FF (TOWEL DISPOSABLE) ×3 IMPLANT
TRAY FOLEY SLVR 16FR TEMP STAT (SET/KITS/TRAYS/PACK) ×3 IMPLANT
UNDERPAD 30X36 HEAVY ABSORB (UNDERPADS AND DIAPERS) ×3 IMPLANT
VALVE AORTIC SZ23 INSP/RESIL (Prosthesis & Implant Heart) ×3 IMPLANT
VENT LEFT HEART 12002 (CATHETERS) ×3
WATER STERILE IRR 1000ML POUR (IV SOLUTION) ×6 IMPLANT

## 2020-07-27 NOTE — Anesthesia Procedure Notes (Signed)
Central Venous Catheter Insertion Performed by: Kipp Brood, MD, anesthesiologist Start/End10/21/2021 6:55 AM, 07/27/2020 7:05 AM Patient location: Pre-op. Preanesthetic checklist: patient identified, IV checked, site marked, risks and benefits discussed, surgical consent, monitors and equipment checked, pre-op evaluation, timeout performed and anesthesia consent Lidocaine 1% used for infiltration and patient sedated Hand hygiene performed  and maximum sterile barriers used  Catheter size: 8.5 Fr Sheath introducer Procedure performed using ultrasound guided technique. Ultrasound Notes:anatomy identified, needle tip was noted to be adjacent to the nerve/plexus identified, no ultrasound evidence of intravascular and/or intraneural injection and image(s) printed for medical record Attempts: 1 Following insertion, line sutured and dressing applied. Post procedure assessment: blood return through all ports, free fluid flow and no air  Patient tolerated the procedure well with no immediate complications.

## 2020-07-27 NOTE — Anesthesia Postprocedure Evaluation (Signed)
Anesthesia Post Note  Patient: Rebecca Farley  Procedure(s) Performed: AORTIC VALVE REPLACEMENT (AVR) USING 23 MM INSPIRIS RESILIA  AORTIC VALVE (N/A Chest) TRANSESOPHAGEAL ECHOCARDIOGRAM (TEE) (N/A )     Patient location during evaluation: SICU Anesthesia Type: General Level of consciousness: sedated and patient remains intubated per anesthesia plan Pain management: pain level controlled Vital Signs Assessment: post-procedure vital signs reviewed and stable Respiratory status: patient remains intubated per anesthesia plan and patient on ventilator - see flowsheet for VS Cardiovascular status: stable Postop Assessment: no apparent nausea or vomiting Anesthetic complications: no   No complications documented.  Last Vitals:  Vitals:   07/27/20 1600 07/27/20 1615  BP: 106/61   Pulse: 80 80  Resp: 16 15  Temp: 36.7 C 36.6 C  SpO2: 100% 98%    Last Pain:  Vitals:   07/27/20 1215  TempSrc: Core  PainSc:                  Estel Scholze COKER

## 2020-07-27 NOTE — Op Note (Signed)
CARDIOVASCULAR SURGERY OPERATIVE NOTE  07/27/2020 [name] [id number]  Surgeon:  Alleen Borne, MD  First Assistant: Lowella Dandy, PA-C   Preoperative Diagnosis:  Severe aortic stenosis   Postoperative Diagnosis:  Same   Procedure:  1. Median Sternotomy 2. Extracorporeal circulation 3.   Aortic valve replacement using a 23 mm Edwards INSPIRIS RESILIA valve.  Anesthesia:  General Endotracheal   Clinical History/Surgical Indication:  This 64 year old woman has stage D, critical, symptomatic aortic stenosis with New York Heart Association class III symptoms of exertional shortness of breath and fatigue consistent with chronic diastolic congestive heart failure. I have personally reviewed her 2D echocardiogram, cardiac catheterization, and CTA studies. Her echocardiogram shows a severely calcified aortic valve that looks functionally bicuspid with fusion of the left and right cusps and restricted leaflet mobility. The mean gradient is 62 mmHg with a valve area of 0.8 cm consistent with critical aortic stenosis. Cardiac catheterization shows nonobstructive coronary disease. Her gated cardiac CTA shows a functionally bicuspid aortic valve with fusion of the left and right cusps. Given her age of 58, bicuspid aortic valve disease, and low surgical risk I think the best option for her is open surgical aortic valve replacement. This will significantly decrease the risk of structural valve deterioration and paravalvular leak. I discussed the case with Dr. Excell Seltzer with the multidisciplinary heart valve team and we are both in agreement with proceeding with open surgical aortic valve replacement.  The patient and her daughter were counseled at length regarding treatment alternatives for management of severe symptomatic aortic stenosis. Alternative approaches such as conventional aortic valve replacement, transcatheter aortic valve replacement, and palliative medical therapy were compared and  contrasted at length. The risks associated with conventional surgical aortic valve replacement were been discussed in detail, as were expectations for post-operative convalescence. Long-term prognosis with medical therapy was discussed. She is in agreement with proceeding with open surgical aortic valve replacement.  I discussed the operative procedure with the patient and her daughter including benefits and risks; including but not limited to bleeding, blood transfusion, infection, stroke, myocardial infarction, heart block requiring a permanent pacemaker, organ dysfunction, and death. I also discussed the longer-term risks of structural valve deterioration and prosthetic valve endocarditis. [name] understands and agrees to proceed.  Preparation:  The patient was seen in the preoperative holding area and the correct patient, correct operation were confirmed with the patient after reviewing the medical record and catheterization. The consent was signed by me. Preoperative antibiotics were given. A pulmonary arterial line and radial arterial line were placed by the anesthesia team. The patient was taken back to the operating room and positioned supine on the operating room table. After being placed under general endotracheal anesthesia by the anesthesia team a foley catheter was placed. The neck, chest, abdomen, and both legs were prepped with betadine soap and solution and draped in the usual sterile manner. A surgical time-out was taken and the correct patient and operative procedure were confirmed with the nursing and anesthesia staff.   Pre-bypass TEE:   Complete TEE assessment was performed by Dr. Noreene Larsson. This showed severe bicuspid aortic valve stenosis with normal LV systolic function.    Post-bypass TEE:   Normal functioning prosthetic aortic valve with no perivalvular leak or regurgitation through the valve. Mean gradient 9 mm Hg. Left ventricular function preserved with EF 65-70%. No  mitral regurgitation.    Cardiopulmonary Bypass:  A median sternotomy was performed. The pericardium was opened in the midline. Right  ventricular function appeared normal. The ascending aorta was of normal size and had no palpable plaque. There were no contraindications to aortic cannulation or cross-clamping. The patient was fully systemically heparinized and the ACT was maintained > 400 sec. The proximal aortic arch was cannulated with a 20 F aortic cannula for arterial inflow. Venous cannulation was performed via the right atrial appendage using a two-staged venous cannula. An antegrade cardioplegia/vent cannula was inserted into the mid-ascending aorta. A left ventricular vent was placed via the right superior pulmonary vein. A retrograde cardioplegia cannnula was placed into the coronary sinus via the right atrium. Aortic occlusion was performed with a single cross-clamp. Systemic cooling to 32 degrees Centigrade and topical cooling of the heart with iced saline were used. Hyperkalemic antegrade cold blood cardioplegia was used to induce diastolic arrest and then cold blood retrograde cardioplegia was given at about 20 minute intervals throughout the period of arrest to maintain myocardial temperature at or below 10 degrees centigrade. A temperature probe was inserted into the interventricular septum and an insulating pad was placed in the pericardium. Carbon dioxide was insufflated into the pericardium at 5L/min throughout the procedure to minimize intracardiac air.   Aortic Valve Replacement:   A transverse aortotomy was performed 1 cm above the take-off of the right coronary artery. The native valve was a type 1 bicuspid with fusion of the right and left cusps, single raphe. There were calcified leaflets and moderate annular calcification. The ostia of the coronary arteries were in normal position and were not obstructed. The native valve leaflets were excised and the annulus was decalcified with  rongeurs. Care was taken to remove all particulate debris. The left ventricle was directly inspected for debris and then irrigated with ice saline solution. The annulus was sized and a size 23 mm  Edwards INSPIRIS RESILIA pericardial valve was chosen. The model number was [id number] and the serial number was [id number]. While the valve was being prepared 2-0 Ethibond pledgeted horizontal mattress sutures were placed around the annulus with the pledgets in a sub-annular position. The sutures were placed through the sewing ring and the valve lowered into place. The sutures were tied sequentially. The valve seated nicely and the coronary ostia were not obstructed. The prosthetic valve leaflets moved normally and there was no sub-valvular obstruction. The aortotomy was closed using 4-0 Prolene suture in 2 layers with felt strips to reinforce the closure.  Completion:  The patient was rewarmed to 37 degrees Centigrade. De-airing maneuvers were performed and the head placed in trendelenburg position. The crossclamp was removed with a time of 87 minutes. There was spontaneous return of sinus rhythm. The aortotomy was checked for hemostasis. Two temporary epicardial pacing wires were placed on the right atrium and two on the right ventricle. The left ventricular vent and retrograde cardioplegia cannulas were removed. The patient was weaned from CPB without difficulty on no inotropes. CPB time was 111 minutes. Cardiac output was 5 LPM. Heparin was fully reversed with protamine and the aortic and venous cannulas removed. Hemostasis was achieved. Mediastinal drainage tubes were placed. The sternum was closed with double #6 stainless steel wires. The fascia was closed with continuous # 1 vicryl suture. The subcutaneous tissue was closed with 2-0 vicryl continuous suture. The skin was closed with 3-0 vicryl subcuticular suture. All sponge, needle, and instrument counts were reported correct at the end of the case. Dry sterile  dressings were placed over the incisions and around the chest tubes which were connected to pleurevac  suction. The patient was then transported to the surgical intensive care unit in stable condition.

## 2020-07-27 NOTE — Anesthesia Preprocedure Evaluation (Addendum)
Anesthesia Evaluation  Patient identified by MRN, date of birth, ID band Patient awake    Reviewed: Allergy & Precautions, NPO status , Patient's Chart, lab work & pertinent test results  Airway Mallampati: II  TM Distance: >3 FB Neck ROM: Full    Dental  (+) Upper Dentures, Dental Advisory Given, Teeth Intact   Pulmonary    breath sounds clear to auscultation       Cardiovascular hypertension,  Rhythm:Regular Rate:Normal + Systolic murmurs    Neuro/Psych    GI/Hepatic hiatal hernia, GERD  ,  Endo/Other    Renal/GU      Musculoskeletal   Abdominal   Peds  Hematology   Anesthesia Other Findings   Reproductive/Obstetrics                            Anesthesia Physical Anesthesia Plan  ASA: III  Anesthesia Plan: General   Post-op Pain Management:    Induction: Intravenous  PONV Risk Score and Plan: Ondansetron and Dexamethasone  Airway Management Planned: Oral ETT  Additional Equipment: Arterial line, PA Cath, 3D TEE and Ultrasound Guidance Line Placement  Intra-op Plan:   Post-operative Plan: Post-operative intubation/ventilation  Informed Consent: I have reviewed the patients History and Physical, chart, labs and discussed the procedure including the risks, benefits and alternatives for the proposed anesthesia with the patient or authorized representative who has indicated his/her understanding and acceptance.     Dental advisory given  Plan Discussed with: CRNA and Anesthesiologist  Anesthesia Plan Comments:         Anesthesia Quick Evaluation

## 2020-07-27 NOTE — Brief Op Note (Signed)
07/27/2020  7:10 AM  PATIENT:  Rebecca Farley  64 y.o. female  PRE-OPERATIVE DIAGNOSIS:  SEVERE AS  POST-OPERATIVE DIAGNOSIS:  SEVERE AS  PROCEDURE:  Procedure(s):  AORTIC VALVE REPLACEMENT -23 mm [employer] Resilia Inspiris  TRANSESOPHAGEAL ECHOCARDIOGRAM (TEE) (N/A)  SURGEON:  Surgeon(s) and Role:    * Bartle, Payton Doughty, MD - Primary  PHYSICIAN ASSISTANT: Willim Turnage PA-C  ANESTHESIA:   general  EBL:  600 mL   BLOOD ADMINISTERED:  CELLSAVER  DRAINS: Mediastinal Chest Drains   LOCAL MEDICATIONS USED:  NONE  SPECIMEN:  Source of Specimen:  Aortic Valve Leaflets  DISPOSITION OF SPECIMEN:  PATHOLOGY  COUNTS:  YES  TOURNIQUET:  * No tourniquets in log *  DICTATION: .Dragon Dictation  PLAN OF CARE: Admit to inpatient   PATIENT DISPOSITION:  ICU - intubated and hemodynamically stable.   Delay start of Pharmacological VTE agent (>24hrs) due to surgical blood loss or risk of bleeding: yes

## 2020-07-27 NOTE — Anesthesia Procedure Notes (Signed)
Arterial Line Insertion Start/End10/21/2021 6:45 AM, 07/27/2020 7:05 AM Performed by: Jodell Cipro, CRNA, CRNA  Patient location: Pre-op. Preanesthetic checklist: patient identified, IV checked, site marked, risks and benefits discussed, surgical consent, monitors and equipment checked, pre-op evaluation, timeout performed and anesthesia consent Lidocaine 1% used for infiltration and patient sedated Left, radial was placed Catheter size: 20 G Hand hygiene performed , maximum sterile barriers used  and Seldinger technique used Waterhouse's test indicative of satisfactory collateral circulation Attempts: 1 Procedure performed without using ultrasound guided technique. Following insertion, dressing applied and Biopatch. Post procedure assessment: normal  Patient tolerated the procedure well with no immediate complications. Additional procedure comments: Localized swelling to area (nickel sized) during insertion. Marland Kitchen

## 2020-07-27 NOTE — Transfer of Care (Signed)
Immediate Anesthesia Transfer of Care Note  Patient: Rebecca Farley  Procedure(s) Performed: AORTIC VALVE REPLACEMENT (AVR) USING 23 MM INSPIRIS RESILIA  AORTIC VALVE (N/A Chest) TRANSESOPHAGEAL ECHOCARDIOGRAM (TEE) (N/A )  Patient Location: SICU  Anesthesia Type:General  Level of Consciousness: sedated and Patient remains intubated per anesthesia plan  Airway & Oxygen Therapy: Patient remains intubated per anesthesia plan and Patient placed on Ventilator (see vital sign flow sheet for setting)  Post-op Assessment: Report given to RN and Post -op Vital signs reviewed and stable  Post vital signs: Reviewed and stable  Last Vitals:  Vitals Value Taken Time  BP    Temp 35 C 07/27/20 1213  Pulse 80 07/27/20 1213  Resp 13 07/27/20 1213  SpO2 100 % 07/27/20 1213  Vitals shown include unvalidated device data.  Last Pain:  Vitals:   07/27/20 0615  PainSc: 0-No pain         Complications: No complications documented.

## 2020-07-27 NOTE — Interval H&P Note (Signed)
History and Physical Interval Note:  07/27/2020 7:06 AM  Rebecca Farley  has presented today for surgery, with the diagnosis of SEVERE AS.  The various methods of treatment have been discussed with the patient and family. After consideration of risks, benefits and other options for treatment, the patient has consented to  Procedure(s): AORTIC VALVE REPLACEMENT (AVR) (N/A) TRANSESOPHAGEAL ECHOCARDIOGRAM (TEE) (N/A) as a surgical intervention.  The patient's history has been reviewed, patient examined, no change in status, stable for surgery.  I have reviewed the patient's chart and labs.  Questions were answered to the patient's satisfaction.     Alleen Borne

## 2020-07-27 NOTE — Progress Notes (Signed)
Patient ID: Rebecca Farley, female   DOB: 01-Sep-1956, 64 y.o.   MRN: [id number]  TCTS Evening Rounds:   Hemodynamically stable  CI = 2.6  Extubated, dangled  Urine output good  CT output low  CBC    Component Value Date/Time   WBC 14.7 (H) 07/27/2020 1203   RBC 2.52 (L) 07/27/2020 1203   HGB 7.8 (L) 07/27/2020 1557   HGB 12.7 06/15/2020 1502   HCT 23.0 (L) 07/27/2020 1557   HCT 36.8 06/15/2020 1502   PLT 105 (L) 07/27/2020 1203   PLT 241 06/15/2020 1502   MCV 94.4 07/27/2020 1203   MCV 90 06/15/2020 1502   MCH 30.6 07/27/2020 1203   MCHC 32.4 07/27/2020 1203   RDW 13.5 07/27/2020 1203   RDW 13.7 06/15/2020 1502   LYMPHSABS 2.7 11/19/2019 1200   MONOABS 0.7 11/19/2019 1200   EOSABS 0.2 11/19/2019 1200   BASOSABS 0.1 11/19/2019 1200     BMET    Component Value Date/Time   NA 141 07/27/2020 1557   NA 139 06/15/2020 1502   K 4.0 07/27/2020 1557   CL 100 07/27/2020 1102   CO2 24 07/25/2020 1144   GLUCOSE 130 (H) 07/27/2020 1102   BUN 16 07/27/2020 1102   BUN 16 06/15/2020 1502   CREATININE 0.40 (L) 07/27/2020 1102   CALCIUM 10.3 07/25/2020 1144   GFRNONAA >60 07/25/2020 1144   GFRAA 89 06/15/2020 1502     A/P:  Stable postop course. Continue current plans. Postop Hgb 7.7 by lab and 7.8 istat. She has received albumin and RL on top of that so will give her a unit of PRBC's.

## 2020-07-27 NOTE — Anesthesia Procedure Notes (Signed)
Procedure Name: Intubation Date/Time: 07/27/2020 7:51 AM Performed by: Leonor Liv, CRNA Pre-anesthesia Checklist: Patient identified, Emergency Drugs available, Suction available and Patient being monitored Patient Re-evaluated:Patient Re-evaluated prior to induction Oxygen Delivery Method: Circle System Utilized Preoxygenation: Pre-oxygenation with 100% oxygen Induction Type: IV induction Ventilation: Mask ventilation without difficulty Laryngoscope Size: Mac and 3 Grade View: Grade I Tube type: Oral Tube size: 8.0 mm Number of attempts: 1 Airway Equipment and Method: Stylet and Oral airway Placement Confirmation: ETT inserted through vocal cords under direct vision,  positive ETCO2 and breath sounds checked- equal and bilateral Secured at: 21 cm Tube secured with: Tape Dental Injury: Teeth and Oropharynx as per pre-operative assessment

## 2020-07-27 NOTE — Procedures (Signed)
Extubation Procedure Note  Patient Details:   Name: Rebecca Farley DOB: [birth year] MRN: [id number]   Airway Documentation:    Vent end date: 07/27/20 Vent end time: 1605   Evaluation  O2 sats: stable throughout Complications: No apparent complications Patient did tolerate procedure well. Bilateral Breath Sounds: Clear   Yes   Patient was extubated per cardiac wean protocol. Patient had a positive cuff leak. VC 1.3 and -25 NIF. Patient was placed on 2 LPM nasal cannula. Strong productive cough and was able to speak well. No stridor noted. RN at bedside with IS. RT will continue to monitor. Vitals stable.    Chaos Carlile M 07/27/2020, 4:27 PM

## 2020-07-28 ENCOUNTER — Other Ambulatory Visit: Payer: Self-pay | Admitting: Cardiology

## 2020-07-28 ENCOUNTER — Encounter (HOSPITAL_COMMUNITY): Payer: Self-pay | Admitting: Surgery

## 2020-07-28 ENCOUNTER — Inpatient Hospital Stay (HOSPITAL_COMMUNITY): Payer: Medicare Other

## 2020-07-28 DIAGNOSIS — I350 Nonrheumatic aortic (valve) stenosis: Secondary | ICD-10-CM

## 2020-07-28 LAB — PREPARE RBC (CROSSMATCH)

## 2020-07-28 LAB — CBC
HCT: 27 % — ABNORMAL LOW (ref 36.0–46.0)
HCT: 27.7 % — ABNORMAL LOW (ref 36.0–46.0)
Hemoglobin: 8.8 g/dL — ABNORMAL LOW (ref 12.0–15.0)
Hemoglobin: 9.2 g/dL — ABNORMAL LOW (ref 12.0–15.0)
MCH: 30.1 pg (ref 26.0–34.0)
MCH: 30.7 pg (ref 26.0–34.0)
MCHC: 32.6 g/dL (ref 30.0–36.0)
MCHC: 33.2 g/dL (ref 30.0–36.0)
MCV: 92.5 fL (ref 80.0–100.0)
MCV: 92.3 fL (ref 80.0–100.0)
Platelets: 113 10*3/uL — ABNORMAL LOW (ref 150–400)
Platelets: 103 10*3/uL — ABNORMAL LOW (ref 150–400)
RBC: 2.92 MIL/uL — ABNORMAL LOW (ref 3.87–5.11)
RBC: 3 MIL/uL — ABNORMAL LOW (ref 3.87–5.11)
RDW: 13.8 % (ref 11.5–15.5)
RDW: 13.8 % (ref 11.5–15.5)
WBC: 13.1 10*3/uL — ABNORMAL HIGH (ref 4.0–10.5)
WBC: 14.2 10*3/uL — ABNORMAL HIGH (ref 4.0–10.5)
nRBC: 0 % (ref 0.0–0.2)
nRBC: 0 % (ref 0.0–0.2)

## 2020-07-28 LAB — BASIC METABOLIC PANEL
Anion gap: 6 (ref 5–15)
Anion gap: 8 (ref 5–15)
BUN: 9 mg/dL (ref 8–23)
BUN: 10 mg/dL (ref 8–23)
CO2: 22 mmol/L (ref 22–32)
CO2: 25 mmol/L (ref 22–32)
Calcium: 7.7 mg/dL — ABNORMAL LOW (ref 8.9–10.3)
Calcium: 7.9 mg/dL — ABNORMAL LOW (ref 8.9–10.3)
Chloride: 106 mmol/L (ref 98–111)
Chloride: 99 mmol/L (ref 98–111)
Creatinine, Ser: 0.61 mg/dL (ref 0.44–1.00)
Creatinine, Ser: 0.61 mg/dL (ref 0.44–1.00)
GFR, Estimated: >60 mL/min (ref >60)
GFR, Estimated: >60 mL/min (ref >60)
Glucose, Bld: 119 mg/dL — ABNORMAL HIGH (ref 70–99)
Glucose, Bld: 123 mg/dL — ABNORMAL HIGH (ref 70–99)
Potassium: 4.1 mmol/L (ref 3.5–5.1)
Potassium: 3.3 mmol/L — ABNORMAL LOW (ref 3.5–5.1)
Sodium: 134 mmol/L — ABNORMAL LOW (ref 135–145)
Sodium: 132 mmol/L — ABNORMAL LOW (ref 135–145)

## 2020-07-28 LAB — GLUCOSE, CAPILLARY
Glucose-Capillary: 117 mg/dL — ABNORMAL HIGH (ref 70–99)
Glucose-Capillary: 120 mg/dL — ABNORMAL HIGH (ref 70–99)
Glucose-Capillary: 122 mg/dL — ABNORMAL HIGH (ref 70–99)
Glucose-Capillary: 123 mg/dL — ABNORMAL HIGH (ref 70–99)
Glucose-Capillary: 129 mg/dL — ABNORMAL HIGH (ref 70–99)
Glucose-Capillary: 131 mg/dL — ABNORMAL HIGH (ref 70–99)
Glucose-Capillary: 132 mg/dL — ABNORMAL HIGH (ref 70–99)
Glucose-Capillary: 92 mg/dL (ref 70–99)
Glucose-Capillary: 99 mg/dL (ref 70–99)

## 2020-07-28 LAB — MAGNESIUM
Magnesium: 2.3 mg/dL (ref 1.7–2.4)
Magnesium: 2 mg/dL (ref 1.7–2.4)

## 2020-07-28 MED ORDER — ENOXAPARIN SODIUM 40 MG/0.4ML ~~LOC~~ SOLN
40.0000 mg | Freq: Every day | SUBCUTANEOUS | Status: DC
  Administered 2020-07-28 – 2020-08-02 (×6): 40 mg via SUBCUTANEOUS
  Filled 2020-07-28 (×6): qty 0.4

## 2020-07-28 MED ORDER — INSULIN ASPART 100 UNIT/ML ~~LOC~~ SOLN
0.0000 [IU] | SUBCUTANEOUS | Status: DC
  Administered 2020-07-28: 2 [IU] via SUBCUTANEOUS

## 2020-07-28 MED ORDER — POTASSIUM CHLORIDE CRYS ER 20 MEQ PO TBCR
20.0000 meq | EXTENDED_RELEASE_TABLET | Freq: Two times a day (BID) | ORAL | Status: DC
  Administered 2020-07-28: 20 meq via ORAL
  Filled 2020-07-28: qty 1

## 2020-07-28 MED ORDER — POTASSIUM CHLORIDE CRYS ER 20 MEQ PO TBCR
40.0000 meq | EXTENDED_RELEASE_TABLET | Freq: Three times a day (TID) | ORAL | Status: DC
  Administered 2020-07-28 (×2): 40 meq via ORAL
  Filled 2020-07-28 (×2): qty 2

## 2020-07-28 MED ORDER — FUROSEMIDE 10 MG/ML IJ SOLN
40.0000 mg | Freq: Two times a day (BID) | INTRAMUSCULAR | Status: AC
  Administered 2020-07-28 (×2): 40 mg via INTRAVENOUS
  Filled 2020-07-28 (×2): qty 4

## 2020-07-28 MED FILL — Thrombin (Recombinant) For Soln 20000 Unit: CUTANEOUS | Qty: 1 | Status: AC

## 2020-07-28 NOTE — Progress Notes (Signed)
Patient ID: Rebecca Farley, female   DOB: 1956/08/25, 64 y.o.   MRN: [id number] TCTS Evening Rounds:  Hemodynamically stable in sinus rhythm.  Good diuresis today. Replacing K+  BMET    Component Value Date/Time   NA 132 (L) 07/28/2020 1550   NA 139 06/15/2020 1502   K 3.3 (L) 07/28/2020 1550   CL 99 07/28/2020 1550   CO2 25 07/28/2020 1550   GLUCOSE 123 (H) 07/28/2020 1550   BUN 10 07/28/2020 1550   BUN 16 06/15/2020 1502   CREATININE 0.61 07/28/2020 1550   CALCIUM 7.9 (L) 07/28/2020 1550   GFRNONAA >60 07/28/2020 1550   GFRAA 89 06/15/2020 1502   CBC    Component Value Date/Time   WBC 14.2 (H) 07/28/2020 1550   RBC 3.00 (L) 07/28/2020 1550   HGB 9.2 (L) 07/28/2020 1550   HGB 12.7 06/15/2020 1502   HCT 27.7 (L) 07/28/2020 1550   HCT 36.8 06/15/2020 1502   PLT 103 (L) 07/28/2020 1550   PLT 241 06/15/2020 1502   MCV 92.3 07/28/2020 1550   MCV 90 06/15/2020 1502   MCH 30.7 07/28/2020 1550   MCHC 33.2 07/28/2020 1550   RDW 13.8 07/28/2020 1550   RDW 13.7 06/15/2020 1502   LYMPHSABS 2.7 11/19/2019 1200   MONOABS 0.7 11/19/2019 1200   EOSABS 0.2 11/19/2019 1200   BASOSABS 0.1 11/19/2019 1200   She feels well sitting up in chair.

## 2020-07-28 NOTE — Hospital Course (Addendum)
History of Present Illness:  [name] is a 64 year old woman with history of hypertension, hyperlipidemia, atrial flutter status post RF ablation in the past in [place], family history of aortic valve disease, and known heart murmur for over 10 years.  She presented with complaints of exertional shortness of breath and now fatigue that had been occurring over the past 6 months.  The patient also experienced some chest discomfort and cough neither of which were associated with exertion.  She has been followed by Dr. Isabel Caprice.  Previous Echocardiogram obtained in 2020 showed aortic valve stenosis with mean gradient of 38 mmHg across the aortic valve with a peak gradient of 70 mmHg.  Her valve area was 0.89 cm.  Repeat Echocardiogram obtained in September showed progression to critical aortic stenosis.  Due to this she underwent cardiac catheterization which showed non obstructive CAD.  It was felt the patient would require surgical aortic valve replacement.  She was referred to [facility] and evaluated by Dr. Laneta Simmers at which time she re-iterated the above mentioned symptoms.  She states just getting up and getting ready for work is extremely fatiguing.  It was felt with her young age she would benefit from conventional surgical Aortic Valve Replacement.  The risks and benefits of the procedure were explained to the patient and she was agreeable to proceed.    Hospital Course:    [name] presented to [facility] on 07/27/2020.  She was taken to the operating room and underwent Aortic Valve Replacement with a 23 mm [employer] Resilia Pericardial Tissue Valve.  She tolerated the procedure without difficulty and was taken to the SICU in stable condition.  She was extubated the evening of surgery without difficulty.  She developed expected post operative blood loss anemia and was transfused a unit of packed blood cells.  During her stay in the SICU the patient was weaned off  Neo-synephrine as blood pressure allowed.  Her chest tubes and arterial lines were removed without difficulty.  She was started on Lasix for volume overloaded state.  She developed Hypokalemia and was supplemented accordingly.  She was maintaining NSR and was started on low dose Metoprolol.  She was transferred to the progressive care unit on 07/29/2020.  She developed Atrial Fibrillation with RVR.  Her Metoprolol dose was increased.  She was started on IV Amiodarone and converted into Sinus Tachycardia which was junctional at times.  There was no evidence of atrial flutter waves.  Bedside rapid atrial pacing was attempted and was unsuccessful.  Her Lopressor dose was again increased for additional HR control.  EKG was obtained and showed accelerated junctional rhythm.  She was started on Digoxin for additional rate control.  EP consult was obtained to ensure there was no CHB present.  They felt the patients rhythm was junctional and should recover with time.  They did not feel PPM would be indicated.  They also recommended tapering of Amiodarone and Digoxin as an outpatient as her heart rhythm recovers. She remained hypertensive and was restarted on her home regimen of Diovan at a reduced dose.  She developed a left AC phlebitis and was started on Keflex.  Her pacing wires were removed without difficulty.  She is ambulating independently.  Her rhythm has been stable, remains junctional in the 90s.  Her incisions are healing without evidence of infection.  Her phlebitis is improving.  She is medically stable for discharge home today.

## 2020-07-28 NOTE — Discharge Instructions (Signed)

## 2020-07-28 NOTE — Progress Notes (Signed)
Verbal order to replace Ca++ of 7.0 with 1g calcium gluconate per Dorris Fetch MD. Calcium gluconate mixed with albumin and hung. Pt tolerated well. RN will continue to monitor.

## 2020-07-28 NOTE — Progress Notes (Signed)
1 Day Post-Op Procedure(s) (LRB): AORTIC VALVE REPLACEMENT (AVR) USING 23 MM INSPIRIS RESILIA  AORTIC VALVE (N/A) TRANSESOPHAGEAL ECHOCARDIOGRAM (TEE) (N/A) Subjective: No complaints. Pain under good control  Objective: Vital signs in last 24 hours: Temp:  [94.8 F (34.9 C)-99.3 F (37.4 C)] 98.2 F (36.8 C) (10/22 0700) Pulse Rate:  [70-80] 79 (10/22 0700) Cardiac Rhythm: Atrial paced (10/22 0400) Resp:  [9-29] 19 (10/22 0700) BP: (90-118)/(55-80) 118/65 (10/22 0700) SpO2:  [92 %-100 %] 95 % (10/22 0700) Arterial Line BP: (92-144)/(47-69) 130/56 (10/22 0700) FiO2 (%):  [40 %-50 %] 40 % (10/21 1535) Weight:  [94.3 kg] 94.3 kg (10/22 0432)  Hemodynamic parameters for last 24 hours: PAP: (19-42)/(9-29) 30/12 CO:  [3.3 L/min-5.9 L/min] 4.6 L/min CI:  [1.7 L/min/m2-3 L/min/m2] 2.4 L/min/m2  Intake/Output from previous day: 10/21 0701 - 10/22 0700 In: 7171.8 [P.O.:200; I.V.:3634.3; Blood:545; IV Piggyback:2792.6] Out: 3360 [Urine:2375; Blood:600; Chest Tube:385] Intake/Output this shift: No intake/output data recorded.  General appearance: alert and cooperative Neurologic: intact Heart: regular rate and rhythm Lungs: clear to auscultation bilaterally Extremities: edema mild Wound: dressing dry  Lab Results: Recent Labs    07/27/20 2150 07/28/20 0315  WBC 12.2* 13.1*  HGB 9.4* 8.8*  HCT 28.2* 27.0*  PLT 106* 113*   BMET:  Recent Labs    07/27/20 2150 07/28/20 0315  NA 135 134*  K 3.3* 4.1  CL 105 106  CO2 22 22  GLUCOSE 148* 119*  BUN 9 9  CREATININE 0.55 0.61  CALCIUM 7.0* 7.7*    PT/INR:  Recent Labs    07/27/20 1203  LABPROT 19.1*  INR 1.7*   ABG    Component Value Date/Time   PHART 7.347 (L) 07/27/2020 1713   HCO3 24.1 07/27/2020 1713   TCO2 25 07/27/2020 1713   ACIDBASEDEF 2.0 07/27/2020 1713   O2SAT 97.0 07/27/2020 1713   CBG (last 3)  Recent Labs    07/28/20 0007 07/28/20 0140 07/28/20 0314  GLUCAP 117* 92 122*   CXR:  clear  ECG: sinus, no acute changes.  Assessment/Plan: S/P Procedure(s) (LRB): AORTIC VALVE REPLACEMENT (AVR) USING 23 MM INSPIRIS RESILIA  AORTIC VALVE (N/A) TRANSESOPHAGEAL ECHOCARDIOGRAM (TEE) (N/A)  POD 1 Hemodynamically stable in sinus rhythm. Hold Lopressor until BP stable off neo.  Volume excess: Start diuresis  DC chest tubes, swan, arterial line.  OOB, mobilize, IS  No hx of DM and preop Hgb A1c was  5.4. will stop CBG's since glucose normal.   LOS: 1 day    Rebecca Farley 07/28/2020

## 2020-07-29 ENCOUNTER — Inpatient Hospital Stay (HOSPITAL_COMMUNITY): Payer: Medicare Other

## 2020-07-29 LAB — BASIC METABOLIC PANEL
Anion gap: 7 (ref 5–15)
BUN: 10 mg/dL (ref 8–23)
CO2: 28 mmol/L (ref 22–32)
Calcium: 8 mg/dL — ABNORMAL LOW (ref 8.9–10.3)
Chloride: 101 mmol/L (ref 98–111)
Creatinine, Ser: 0.67 mg/dL (ref 0.44–1.00)
GFR, Estimated: >60 mL/min (ref >60)
Glucose, Bld: 126 mg/dL — ABNORMAL HIGH (ref 70–99)
Potassium: 3.6 mmol/L (ref 3.5–5.1)
Sodium: 136 mmol/L (ref 135–145)

## 2020-07-29 LAB — CBC
HCT: 27.7 % — ABNORMAL LOW (ref 36.0–46.0)
Hemoglobin: 8.9 g/dL — ABNORMAL LOW (ref 12.0–15.0)
MCH: 30 pg (ref 26.0–34.0)
MCHC: 32.1 g/dL (ref 30.0–36.0)
MCV: 93.3 fL (ref 80.0–100.0)
Platelets: 94 10*3/uL — ABNORMAL LOW (ref 150–400)
RBC: 2.97 MIL/uL — ABNORMAL LOW (ref 3.87–5.11)
RDW: 13.9 % (ref 11.5–15.5)
WBC: 8.8 10*3/uL (ref 4.0–10.5)
nRBC: 0 % (ref 0.0–0.2)

## 2020-07-29 LAB — GLUCOSE, CAPILLARY: Glucose-Capillary: 125 mg/dL — ABNORMAL HIGH (ref 70–99)

## 2020-07-29 MED ORDER — PANTOPRAZOLE SODIUM 40 MG PO TBEC
40.0000 mg | DELAYED_RELEASE_TABLET | Freq: Every day | ORAL | Status: DC
  Administered 2020-07-30 – 2020-08-03 (×5): 40 mg via ORAL
  Filled 2020-07-29 (×5): qty 1

## 2020-07-29 MED ORDER — ASPIRIN EC 325 MG PO TBEC
325.0000 mg | DELAYED_RELEASE_TABLET | Freq: Every day | ORAL | Status: DC
  Administered 2020-07-30 – 2020-08-03 (×5): 325 mg via ORAL
  Filled 2020-07-29 (×5): qty 1

## 2020-07-29 MED ORDER — TRAMADOL HCL 50 MG PO TABS
50.0000 mg | ORAL_TABLET | Freq: Four times a day (QID) | ORAL | Status: DC | PRN
  Administered 2020-07-29 – 2020-07-31 (×2): 50 mg via ORAL
  Filled 2020-07-29 (×2): qty 1

## 2020-07-29 MED ORDER — ~~LOC~~ CARDIAC SURGERY, PATIENT & FAMILY EDUCATION
Freq: Once | Status: AC
  Administered 2020-07-29: 1

## 2020-07-29 MED ORDER — METOPROLOL TARTRATE 12.5 MG HALF TABLET
12.5000 mg | ORAL_TABLET | Freq: Two times a day (BID) | ORAL | Status: DC
  Administered 2020-07-29 – 2020-07-30 (×3): 12.5 mg via ORAL
  Filled 2020-07-29 (×3): qty 1

## 2020-07-29 MED ORDER — SODIUM CHLORIDE 0.9% FLUSH
3.0000 mL | Freq: Two times a day (BID) | INTRAVENOUS | Status: DC
  Administered 2020-07-29 – 2020-08-02 (×8): 3 mL via INTRAVENOUS

## 2020-07-29 MED ORDER — SODIUM CHLORIDE 0.9% FLUSH: 3.0000 mL | INTRAVENOUS | Status: DC | PRN

## 2020-07-29 MED ORDER — POTASSIUM CHLORIDE CRYS ER 20 MEQ PO TBCR
20.0000 meq | EXTENDED_RELEASE_TABLET | Freq: Two times a day (BID) | ORAL | Status: AC
  Administered 2020-07-29 (×2): 20 meq via ORAL
  Filled 2020-07-29: qty 1

## 2020-07-29 MED ORDER — ONDANSETRON HCL 4 MG PO TABS: 4.0000 mg | ORAL_TABLET | Freq: Four times a day (QID) | ORAL | Status: DC | PRN

## 2020-07-29 MED ORDER — SENNOSIDES-DOCUSATE SODIUM 8.6-50 MG PO TABS: 1.0000 | ORAL_TABLET | Freq: Two times a day (BID) | ORAL | Status: DC | PRN

## 2020-07-29 MED ORDER — FE FUMARATE-B12-VIT C-FA-IFC PO CAPS
1.0000 | ORAL_CAPSULE | Freq: Two times a day (BID) | ORAL | Status: DC
  Administered 2020-07-29 – 2020-08-03 (×10): 1 via ORAL
  Filled 2020-07-29 (×11): qty 1

## 2020-07-29 MED ORDER — ONDANSETRON HCL 4 MG/2ML IJ SOLN: 4.0000 mg | Freq: Four times a day (QID) | INTRAMUSCULAR | Status: DC | PRN

## 2020-07-29 MED ORDER — ACETAMINOPHEN 325 MG PO TABS
650.0000 mg | ORAL_TABLET | Freq: Four times a day (QID) | ORAL | Status: DC | PRN
  Administered 2020-07-30 – 2020-08-02 (×3): 650 mg via ORAL
  Filled 2020-07-29 (×3): qty 2

## 2020-07-29 MED ORDER — SODIUM CHLORIDE 0.9 % IV SOLN: 250.0000 mL | INTRAVENOUS | Status: DC | PRN

## 2020-07-29 NOTE — Progress Notes (Signed)
CARDIAC REHAB PHASE I   PRE:  Rate/Rhythm: 81 SR    BP: sitting 121/72    SaO2: 92 RA  MODE:  Ambulation: 470 ft   POST:  Rate/Rhythm: 93 SR    BP: sitting 129/66     SaO2: 93 RA  Pt doing very well. Able to move independently to EOB and stand following sternal precautions (a few reminders). Steady in hall with RW, SOB x2. VSS. Return to bed for rest. Encouraged another walk and IS. Hitting 1000 mL.   [id number]  Harriet Masson CES, ACSM 07/29/2020 2:47 PM

## 2020-07-29 NOTE — Progress Notes (Signed)
2 Days Post-Op Procedure(s) (LRB): AORTIC VALVE REPLACEMENT (AVR) USING 23 MM INSPIRIS RESILIA  AORTIC VALVE (N/A) TRANSESOPHAGEAL ECHOCARDIOGRAM (TEE) (N/A) Subjective: No complaints  Objective: Vital signs in last 24 hours: Temp:  [97.5 F (36.4 C)-98.4 F (36.9 C)] 97.9 F (36.6 C) (10/23 0801) Pulse Rate:  [68-84] 75 (10/23 0900) Cardiac Rhythm: Normal sinus rhythm (10/23 0800) Resp:  [11-24] 17 (10/23 0900) BP: (102-153)/(64-80) 121/74 (10/23 0900) SpO2:  [91 %-98 %] 91 % (10/23 0900)  Hemodynamic parameters for last 24 hours:    Intake/Output from previous day: 10/22 0701 - 10/23 0700 In: 670.4 [P.O.:100; I.V.:420.4; IV Piggyback:150] Out: 2585 [IDPOE:4235; Chest Tube:70] Intake/Output this shift: Total I/O In: 20 [I.V.:20] Out: -   General appearance: alert and cooperative Neurologic: intact Heart: regular rate and rhythm, S1, S2 normal, no murmur Lungs: clear to auscultation bilaterally Extremities: extremities normal, atraumatic, no cyanosis or edema Wound: dressing dry  Lab Results: Recent Labs    07/28/20 1550 07/29/20 0524  WBC 14.2* 8.8  HGB 9.2* 8.9*  HCT 27.7* 27.7*  PLT 103* 94*   BMET:  Recent Labs    07/28/20 1550 07/29/20 0524  NA 132* 136  K 3.3* 3.6  CL 99 101  CO2 25 28  GLUCOSE 123* 126*  BUN 10 10  CREATININE 0.61 0.67  CALCIUM 7.9* 8.0*    PT/INR:  Recent Labs    07/27/20 1203  LABPROT 19.1*  INR 1.7*   ABG    Component Value Date/Time   PHART 7.347 (L) 07/27/2020 1713   HCO3 24.1 07/27/2020 1713   TCO2 25 07/27/2020 1713   ACIDBASEDEF 2.0 07/27/2020 1713   O2SAT 97.0 07/27/2020 1713   CBG (last 3)  Recent Labs    07/28/20 1930 07/28/20 2350 07/29/20 0327  GLUCAP 123* 120* 125*   CXR: mild bibasilar atelectasis.  Assessment/Plan: S/P Procedure(s) (LRB): AORTIC VALVE REPLACEMENT (AVR) USING 23 MM INSPIRIS RESILIA  AORTIC VALVE (N/A) TRANSESOPHAGEAL ECHOCARDIOGRAM (TEE) (N/A)  POD 2 Hemodynamically  stable. Start low dose Lopressor.  Diuresed -4L yesterday. No wt recorded today. Will hold off on further diuresis today.  Transfer to 4E and continue mobilization.   LOS: 2 days    Alleen Borne 07/29/2020

## 2020-07-29 NOTE — Progress Notes (Signed)
Pt arrived to 4e from 2h. Pt oriented to room and staff. Vitals obtained. Telemetry box applied and CCMD notified x2 verifiers.

## 2020-07-29 NOTE — Progress Notes (Signed)
Anesthesiology Follow-up:  Awake and alert, in good spirits, walking around the ICU=,  VS: T- 36.6 BP-114/65 HR- 75 (SR) RR- 15 O2 sat-96% on RA  K- 3.6 BUN/Cr.- 10/0.67 H/H- 8.9/27.7 Platelets- 58,22  64 year old female with severe AS and preserved LV function,  2 days S/P AVR with 23 Edwards INSPIRIS bioprosthetic valve.  Extubated 4 hours post-op, No apparent anesthetic complications,  Rebecca Farley

## 2020-07-29 NOTE — Progress Notes (Signed)
Patient transferred with 4E via wheelchair by SWOT RN. All belongings transferred with patient. Daughter at patients side.

## 2020-07-29 NOTE — Plan of Care (Signed)

## 2020-07-29 NOTE — Addendum Note (Signed)
Addendum  created 07/29/20 3825 by Kipp Brood, MD   Clinical Note Signed

## 2020-07-30 LAB — TYPE AND SCREEN
ABO/RH(D): O POS
Antibody Screen: NEGATIVE
Unit division: 0
Unit division: 0

## 2020-07-30 LAB — BPAM RBC
Blood Product Expiration Date: 202111232359
Blood Product Expiration Date: 202111232359
ISSUE DATE / TIME: 202110211041
ISSUE DATE / TIME: 202110211729
Unit Type and Rh: 5100
Unit Type and Rh: 5100

## 2020-07-30 MED ORDER — METOPROLOL TARTRATE 25 MG PO TABS
25.0000 mg | ORAL_TABLET | Freq: Two times a day (BID) | ORAL | Status: DC
  Administered 2020-07-30 (×2): 25 mg via ORAL
  Filled 2020-07-30 (×2): qty 1

## 2020-07-30 MED ORDER — AMIODARONE HCL IN DEXTROSE 360-4.14 MG/200ML-% IV SOLN
30.0000 mg/h | INTRAVENOUS | Status: DC
  Administered 2020-07-30: 30 mg/h via INTRAVENOUS
  Filled 2020-07-30 (×3): qty 200

## 2020-07-30 MED ORDER — AMIODARONE HCL IN DEXTROSE 360-4.14 MG/200ML-% IV SOLN
60.0000 mg/h | INTRAVENOUS | Status: AC
  Administered 2020-07-30: 60 mg/h via INTRAVENOUS
  Filled 2020-07-30: qty 200

## 2020-07-30 MED ORDER — AMIODARONE LOAD VIA INFUSION
150.0000 mg | Freq: Once | INTRAVENOUS | Status: AC
  Administered 2020-07-30: 150 mg via INTRAVENOUS
  Filled 2020-07-30: qty 83.34

## 2020-07-30 MED ORDER — FUROSEMIDE 40 MG PO TABS
40.0000 mg | ORAL_TABLET | Freq: Every day | ORAL | Status: DC
  Administered 2020-07-30 – 2020-08-01 (×3): 40 mg via ORAL
  Filled 2020-07-30 (×3): qty 1

## 2020-07-30 MED ORDER — POTASSIUM CHLORIDE CRYS ER 20 MEQ PO TBCR
20.0000 meq | EXTENDED_RELEASE_TABLET | Freq: Every day | ORAL | Status: DC
  Administered 2020-07-30: 20 meq via ORAL
  Filled 2020-07-30: qty 1

## 2020-07-30 NOTE — Progress Notes (Signed)
      [address]       [place] [address]             [phone]        3 Days Post-Op Procedure(s) (LRB): AORTIC VALVE REPLACEMENT (AVR) USING 23 MM INSPIRIS RESILIA  AORTIC VALVE (N/A) TRANSESOPHAGEAL ECHOCARDIOGRAM (TEE) (N/A)  Subjective: Patient has not had a bowel movement yet.  Objective: Vital signs in last 24 hours: Temp:  [98.4 F (36.9 C)-98.8 F (37.1 C)] 98.4 F (36.9 C) (10/24 0729) Pulse Rate:  [75-131] 131 (10/24 0729) Cardiac Rhythm: Junctional rhythm;Other (Comment) (10/24 0700) Resp:  [17-29] 29 (10/24 0729) BP: (121-166)/(74-97) 166/97 (10/24 0729) SpO2:  [90 %-97 %] 96 % (10/24 0729) Weight:  [89.8 kg] 89.8 kg (10/24 0404)  Pre op weight 94.3 kg Current Weight  07/30/20 89.8 kg      Intake/Output from previous day: 10/23 0701 - 10/24 0700 In: 500 [P.O.:477; I.V.:23] Out: 120 [Emesis/NG output:120]   Physical Exam:  Cardiovascular: IRRR Pulmonary: Slightly diminished bibasilar breath sounds Abdomen: Soft, non tender, bowel sounds present. Extremities: Mild bilateral lower extremity edema. Wounds: Sternal dressing removed and wound is clean and dry.  No erythema or signs of infection.  Lab Results: CBC: Recent Labs    07/28/20 1550 07/29/20 0524  WBC 14.2* 8.8  HGB 9.2* 8.9*  HCT 27.7* 27.7*  PLT 103* 94*   BMET:  Recent Labs    07/28/20 1550 07/29/20 0524  NA 132* 136  K 3.3* 3.6  CL 99 101  CO2 25 28  GLUCOSE 123* 126*  BUN 10 10  CREATININE 0.61 0.67  CALCIUM 7.9* 8.0*    PT/INR:  Lab Results  Component Value Date   INR 1.7 (H) 07/27/2020   INR 1.0 07/25/2020   INR 0.9 11/19/2019   ABG:  INR: Will add last result for INR, ABG once components are confirmed Will add last 4 CBG results once components are confirmed  Assessment/Plan:  1. CV - Likely having a fib with RVR this am. As discussed with Dr. Laneta Simmers, will start Amiodarone drip and increase Lopressor. May need to restart low dose  Valsartan if continues with hypertension. Will place on backup pacer VVI at 50. 2.  Pulmonary - On room air. Encourage incentive spirometer. 3. Volume Overload - Will give Lasix 40 mg orally 4.  Expected post op acute blood loss anemia - H and H yesterday 8.9 and 27.7. Continue Trinsicon 5. Thrombocytopenia-platelets yesterday 94,000 6. Epicardial pacing wires to remain today  Keishawn Darsey M ZimmermanPA-C 07/30/2020,8:11 AM

## 2020-07-30 NOTE — Progress Notes (Signed)
°  Amiodarone Drug - Drug Interaction Consult Note  Recommendations: Monitor patient for bradycardia while on metoprolol. Monitor patient for hyperkalemia while on Lasix.     Amiodarone is metabolized by the cytochrome P450 system and therefore has the potential to cause many drug interactions. Amiodarone has an average plasma half-life of 50 days (range 20 to 100 days).   There is potential for drug interactions to occur several weeks or months after stopping treatment and the onset of drug interactions may be slow after initiating amiodarone.   []  Statins: Increased risk of myopathy. Simvastatin- restrict dose to 20mg  daily. Other statins: counsel patients to report any muscle pain or weakness immediately.  []  Anticoagulants: Amiodarone can increase anticoagulant effect. Consider warfarin dose reduction. Patients should be monitored closely and the dose of anticoagulant altered accordingly, remembering that amiodarone levels take several weeks to stabilize.  []  Antiepileptics: Amiodarone can increase plasma concentration of phenytoin, the dose should be reduced. Note that small changes in phenytoin dose can result in large changes in levels. Monitor patient and counsel on signs of toxicity.  [x]  Beta blockers: increased risk of bradycardia, AV block and myocardial depression. Sotalol - avoid concomitant use.  []   Calcium channel blockers (diltiazem and verapamil): increased risk of bradycardia, AV block and myocardial depression.  []   Cyclosporine: Amiodarone increases levels of cyclosporine. Reduced dose of cyclosporine is recommended.  []  Digoxin dose should be halved when amiodarone is started.  [x]  Diuretics: increased risk of cardiotoxicity if hypokalemia occurs.  []  Oral hypoglycemic agents (glyburide, glipizide, glimepiride): increased risk of hypoglycemia. Patient's glucose levels should be monitored closely when initiating amiodarone therapy.   []  Drugs that prolong the QT  interval:  Torsades de pointes risk may be increased with concurrent use - avoid if possible.  Monitor QTc, also keep magnesium/potassium WNL if concurrent therapy can't be avoided.  Antibiotics: e.g. fluoroquinolones, erythromycin.  Antiarrhythmics: e.g. quinidine, procainamide, disopyramide, sotalol.  Antipsychotics: e.g. phenothiazines, haloperidol.   Lithium, tricyclic antidepressants, and methadone. Thank You,   , PharmD PGY1 Acute Care Pharmacy Resident Phone: [phone] 07/30/2020 8:46 AM  Please check [email or web address] for unit specific pharmacy phone numbers.

## 2020-07-30 NOTE — Progress Notes (Signed)
Mobility Specialist: Progress Note   07/30/20 1346  Mobility  Activity Ambulated in hall  Level of Assistance Modified independent, requires aide device or extra time  Assistive Device Front wheel walker  Distance Ambulated (ft) 470 ft  Mobility Response Tolerated well  Mobility performed by Mobility specialist  Bed Position Chair  $Mobility charge 1 Mobility   During Mobility: 115 HR, 94% SpO2 Post-Mobility: 114 HR, 124/65 BP, 94% SpO2  Pt stopped to take two standing rest breaks, one at each end of the hallway, due to feeling SOB. Reminded pt of sternal precautions and encouraged pt to walk again later this evening. Pt is very motivated to walk.   [facility] Abhijot Straughter Mobility Specialist

## 2020-07-31 ENCOUNTER — Inpatient Hospital Stay (HOSPITAL_COMMUNITY): Payer: Medicare Other

## 2020-07-31 LAB — CBC
HCT: 29.2 % — ABNORMAL LOW (ref 36.0–46.0)
Hemoglobin: 9.5 g/dL — ABNORMAL LOW (ref 12.0–15.0)
MCH: 30.2 pg (ref 26.0–34.0)
MCHC: 32.5 g/dL (ref 30.0–36.0)
MCV: 92.7 fL (ref 80.0–100.0)
Platelets: 175 10*3/uL (ref 150–400)
RBC: 3.15 MIL/uL — ABNORMAL LOW (ref 3.87–5.11)
RDW: 14.6 % (ref 11.5–15.5)
WBC: 11.4 10*3/uL — ABNORMAL HIGH (ref 4.0–10.5)
nRBC: 0 % (ref 0.0–0.2)

## 2020-07-31 LAB — BASIC METABOLIC PANEL
Anion gap: 10 (ref 5–15)
BUN: 8 mg/dL (ref 8–23)
CO2: 26 mmol/L (ref 22–32)
Calcium: 8 mg/dL — ABNORMAL LOW (ref 8.9–10.3)
Chloride: 103 mmol/L (ref 98–111)
Creatinine, Ser: 0.69 mg/dL (ref 0.44–1.00)
GFR, Estimated: >60 mL/min (ref >60)
Glucose, Bld: 110 mg/dL — ABNORMAL HIGH (ref 70–99)
Potassium: 3.3 mmol/L — ABNORMAL LOW (ref 3.5–5.1)
Sodium: 139 mmol/L (ref 135–145)

## 2020-07-31 LAB — SURGICAL PATHOLOGY

## 2020-07-31 MED ORDER — DIGOXIN 125 MCG PO TABS
0.1250 mg | ORAL_TABLET | Freq: Every day | ORAL | Status: DC
  Administered 2020-08-01 – 2020-08-03 (×3): 0.125 mg via ORAL
  Filled 2020-07-31 (×3): qty 1

## 2020-07-31 MED ORDER — AMIODARONE HCL 200 MG PO TABS
400.0000 mg | ORAL_TABLET | Freq: Two times a day (BID) | ORAL | Status: DC
  Administered 2020-07-31 – 2020-08-02 (×5): 400 mg via ORAL
  Filled 2020-07-31 (×5): qty 2

## 2020-07-31 MED ORDER — POTASSIUM CHLORIDE CRYS ER 20 MEQ PO TBCR
40.0000 meq | EXTENDED_RELEASE_TABLET | Freq: Once | ORAL | Status: AC
  Administered 2020-07-31: 40 meq via ORAL
  Filled 2020-07-31: qty 2

## 2020-07-31 MED ORDER — DIGOXIN 0.25 MG/ML IJ SOLN
0.5000 mg | Freq: Once | INTRAMUSCULAR | Status: AC
  Administered 2020-07-31: 0.5 mg via INTRAVENOUS
  Filled 2020-07-31: qty 2

## 2020-07-31 MED ORDER — DIGOXIN 0.25 MG/ML IJ SOLN
0.2500 mg | Freq: Four times a day (QID) | INTRAMUSCULAR | Status: AC
  Administered 2020-07-31 – 2020-08-01 (×2): 0.25 mg via INTRAVENOUS
  Filled 2020-07-31 (×2): qty 1

## 2020-07-31 MED ORDER — METOPROLOL TARTRATE 50 MG PO TABS
50.0000 mg | ORAL_TABLET | Freq: Two times a day (BID) | ORAL | Status: DC
  Administered 2020-07-31 – 2020-08-03 (×7): 50 mg via ORAL
  Filled 2020-07-31 (×7): qty 1

## 2020-07-31 NOTE — Care Management Important Message (Signed)
Important Message  Patient Details  Name: Rebecca Farley MRN: [id number] Date of Birth: [birth year]   Medicare Important Message Given:  Yes     Renie Ora 07/31/2020, 9:52 AM

## 2020-07-31 NOTE — Addendum Note (Signed)
Addendum  created 07/31/20 0817 by Adair Laundry, CRNA   Order list changed

## 2020-07-31 NOTE — Progress Notes (Signed)
Mobility Specialist: Progress Note   07/31/20 1424  Mobility  Activity Ambulated in hall  Level of Assistance Modified independent, requires aide device or extra time  Assistive Device Front wheel walker  Distance Ambulated (ft) 470 ft  Mobility Response Tolerated well  Mobility performed by Mobility specialist  Bed Position Chair  $Mobility charge 1 Mobility   Pre-Mobility: 112 HR, 114/71 BP, 94% SpO2 During Mobility: 114 HR, 94% SpO2 Post-Mobility: 117 HR, 119/80 BP, 95% SpO2  Pt stopped to take one standing rest break at the end of the hall due to feeling SOB lasting only a few seconds. Pt said overall she feels good and is hopeful to discharge tomorrow.   [facility] Odell Fasching Mobility Specialist

## 2020-07-31 NOTE — Progress Notes (Addendum)
      [address]       [place] [address]             [phone]      4 Days Post-Op Procedure(s) (LRB): AORTIC VALVE REPLACEMENT (AVR) USING 23 MM INSPIRIS RESILIA  AORTIC VALVE (N/A) TRANSESOPHAGEAL ECHOCARDIOGRAM (TEE) (N/A)   Subjective:  Patient is up in chair.  Has no new complaints.  Had a BM yesterday  Objective: Vital signs in last 24 hours: Temp:  [97.8 F (36.6 C)-98.5 F (36.9 C)] 98.5 F (36.9 C) (10/25 0325) Pulse Rate:  [111-131] 115 (10/25 0325) Cardiac Rhythm: Junctional rhythm (10/24 2029) Resp:  [20-29] 20 (10/25 0325) BP: (111-166)/(73-97) 135/92 (10/25 0325) SpO2:  [92 %-97 %] 94 % (10/25 0325) Weight:  [88.5 kg] 88.5 kg (10/25 0321)  Intake/Output from previous day: 10/24 0701 - 10/25 0700 In: 678.6 [P.O.:240; I.V.:438.6] Out: -   General appearance: alert, cooperative and no distress Heart: regular rate and rhythm and tachy Lungs: diminished breath sounds bibasilar Abdomen: soft, non-tender; bowel sounds normal; no masses,  no organomegaly Extremities: edema none appreciated Wound: clean and dry  Lab Results: Recent Labs    07/29/20 0524 07/31/20 0350  WBC 8.8 11.4*  HGB 8.9* 9.5*  HCT 27.7* 29.2*  PLT 94* 175   BMET:  Recent Labs    07/29/20 0524 07/31/20 0350  NA 136 139  K 3.6 3.3*  CL 101 103  CO2 28 26  GLUCOSE 126* 110*  BUN 10 8  CREATININE 0.67 0.69  CALCIUM 8.0* 8.0*    PT/INR: No results for input(s): LABPROT, INR in the last 72 hours. ABG    Component Value Date/Time   PHART 7.347 (L) 07/27/2020 1713   HCO3 24.1 07/27/2020 1713   TCO2 25 07/27/2020 1713   ACIDBASEDEF 2.0 07/27/2020 1713   O2SAT 97.0 07/27/2020 1713   CBG (last 3)  Recent Labs    07/28/20 1930 07/28/20 2350 07/29/20 0327  GLUCAP 123* 120* 125*    Assessment/Plan: S/P Procedure(s) (LRB): AORTIC VALVE REPLACEMENT (AVR) USING 23 MM INSPIRIS RESILIA  AORTIC VALVE (N/A) TRANSESOPHAGEAL ECHOCARDIOGRAM (TEE)  (N/A)  1. CV- some A. Fib yesterday, appears in junctional tachycardia this morning, remains Hypertensive- will transition to oral Amiodarone, increase Lopressor to 50 mg BID 2. Pulm- no acute issues, off oxygen, CXR with atelectasis, small left pleural effusion 3. Renal- creatinine WNL, no edema on exam, weight is below baseline, on Lasix 40 mg daily 4. Hypokalemia- will supplement additional 40 meq today 5. Expected post operative anemia- H/H at 9.5/29.2 6. Dispo- patient stable, out of A. Fib, now with junctional tachycardia will titrate BB, will transition to oral Amiodarone, supplement potassium, continue iron for post operative anemia   LOS: 4 days    Lowella Dandy, PA-C 07/31/2020   Chart reviewed, patient examined, agree with above. Rhythm is regular 120's. I don't see flutter waves but junctional tachy would be unusual and sinus tachy 120's at rest also unusual. I tried to rapid atrial pace but unsuccessful. Will increase Lopressor. Check ECG. Continue amio po. Wt is close to preop. Will stop lasix after today.

## 2020-07-31 NOTE — Progress Notes (Signed)
CARDIAC REHAB PHASE I   PRE:  Rate/Rhythm: 118 ST  BP:  Supine:   Sitting: 135/96     SaO2: 94% RA  MODE:  Ambulation: 430 ft   POST:  Rate/Rhythm: 123 ST with possible Junctional tachy  BP:  Supine:   Sitting: 141/82    SaO2: 96% RA  Pt was seated in recliner and was independent getting out of chair and standing using sternal precautions. Pt ambulated 430 ft with RW and tolerated well. Pt's only complaint was SOB. Pt had to stop once at end of hall to catch her breath. Returned pt to bed. Discussed sternal precautions, IS use, diet, restrictions, and exercise guidelines. Pt was receptive. Discussed CRP II, pt verbalized interest. Will send referral to [place] CRP II.  [id number] Norris Cross, MS, CEP 07/31/2020

## 2020-08-01 DIAGNOSIS — I498 Other specified cardiac arrhythmias: Secondary | ICD-10-CM

## 2020-08-01 LAB — BASIC METABOLIC PANEL
Anion gap: 11 (ref 5–15)
BUN: 15 mg/dL (ref 8–23)
CO2: 22 mmol/L (ref 22–32)
Calcium: 8.4 mg/dL — ABNORMAL LOW (ref 8.9–10.3)
Chloride: 104 mmol/L (ref 98–111)
Creatinine, Ser: 0.8 mg/dL (ref 0.44–1.00)
GFR, Estimated: >60 mL/min (ref >60)
Glucose, Bld: 127 mg/dL — ABNORMAL HIGH (ref 70–99)
Potassium: 4 mmol/L (ref 3.5–5.1)
Sodium: 137 mmol/L (ref 135–145)

## 2020-08-01 MED ORDER — POTASSIUM CHLORIDE CRYS ER 20 MEQ PO TBCR
20.0000 meq | EXTENDED_RELEASE_TABLET | Freq: Two times a day (BID) | ORAL | Status: AC
  Administered 2020-08-01 (×2): 20 meq via ORAL
  Filled 2020-08-01 (×2): qty 1

## 2020-08-01 MED ORDER — IRBESARTAN 150 MG PO TABS
75.0000 mg | ORAL_TABLET | Freq: Every day | ORAL | Status: DC
  Administered 2020-08-01 – 2020-08-03 (×3): 75 mg via ORAL
  Filled 2020-08-01 (×3): qty 1

## 2020-08-01 MED ORDER — CEPHALEXIN 500 MG PO CAPS
500.0000 mg | ORAL_CAPSULE | Freq: Three times a day (TID) | ORAL | Status: DC
  Administered 2020-08-01 – 2020-08-03 (×6): 500 mg via ORAL
  Filled 2020-08-01 (×6): qty 1

## 2020-08-01 NOTE — Progress Notes (Addendum)
[address]       [place] [address]             [phone]      5 Days Post-Op Procedure(s) (LRB): AORTIC VALVE REPLACEMENT (AVR) USING 23 MM INSPIRIS RESILIA  AORTIC VALVE (N/A) TRANSESOPHAGEAL ECHOCARDIOGRAM (TEE) (N/A)   Subjective:  Patient states she didn't have a good night.  States they had trouble with her IV in her left arm.  The area is very painful, ice has provided relief.  Objective: Vital signs in last 24 hours: Temp:  [98 F (36.7 C)-98.5 F (36.9 C)] 98.1 F (36.7 C) (10/26 0737) Pulse Rate:  [98-122] 98 (10/26 0737) Cardiac Rhythm: Junctional rhythm (10/25 2002) Resp:  [13-20] 13 (10/26 0737) BP: (123-148)/(70-93) 148/78 (10/26 0737) SpO2:  [94 %-98 %] 97 % (10/26 0737) Weight:  [87.1 kg] 87.1 kg (10/26 0405)  Intake/Output from previous day: 10/25 0701 - 10/26 0700 In: 240 [P.O.:240] Out: -   General appearance: alert, cooperative and no distress Heart: regular rate and rhythm Lungs: clear to auscultation bilaterally Abdomen: soft, non-tender; bowel sounds normal; no masses,  no organomegaly Extremities: Left AC area with erythema, edema, tenderness, LE mild edema Wound: clean and dry  Lab Results: Recent Labs    07/31/20 0350  WBC 11.4*  HGB 9.5*  HCT 29.2*  PLT 175   BMET:  Recent Labs    07/31/20 0350 08/01/20 0214  NA 139 137  K 3.3* 4.0  CL 103 104  CO2 26 22  GLUCOSE 110* 127*  BUN 8 15  CREATININE 0.69 0.80  CALCIUM 8.0* 8.4*    PT/INR: No results for input(s): LABPROT, INR in the last 72 hours. ABG    Component Value Date/Time   PHART 7.347 (L) 07/27/2020 1713   HCO3 24.1 07/27/2020 1713   TCO2 25 07/27/2020 1713   ACIDBASEDEF 2.0 07/27/2020 1713   O2SAT 97.0 07/27/2020 1713   CBG (last 3)  No results for input(s): GLUCAP in the last 72 hours.  Assessment/Plan: S/P Procedure(s) (LRB): AORTIC VALVE REPLACEMENT (AVR) USING 23 MM INSPIRIS RESILIA  AORTIC VALVE (N/A) TRANSESOPHAGEAL  ECHOCARDIOGRAM (TEE) (N/A)  1. CV- Junctional rhythm persists, rates in the 90s this morning- on Amiodarone 400 mg BID, Digoxin 0.125, Lopressor 50 mg BID, she remains hypertensive as well, will restart home Diovan at reduced dose 2. Pulm- no acute issues, off oxygen, continue IS 3. Renal- creatinine stable, weight is below admission on Lasix 4. Hypokalemia improved at 4.0, continue potassium at 20 meq daily 5. Left Arm Phlebitis- likely due to IV Amiodarone infusion, currently getting relief with ice, she is afebrile, however with valve prosthesis in place may want to treat with prophylactic antibiotics will discuss with Dr. Laneta Simmers 6. Dispo- patient stable, in junctional rhythm with rate in the 90s, Digoxin added last night, continue Lopressor, Amiodarone, will restart diovan at reduced dose for better BP control, hypokalemia resolved, new left arm phlebitis due to IV amiodarone infusion.. ? Antibiotics? Will discuss with Dr. Laneta Simmers   LOS: 5 days    Lowella Dandy, PA-C 08/01/2020   Chart reviewed, patient examined, agree with above. She is doing well overall but rhythm is still perplexing. She was in sinus postop but on POD 2 developed tachycardia 120's that was read on monitor as junctional but with hx of atrial flutter in past ( s/p ablation) and postop state she was started on amio in the middle of the night. When I saw  her yesterday am HR was 120's but fairly regular and no clear flutter waves. I tried to RAP unsuccessfully. She was on amio and Lopressor and we increased Lopressor dose which did not slow her down. I started digoxin yesterday and after IV load of 1 mg she slowed to the 90's. This am it looks intermittently sinus but I am concerned about CHB with junctional tachycardia. EP consulted to see what they think and whether we should continue present meds.   She developed phlebitis of left antecubital vein from amio. Treating with ice and Keflex.

## 2020-08-01 NOTE — Consult Note (Addendum)
Cardiology Consultation:   Patient ID: Rebecca Farley MRN: [id number]; DOB: 1956-07-04  Admit date: 07/27/2020 Date of Consult: 08/01/2020  Primary Care Provider: Lavone Orn, MD [facility] Cardiologist: Larae Grooms, MD  [facility] Electrophysiologist:  None    Patient Profile:   Rebecca Farley is a 64 y.o. female with a hx of AFlutter (ablated when living in [place] 2009), HLD, HTN, DJD, VHD w/severe AS who is being seen today for the evaluation of arrhythmia/possible accelerated junctional rhythm s/p AVR at the request of Dr. Cyndia Bent.  History of Present Illness:   Rebecca Farley was admitted 07/27/2020 for AVR, underwent her surgery using 23 mm [place] Resilia Inspiris.  Extubated same day, ambulating POD 1 Post op in SR, lopressor resumed 10/23. 10/24 CT rounding note mention AFib and started on amiodarone gtt and BN increased, yesterday suspect to have accelerated junctional rhythm.  She was hypertensive and BB titrated and transitioned to oral amio Dr. Cyndia Bent mentions rates 120;s and tried to overdrive A pace without success.  EP is asked to weigh in today for the same.  LABS K+ 4.0 Last mag on 10/22 was 2.0 BUN/Creat 15/0.80 WBC 11.4 H/H 9.5/29.2 Plts 175  MEDS Amiodarone 423m BID transitioned from IV yesterday (was on gtt just less then 24hrs) Dig 0.125 started today Metoprolol started 10/21 totrated since then to current dose of 521mBID   She feels pretty well,  Not quite got all her strength back but feels like she is doing well, a little winded still with exertion.  No CP, no palpitations.  Past Medical History:  Diagnosis Date  . [place]   RADIOFREQUENCY ABLATION [place]  . Barrett esophagus   . Bee sting allergy   . Diverticulitis   . DJD (degenerative joint disease) of knee    KNEES, BACK (LUMBAR FACET DISEASE) AND HIPS, DALLDORF.  . Marland KitchenERD (gastroesophageal reflux disease)   . Hiatal hernia   . History  of syncope    RECURRENT  . Hyperlipidemia    BORDERLINE  . Hypertension   . Migraines   . Obesity   . Osteopenia   . S/P RF ablation operation for arrhythmia   . Severe aortic stenosis   . Shoulder tendinitis, left 2008    Past Surgical History:  Procedure Laterality Date  . ABDOMINAL HYSTERECTOMY    . AORTIC VALVE REPLACEMENT N/A 07/27/2020   Procedure: AORTIC VALVE REPLACEMENT (AVR) USING 23 MM INSPIRIS RESILIA  AORTIC VALVE;  Surgeon: BaGaye PollackMD;  [place];  Laterality: N/A;  . BREAST BIOPSY Left    10 yrs +  . CESAREAN SECTION     1979, 1984  . CHOLECYSTECTOMY    . COLONOSCOPY  11/03/2012   Procedure: COLONOSCOPY;  Surgeon: MaGarlan FairMD;  Location: [facility];  Service: Endoscopy;  Laterality: N/A;  . ESOPHAGOGASTRODUODENOSCOPY  11/03/2012   Procedure: ESOPHAGOGASTRODUODENOSCOPY (EGD);  Surgeon: MaGarlan FairMD;  [facility];  Service: Endoscopy;  Laterality: N/A;  . implanted loop device    . RIGHT HEART CATH AND CORONARY ANGIOGRAPHY N/A 07/07/2020   Procedure: RIGHT HEART CATH AND CORONARY ANGIOGRAPHY;  Surgeon: CoSherren MochaMD;  [place] [facility];  Service: Cardiovascular;  Laterality: N/A;  . TEE WITHOUT CARDIOVERSION N/A 07/27/2020   Procedure: TRANSESOPHAGEAL ECHOCARDIOGRAM (TEE);  Surgeon: BaGaye PollackMD;  [facility]: [facility];  Laterality: N/A;  .  TOTAL HIP ARTHROPLASTY Right 11/23/2019   Procedure: RIGHT TOTAL HIP ARTHROPLASTY ANTERIOR APPROACH;  Surgeon: Melrose Nakayama, MD;  Location: [facility];  Service: Orthopedics;  Laterality: Right;     Home Medications:  Prior to Admission medications   Medication Sig Start Date End Date Taking? Authorizing Provider  Ascorbic Acid (VITAMIN C WITH ROSE HIPS) 500 MG tablet Take 500 mg by mouth daily.   Yes [provider]  b complex vitamins tablet Take 1 tablet by mouth daily.   Yes [provider]   Calcium Carb-Cholecalciferol (CALCIUM+D3 PO) Take 1 tablet by mouth daily.   Yes [provider]  folic acid (FOLVITE) 1 MG tablet Take 1 mg by mouth daily.   Yes [provider]  Galcanezumab-gnlm (EMGALITY) 120 MG/ML SOAJ Inject 120 mg into the skin every 30 (thirty) days. 06/15/20  Yes Melvenia Beam, MD  Ginkgo Biloba 120 MG TABS Take 120 mg by mouth daily.   Yes [provider]  Glucosamine-Chondroitin (COSAMIN DS PO) Take 1-2 tablets by mouth See admin instructions. Take 2 tablets by mouth in the morning & take 1 tablet by mouth at night.   Yes [provider]  Multiple Vitamin (MULTIVITAMIN WITH MINERALS) TABS tablet Take 1 tablet by mouth daily.   Yes [provider]  Multiple Vitamins-Minerals (EMERGEN-C IMMUNE PLUS) PACK Take 1 packet by mouth daily.   Yes [provider]  Omega-3 1000 MG CAPS Take 2,000 mg by mouth daily.   Yes [provider]  omeprazole (PRILOSEC) 20 MG capsule Take 20 mg by mouth daily before breakfast.    Yes [provider]  Polyethyl Glycol-Propyl Glycol (LUBRICANT EYE DROPS) 0.4-0.3 % SOLN Place 1 drop into both eyes 3 (three) times daily as needed (dry/irritated eyes.).   Yes [provider]  Rimegepant Sulfate (NURTEC) 75 MG TBDP Take 75 mg by mouth daily as needed. For migraines. Take as close to onset of migraine as possible. One daily maximum. Patient taking differently: Take 75 mg by mouth daily as needed (migraines). For migraines. Take as close to onset of migraine as possible. One daily maximum. 06/15/20  Yes Melvenia Beam, MD  Turmeric 500 MG TABS Take 1,000 mg by mouth daily.   Yes [provider]  valsartan-hydrochlorothiazide (DIOVAN-HCT) 160-12.5 MG tablet Take 1 tablet by mouth daily.  10/18/19  Yes [provider]  acetaminophen (TYLENOL) 500 MG tablet Take 500-1,000 mg by mouth every 6 (six) hours as needed (for pain.).    [provider]  B  Complex-C (B-COMPLEX WITH VITAMIN C) tablet Take 1 tablet by mouth daily. Patient not taking: Reported on 07/14/2020    [provider]  EPINEPHrine (EPIPEN 2-PAK) 0.3 mg/0.3 mL IJ SOAJ injection Inject 0.3 mg into the muscle as needed for anaphylaxis.    [provider]  metoprolol tartrate (LOPRESSOR) 50 MG tablet Take one tablet by mouth as directed prior to 10/6 CT scan Patient not taking: Reported on 07/14/2020 06/28/20   Sherren Mocha, MD    Inpatient Medications: Scheduled Meds: . amiodarone  400 mg Oral BID  . aspirin EC  325 mg Oral Daily  . cephALEXin  500 mg Oral Q8H  . digoxin  0.125 mg Oral Daily  . enoxaparin (LOVENOX) injection  40 mg Subcutaneous QHS  . ferrous [id number] C-folic acid  1 capsule Oral BID PC  . irbesartan  75 mg Oral Daily  . metoprolol tartrate  50 mg Oral BID  . pantoprazole  40 mg Oral QAC breakfast  . potassium chloride  20 mEq Oral BID  . sodium chloride flush  3 mL Intravenous Q12H   Continuous Infusions: . sodium chloride     PRN Meds: sodium chloride, acetaminophen, ondansetron **OR** ondansetron (ZOFRAN) IV, senna-docusate, sodium chloride flush, traMADol  Allergies:    Allergies  Allergen Reactions  . Mobic [Meloxicam] Shortness Of Breath  . Penicillins Anaphylaxis, Hives and Other (See Comments)    Has patient had a PCN reaction causing immediate rash, facial/tongue/throat swelling, SOB or lightheadedness with hypotension: No Has patient had a PCN reaction causing severe rash involving mucus membranes or skin necrosis:  No Has patient had a PCN reaction that required hospitalization No Has patient had a PCN reaction occurring within the last 10 years: No If all of the above answers are "NO", then may proceed with Cephalosporin use.  . Codeine     STOMACH SICKNESS  . Nsaids Itching and Nausea And Vomiting    Social History:   Social History   Socioeconomic History  . Marital status: Widowed    Spouse  name: Not on file  . Number of children: Not on file  . Years of education: Not on file  . Highest education level: Not on file  Occupational History  . Not on file  Tobacco Use  . Smoking status: Never Smoker  . Smokeless tobacco: Never Used  Vaping Use  . Vaping Use: Never used  Substance and Sexual Activity  . Alcohol use: Yes    Comment: OCCASIONALY  . Drug use: No  . Sexual activity: Not on file  Other Topics Concern  . Not on file  Social History Narrative  . Not on file   Social Determinants of Health   Financial Resource Strain:   . Difficulty of Paying Living Expenses: Not on file  Food Insecurity:   . Worried About Charity fundraiser in the Last Year: Not on file  . Ran Out of Food in the Last Year: Not on file  Transportation Needs:   . Lack of Transportation (Medical): Not on file  . Lack of Transportation (Non-Medical): Not on file  Physical Activity:   . Days of Exercise per Week: Not on file  . Minutes of Exercise per Session: Not on file  Stress:   . Feeling of Stress : Not on file  Social Connections:   . Frequency of Communication with Friends and Family: Not on file  . Frequency of Social Gatherings with Friends and Family: Not on file  . Attends Religious Services: Not on file  . Active Member of Clubs or Organizations: Not on file  . Attends Archivist Meetings: Not on file  . Marital Status: Not on file  Intimate Partner Violence:   . Fear of Current or Ex-Partner: Not on file  . Emotionally Abused: Not on file  . Physically Abused: Not on file  . Sexually Abused: Not on file    Family History:   Family History  Problem Relation Age of Onset  . Hypertension Mother   . Heart failure Mother   . Aneurysm Mother   . Kidney failure Mother   . Emphysema Father   . Prostate cancer Father   . Lung cancer Father   . Colon cancer Sister 7       dx 3 times; Lynch syndrome (PMS2)  . Melanoma Sister 61  . Other Sister        genetic  testing - PMS2+,  BRIP1+, MITF+  . Melanoma Sister   . Other Sister        genetic testing - PMS2+  . Idiopathic pulmonary fibrosis Brother   . Lung cancer Maternal Uncle   . Liver cancer Maternal Uncle   . Lung cancer Paternal Uncle   . Ovarian cancer Paternal Grandmother        died early 40s  . Other Sister        genetic testing - BRIP1+, PMS2-, MITF-  . Other Sister        genetic testing - BRIP1+, MITF+, PMS2-  . Colon cancer Brother 53     ROS:  Please see the history of present illness.  All other ROS reviewed and negative.     Physical Exam/Data:   Vitals:   08/01/20 0405 08/01/20 0737 08/01/20 0932 08/01/20 1145  BP:  (!) 148/78  124/77  Pulse:  98 100   Resp:  13  20  Temp:  98.1 F (36.7 C)  97.6 F (36.4 C)  TempSrc:  Oral  Oral  SpO2:  97%    Weight: 87.1 kg      No intake or output data in the 24 hours ending 08/01/20 1148 Last 3 Weights 08/01/2020 07/31/2020 07/30/2020  Weight (lbs) 192 lb 0.3 oz 195 lb 1.7 oz 197 lb 15.6 oz  Weight (kg) 87.1 kg 88.5 kg 89.8 kg  Some encounter information is confidential and restricted. Go to Review Flowsheets activity to see all data.     Body mass index is 31.95 kg/m.  General:  Well nourished, well developed, in no acute distress HEENT: normal Lymph: no adenopathy Neck: no JVD Endocrine:  No thryomegaly Vascular: No carotid bruits Cardiac: RRR; no murmurs, gallops or rubs Lungs:  CTA b/l, no wheezing, rhonchi or rales  Abd: soft, nontender, no hepatomegaly  Ext: no edema Musculoskeletal:  No deformities Skin: warm and dry  Neuro:  no focal abnormalities noted Psych:  Normal affect   EKG:  The EKG was personally reviewed and demonstrates:    07/27/20: SR, 65bpm, 1st degree AVBlock, 235m 07/28/20 ST 74, , PR 1459m , inf T changes, unclear significant, Q wave (from a QS on prior) 08/01/20 looks junctional, slightly longer QRS duration, though no change in axis,   OLD 07/25/2020 SR 71bpm, normal  intervals  Telemetry:  Telemetry was personally reviewed and demonstrates:   Rates generally 80's-100's NSR as well as times appears long RP rhythm, AR rates appear same as V, ? Is varying degree of 1st AVBlock?  Vs accelerated junctional   Relevant CV Studies:   07/27/2020: Interop TEE PRE-OP FINDINGS  Left Ventricle: The left ventricle has hyperdynamic systolic function.  The cavity size was normal. The LV systolic function was vigorous. The LV  ejection fraction was estimated at 65-70% using the Simpson's method of  disks in the 4 chamber and two  chamber views. There was mild concentric left ventricular hypertrophy. The  LV wall thickness was 1.1 cm. There was an area of increased LV wall  thickness measuring 1.54 cm in the basal septum just proximal to the LV  outflow tract. The was no evidence of  LV outflow tract obstruction on the prebypass or post-bypass views.   Right Ventricle: The right ventricle has normal systolic function. The  cavity was normal. There is no increase in right ventricular wall  thickness.   Left Atrium: Left atrial size was normal in size. The left atrial  appendage is well visualized and  there is evidence of thrombus present.   Right Atrium: Right atrial size was normal in size.   Interatrial Septum: No atrial level shunt detected by color flow Doppler.   Pericardium: There is no evidence of pericardial effusion.   Mitral Valve: The mitral valve leaflets were thickend with calcification  noted at the base of the anterior leaflet and focal calcification on the  more distal leaflet surface. The lealets opened normally and there was  trace mitral insufficiency.   Tricuspid Valve: The tricuspid valve was normal in structure. Tricuspid  valve regurgitation is trivial by color flow Doppler.   Aortic Valve: There was severe aortic stenosis. The aortic valve leafets  were heavily calcified with severe restriction to opening.  The Aortic Valve Vmax  was 4.28 m/sec. The mean gradient was 45 mm hg, the  AVA by VTI was 0.84 cm2 and 0.94 cm2 by Vmax. The aortic valve velocity  ratio was 0.26.   There was mild aortic insufficiency.   On the post-bypass exam, there was a bioprosthetic valve in the aortic  position. The leaflets opened normally and there was no aortic  insufficiency seen. The mean trans-aortic velocity was 9 mm hg.   Pulmonic Valve: The pulmonic valve was normal in structure, with normal.  Pulmonic valve regurgitation is trivial by color flow Doppler.  Aorta: There was a well-defined aortic root and sino-tubular junction  without dilatation or effacement. There was calcification noted in the  right sinus of valsalva and at the base of the non-coronary sinus of  valsalva. The aortic root dimensions were  normal. The aortic annulus measured 21 mm, the aortic root at the sinuses  of valsalva was 2.35 cm and the sinotubular junction was 2.1 cm. The  proximal ascending aorta was 3.4 cm at its largest point. There was  intimal thickening present but no  protruding atherosclerotic plaques noted.   The descending aorta was normal in diameter with scatterd grade 3-4  atherosclerotic plaques present.    07/07/2020: LHC 1. Known severe aortic stenosis by noninvasive assessment. Heavily calcified, restricted aortic valve by plain fluoroscopy 2. Patent coronary arteries with very mild nonobstructive irregularity in the RCA and LAD, no significant stenosis 3. Normal right heart pressures    06/08/2020: TTE IMPRESSIONS  1. Since the last study on 04/14/2019 there has been a significant change -  aortic stenosis is now critical with mean gradient 62 mmHg.  2. Left ventricular ejection fraction, by estimation, is 60 to 65%. The  left ventricle has normal function. The left ventricle has no regional  wall motion abnormalities. There is moderate concentric left ventricular  hypertrophy. Left ventricular  diastolic parameters are  consistent with Grade I diastolic dysfunction  (impaired relaxation). The average left ventricular global longitudinal  strain is -22.0 %. The global longitudinal strain is normal.  3. Right ventricular systolic function is normal. The right ventricular  size is normal.  4. The mitral valve is normal in structure. Mild mitral valve  regurgitation. No evidence of mitral stenosis.  5. The aortic valve is normal in structure. Aortic valve regurgitation is  mild. Critical aortic stenosis. Aortic valve mean gradient measures 62.0  mmHg.  6. The inferior vena cava is normal in size with greater than 50%  respiratory variability, suggesting right atrial pressure of 3 mmHg.   Laboratory Data:  High Sensitivity Troponin:  No results for input(s): TROPONINIHS in the last 720 hours.   Chemistry Recent Labs  Lab 07/29/20 0524 07/31/20 0350 08/01/20  0214  NA 136 139 137  K 3.6 3.3* 4.0  CL 101 103 104  CO2 _0 GLUCOSE 126* 110* 127*  BUN _1 CREATININE 0.67 0.69 0.80  CALCIUM 8.0* 8.0* 8.4*  GFRNONAA >60 >60 >60  ANIONGAP _2 No results for input(s): PROT, ALBUMIN, AST, ALT, ALKPHOS, BILITOT in the last 168 hours. Hematology Recent Labs  Lab 07/28/20 1550 07/29/20 0524 07/31/20 0350  WBC 14.2* 8.8 11.4*  RBC 3.00* 2.97* 3.15*  HGB 9.2* 8.9* 9.5*  HCT 27.7* 27.7* 29.2*  MCV 92.3 93.3 92.7  MCH 30.7 30.0 30.2  MCHC 33.2 32.1 32.5  RDW 13.8 13.9 14.6  PLT 103* 94* 175   BNPNo results for input(s): BNP, PROBNP in the last 168 hours.  DDimer No results for input(s): DDIMER in the last 168 hours.   Radiology/Studies:  DG Chest 2 View  Result Date: 07/31/2020 CLINICAL DATA:  Aortic valve replacement with pleural effusions EXAM: CHEST - 2 VIEW COMPARISON:  Two days ago FINDINGS: Pleural effusions and atelectasis without interval change. Stable postoperative heart size. Implantable loop recorder in place. No visible pneumothorax IMPRESSION: Atelectasis and  small pleural effusions, similar to prior. Electronically Signed   By: Monte Fantasia M.D.   On: 07/31/2020 06:43   DG Chest Port 1 View  Result Date: 07/29/2020 CLINICAL DATA:  Status post aortic valve replacement. EXAM: PORTABLE CHEST 1 VIEW COMPARISON:  July 28, 2020. FINDINGS: Stable cardiomegaly. Status post aortic valve repair. Swan-Ganz catheter has been removed. Right internal jugular venous sheath remains. No pneumothorax is noted. Mild bibasilar subsegmental atelectasis and small pleural effusions are present. Bony thorax is unremarkable. IMPRESSION: Mild bibasilar subsegmental atelectasis and small pleural effusions. Swan-Ganz catheter has been removed. Electronically Signed   By: Marijo Conception M.D.   On: 07/29/2020 08:39     Assessment and Plan:   1. Post AVR (POD #4 today)  I am not entirely certain of her rhythm intermittently does appear to be accelerated junctional, vs  long RP  At times seems perhaps variable degree of 1st degree AVBlock? No bradycardia is appreciated and do not think right now there is role for pacing.  Preserved LVEF with no CAD I will review EKGs and tele with Dr. Rayann Heman He will see later today  For questions or updates, please contact [facility] Please consult [email or web address] for contact info under    Signed, Baldwin Jamaica, PA-C  08/01/2020 11:48 AM    I have seen, examined the patient, and reviewed the above assessment and plan.  Changes to above are made where necessary.  On exam, tachycardic rhythm.  The patient has developed accelerated junctional rhythm with likely a firing automatic focus near the AV node.  I do not believe that she has advanced conduction system disease or risks of AV block, though she does have occasional VA dissociation when ventricular rates exceed atrial rates. I feel that this rhythm will likely resolve on its own with time. I do not feel that pacing or additional inpatient EP workup is  advised. Hopefully, we can eventually wean her off of digoxin/ amiodarone.  She should follow-up with EP PA 2 weeks post discharge.   Co Sign: Thompson Grayer, MD 08/01/2020 9:08 PM

## 2020-08-01 NOTE — Progress Notes (Signed)
CARDIAC REHAB PHASE I   Went to offer to walk with pt. Pt states she has been walking independently with daughter. Denies pain, SOB, or dizziness. Pt states she has a walker and BSC for home use, denies other DME needs. Encouraged continued ambulation and IS use. Will continue to follow.  [id number] Reynold Bowen, RN BSN 08/01/2020 11:37 AM

## 2020-08-02 MED ORDER — AMIODARONE HCL 200 MG PO TABS
200.0000 mg | ORAL_TABLET | Freq: Two times a day (BID) | ORAL | Status: DC
  Administered 2020-08-02 – 2020-08-03 (×2): 200 mg via ORAL
  Filled 2020-08-02 (×2): qty 1

## 2020-08-02 MED ORDER — ASPIRIN 325 MG PO TBEC
325.0000 mg | DELAYED_RELEASE_TABLET | Freq: Every day | ORAL | 0 refills | Status: DC
Start: 2020-08-03 — End: 2021-07-11

## 2020-08-02 NOTE — Discharge Summary (Signed)
[address]       [place] [address]             [phone]    Physician Discharge Summary  Patient ID: Rebecca Farley MRN: [id number] DOB/AGE: [id number] y.o.  Admit date: 07/27/2020 Discharge date: 08/03/2020  Admission Diagnoses:  Patient Active Problem List   Diagnosis Date Noted  . Severe aortic stenosis 07/07/2020  . Hypertension   . Hyperlipidemia   . History of syncope   . GERD (gastroesophageal reflux disease)   . Iasia Forcier esophagus   . S/P RF ablation operation for arrhythmia   . Chronic migraine without aura, with intractable migraine, so stated, with status migrainosus 06/18/2020  . Primary localized osteoarthritis of right hip 11/23/2019  . Primary osteoarthritis of right hip 11/23/2019  . Genetic testing 06/01/2019  . 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 Family history of colon cancer   . Family history of melanoma   . Family history of ovarian cancer   . Family history of lung cancer   . Family history of prostate cancer   . Family history of liver cancer   . Family history of carrier of genetic disease   . Atrial flutter ([place]) 01/2008   Discharge Diagnoses:   Patient Active Problem List   Diagnosis Date Noted  . S/P AVR (aortic valve replacement) 07/27/2020  . Severe aortic stenosis 07/07/2020  . Hypertension   . Hyperlipidemia   . History of syncope   . GERD (gastroesophageal reflux disease)   . Damari Suastegui esophagus   . S/P RF ablation operation for arrhythmia   . Chronic migraine without aura, with intractable migraine, so stated, with status migrainosus 06/18/2020  . Primary localized osteoarthritis of right hip 11/23/2019  . Primary osteoarthritis of right hip 11/23/2019  . Genetic testing 06/01/2019  . 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 Family history of colon cancer   . Family history of melanoma   . Family history of ovarian cancer   . Family history of lung cancer   . Family history of  prostate cancer   . Family history of liver cancer   . Family history of carrier of genetic disease   . Atrial flutter ([place]) 01/2008   Discharged Condition: good  History of Present Illness:  Rebecca Farley is a 64 year old woman with history of hypertension, hyperlipidemia, atrial flutter status post RF ablation in the past in [place], family history of aortic valve disease, and known heart murmur for over 10 years.  She presented with complaints of exertional shortness of breath and now fatigue that had been occurring over the past 6 months.  The patient also experienced some chest discomfort and cough neither of which were associated with exertion.  She has been followed by Dr. Scarlette Calico.  Previous Echocardiogram obtained in 2020 showed aortic valve stenosis with mean gradient of 38 mmHg across the aortic valve with a peak gradient of 70 mmHg.  Her valve area was 0.89 cm.  Repeat Echocardiogram obtained in September showed progression to critical aortic stenosis.  Due to this she underwent cardiac catheterization which showed non obstructive CAD.  It was felt the patient would require surgical aortic valve replacement.  She was referred to [facility] and evaluated by Dr. Cyndia Bent at which time she re-iterated the above mentioned symptoms.  She states just getting up and getting ready for work is extremely fatiguing.  It was felt with her young age she  would benefit from conventional surgical Aortic Valve Replacement.  The risks and benefits of the procedure were explained to the patient and she was agreeable to proceed.    Hospital Course:    Rebecca Farley presented to [facility] on 07/27/2020.  She was taken to the operating room and underwent Aortic Valve Replacement with a 23 mm [employer] Resilia Pericardial Tissue Valve.  She tolerated the procedure without difficulty and was taken to the SICU in stable condition.  She was extubated the evening of surgery without difficulty.  She  developed expected post operative blood loss anemia and was transfused a unit of packed blood cells.  During her stay in the SICU the patient was weaned off Neo-synephrine as blood pressure allowed.  Her chest tubes and arterial lines were removed without difficulty.  She was started on Lasix for volume overloaded state.  She developed Hypokalemia and was supplemented accordingly.  She was maintaining NSR and was started on low dose Metoprolol.  She was transferred to the progressive care unit on 07/29/2020.  She developed Atrial Fibrillation with RVR.  Her Metoprolol dose was increased.  She was started on IV Amiodarone and converted into Sinus Tachycardia which was junctional at times.  There was no evidence of atrial flutter waves.  Bedside rapid atrial pacing was attempted and was unsuccessful.  Her Lopressor dose was again increased for additional HR control.  EKG was obtained and showed accelerated junctional rhythm.  She was started on Digoxin for additional rate control.  EP consult was obtained to ensure there was no CHB present.  They felt the patients rhythm was junctional and should recover with time.  They did not feel PPM would be indicated.  They also recommended tapering of Amiodarone and Digoxin as an outpatient as her heart rhythm recovers. She remained hypertensive and was restarted on her home regimen of Diovan at a reduced dose.  She developed a left AC phlebitis and was started on Keflex.  Her pacing wires were removed without difficulty.  She is ambulating independently.  Her rhythm has been stable, remains junctional in the 90s.  Her incisions are healing without evidence of infection.  Her phlebitis is improving.  She is medically stable for discharge home today.  Consults: EP  Significant Diagnostic Studies: cardiac graphics:   Echocardiogram:   1. Since the last study on 04/14/2019 there has been a significant change -  aortic stenosis is now critical with mean gradient 62 mmHg.   2. Left ventricular ejection fraction, by estimation, is 60 to 65%. The  left ventricle has normal function. The left ventricle has no regional  wall motion abnormalities. There is moderate concentric left ventricular  hypertrophy. Left ventricular  diastolic parameters are consistent with Grade I diastolic dysfunction  (impaired relaxation). The average left ventricular global longitudinal  strain is -22.0 %. The global longitudinal strain is normal.  3. Right ventricular systolic function is normal. The right ventricular  size is normal.  4. The mitral valve is normal in structure. Mild mitral valve  regurgitation. No evidence of mitral stenosis.  5. The aortic valve is normal in structure. Aortic valve regurgitation is  mild. Critical aortic stenosis. Aortic valve mean gradient measures 62.0  mmHg.  6. The inferior vena cava is normal in size with greater than 50%  respiratory variability, suggesting right atrial pressure of 3 mmHg.   Treatments: surgery:   Procedure:  1. Median Sternotomy 2. Extracorporeal circulation 3.   Aortic valve replacement using  a 23 mm Edwards INSPIRIS RESILIA valve.  Discharge Exam: Blood pressure 138/73, pulse 79, temperature 98.4 F (36.9 C), temperature source Oral, resp. rate (!) 21, weight 86.6 kg, SpO2 96 %.  General appearance: alert, cooperative and no distress Heart: regular rate and rhythm Lungs: clear to auscultation bilaterally Abdomen: soft, non-tender; bowel sounds normal; no masses,  no organomegaly Extremities: edema none Wound: clean and dry, improvement in left AC phlebitis  Discharge Medications:  The patient has been discharged on:   1.Beta Blocker:  Yes [ X  ]                              No   [   ]                              If No, reason:  2.Ace Inhibitor/ARB: Yes [ X  ]                                     No  [    ]                                     If No, reason:  3.Statin:   Yes [   ]                   No  [ X  ]                  If No, reason: No CAD, No Hyperlipidemia  4.Shela CommonsVelta Addison  [ X  ]                  No   [   ]                  If No, reason:   Discharge Instructions    Amb Referral to Cardiac Rehabilitation   Complete by: As directed    Diagnosis: Valve Replacement   Valve: Aortic   After initial evaluation and assessments completed: Virtual Based Care may be provided alone or in conjunction with Phase 2 Cardiac Rehab based on patient barriers.: Yes     Allergies as of 08/03/2020      Reactions   Mobic [meloxicam] Shortness Of Breath   Penicillins Anaphylaxis, Hives, Other (See Comments)   Has patient had a PCN reaction causing immediate rash, facial/tongue/throat swelling, SOB or lightheadedness with hypotension: No Has patient had a PCN reaction causing severe rash involving mucus membranes or skin necrosis:  No Has patient had a PCN reaction that required hospitalization No Has patient had a PCN reaction occurring within the last 10 years: No If all of the above answers are "NO", then may proceed with Cephalosporin use.   Codeine    STOMACH SICKNESS   Nsaids Itching, Nausea And Vomiting      Medication List    TAKE these medications   acetaminophen 500 MG tablet Commonly known as: TYLENOL Take 500-1,000 mg by mouth every 6 (six) hours as needed (for pain.).   amiodarone 200 MG tablet Commonly known as: PACERONE Take 1 tablet (200 mg total) by mouth 2 (two) times daily. X 7 days, then decrease to 200  mg daily   aspirin 325 MG EC tablet Take 1 tablet (325 mg total) by mouth daily.   b complex vitamins tablet Take 1 tablet by mouth daily.   B-complex with vitamin C tablet Take 1 tablet by mouth daily.   CALCIUM+D3 PO Take 1 tablet by mouth daily.   cephALEXin 500 MG capsule Commonly known as: KEFLEX Take 1 capsule (500 mg total) by mouth every 8 (eight) hours.   COSAMIN DS PO Take 1-2 tablets by mouth See admin instructions. Take 2 tablets by  mouth in the morning & take 1 tablet by mouth at night.   digoxin 0.125 MG tablet Commonly known as: LANOXIN Take 1 tablet (0.125 mg total) by mouth daily.   Emergen-C Immune Plus Pack Take 1 packet by mouth daily.   Emgality 120 MG/ML Soaj Generic drug: Galcanezumab-gnlm Inject 120 mg into the skin every 30 (thirty) days.   EpiPen 2-Pak 0.3 mg/0.3 mL Soaj injection Generic drug: EPINEPHrine Inject 0.3 mg into the muscle as needed for anaphylaxis.   folic acid 1 MG tablet Commonly known as: FOLVITE Take 1 mg by mouth daily.   Ginkgo Biloba 120 MG Tabs Take 120 mg by mouth daily.   Lubricant Eye Drops 0.4-0.3 % Soln Generic drug: Polyethyl Glycol-Propyl Glycol Place 1 drop into both eyes 3 (three) times daily as needed (dry/irritated eyes.).   metoprolol tartrate 50 MG tablet Commonly known as: LOPRESSOR Take 1 tablet (50 mg total) by mouth 2 (two) times daily. What changed:   how much to take  how to take this  when to take this  additional instructions   multivitamin with minerals Tabs tablet Take 1 tablet by mouth daily.   Nurtec 75 MG Tbdp Generic drug: Rimegepant Sulfate Take 75 mg by mouth daily as needed. For migraines. Take as close to onset of migraine as possible. One daily maximum. What changed: reasons to take this   Omega-3 1000 MG Caps Take 2,000 mg by mouth daily.   omeprazole 20 MG capsule Commonly known as: PRILOSEC Take 20 mg by mouth daily before breakfast.   traMADol 50 MG tablet Commonly known as: ULTRAM Take 1 tablet (50 mg total) by mouth every 6 (six) hours as needed for moderate pain.   Turmeric 500 MG Tabs Take 1,000 mg by mouth daily.   valsartan-hydrochlorothiazide 160-12.5 MG tablet Commonly known as: DIOVAN-HCT Take 1 tablet by mouth daily.   vitamin C with rose hips 500 MG tablet Take 500 mg by mouth daily.       Follow-up Information    Gaye Pollack, MD Follow up on 08/23/2020.   Specialty: Cardiothoracic  Surgery Why: Appointment is at 2:00, please get CXR at 1:30 at [facility] located on first floor of our office building Contact information: [place] [address] [phone]        [facility] Follow up on 08/10/2020.   Specialty: Cardiothoracic Surgery Why: Appointment is at 25 with nurse for suture removal Contact information: [place], [place] [phone]       Jettie Booze, MD Follow up on 08/17/2020.   Specialties: Cardiology, Radiology, Interventional Cardiology Why: Appointment is at 9:20 Contact information: [address] [place] [address] [phone]        Shirley Friar, PA-C Follow up.   Specialty: Physician Assistant Why: 08/17/2020 @ 10:05AM , re-evaluate heart rhythm Contact information: [address] [place] [address] [phone]               Signed: Ellwood Handler, PA-C 08/03/2020, 7:54 AM

## 2020-08-02 NOTE — Plan of Care (Signed)
Continue to monitor

## 2020-08-02 NOTE — Progress Notes (Signed)
Mobility Specialist - Progress Note   08/02/20 1344  Mobility  Activity Ambulated in hall  Level of Assistance Independent  Assistive Device None  Distance Ambulated (ft) 500 ft  Mobility Response Tolerated well  Mobility performed by Mobility specialist  $Mobility charge 1 Mobility    Pre-mobility: 74 HR During mobility: 86 HR, 100% SpO2 Post-mobility: 81 HR  Pt said she felt more steady on her feet during this walk. She required one brief rest break at ~250 ft due to feeling SOB, her SpO2 was 100%. Pt says she has been using her IS throughout the day.   Mamie Levers Mobility Specialist Mobility Specialist Phone: [phone]

## 2020-08-02 NOTE — Progress Notes (Signed)
PT EPW wires removed per order. Pt tolerated well. Vitals monitor 15 min x1 hr. Pt agrees to1 hr of bedrest. Will continue to monitor. Lacy Duverney, RN

## 2020-08-02 NOTE — Progress Notes (Addendum)
      [address]       [place] [address]             [phone]      6 Days Post-Op Procedure(s) (LRB): AORTIC VALVE REPLACEMENT (AVR) USING 23 MM INSPIRIS RESILIA  AORTIC VALVE (N/A) TRANSESOPHAGEAL ECHOCARDIOGRAM (TEE) (N/A)   Subjective:  No new complaints.  Up in chair.  States her left arm feels a little bit better.  Objective: Vital signs in last 24 hours: Temp:  [97.6 F (36.4 C)-98.6 F (37 C)] 98.4 F (36.9 C) (10/27 0420) Pulse Rate:  [87-100] 90 (10/26 2341) Cardiac Rhythm: Junctional rhythm (10/26 1948) Resp:  [13-20] 19 (10/27 0420) BP: (116-129)/(60-77) 116/70 (10/27 0420) SpO2:  [96 %-98 %] 96 % (10/27 0420) Weight:  [86.9 kg] 86.9 kg (10/27 0423)  General appearance: alert, cooperative and no distress Heart: regular rate and rhythm Lungs: clear to auscultation bilaterally Abdomen: soft, non-tender; bowel sounds normal; no masses,  no organomegaly Extremities: trace edema, Left AC not as swollen, erythema persists, warm to touch Wound: clean and dry  Lab Results: Recent Labs    07/31/20 0350  WBC 11.4*  HGB 9.5*  HCT 29.2*  PLT 175   BMET:  Recent Labs    07/31/20 0350 08/01/20 0214  NA 139 137  K 3.3* 4.0  CL 103 104  CO2 26 22  GLUCOSE 110* 127*  BUN 8 15  CREATININE 0.69 0.80  CALCIUM 8.0* 8.4*    PT/INR: No results for input(s): LABPROT, INR in the last 72 hours. ABG    Component Value Date/Time   PHART 7.347 (L) 07/27/2020 1713   HCO3 24.1 07/27/2020 1713   TCO2 25 07/27/2020 1713   ACIDBASEDEF 2.0 07/27/2020 1713   O2SAT 97.0 07/27/2020 1713   CBG (last 3)  No results for input(s): GLUCAP in the last 72 hours.  Assessment/Plan: S/P Procedure(s) (LRB): AORTIC VALVE REPLACEMENT (AVR) USING 23 MM INSPIRIS RESILIA  AORTIC VALVE (N/A) TRANSESOPHAGEAL ECHOCARDIOGRAM (TEE) (N/A)  1. CV- Junctional rhythm, occasional PVCs, HTN improved- rate in the 90s- on Amiodarone, Digoxin, Lopressor, Diovan...  appreciate EP input, can hopefully remove EPW today 2. Pulm- no acute issues, off oxygen 3. Renal- weight is stable, below baseline, no edema on exam.. no Lasix at this time 4. Left AC Phlebitis- from IV site, swelling improved, a little more warmth to touch today, monitor, continue Keflex 5. Dispo- patient stable, EP evaluated no indication for PPM at time, they are hopeful her rhythm will improve with time, continue current medication regimen, continue Keflex for phlebitis, weight remains stable, possibly d/c EPW if okay with Dr. Laneta Simmers   LOS: 6 days    Lowella Dandy, PA-C 08/02/2020  Chart reviewed, patient examined, agree with above. Will decrease amio to 200 bid. Continue Lopressor and digoxin. DC pacing wires and plan home tomorrow if stable. Her left arm phlebitis looks stable. Continue Keflex for 1 week in addition to local care.

## 2020-08-02 NOTE — Progress Notes (Addendum)
CARDIAC REHAB PHASE I   PRE:  Rate/Rhythm: 82 ? junctional  BP:  Supine: 111/71  Sitting:   Standing:    SaO2: 93%RA  MODE:  Ambulation: 470 ft   POST:  Rate/Rhythm: 88 ? junctional   BP:  Supine:   Sitting: 121/71  Standing:    SaO2: 99%RA 1126-1150 Reviewed ed with pt and daughter who voiced understanding. Assisted pt to bathroom and then we walked 470 ft on RA. Pt a little SOB. Stopped once to rest at end of hall. Pt did not need walker. Hand held asst and gait steady. To recliner after walk. Sats at 99-100%.    Luetta Nutting, RN BSN  08/02/2020 11:49 AM

## 2020-08-03 ENCOUNTER — Encounter (HOSPITAL_COMMUNITY): Payer: Self-pay | Admitting: Surgery

## 2020-08-03 ENCOUNTER — Other Ambulatory Visit (HOSPITAL_COMMUNITY): Payer: Self-pay | Admitting: Physician Assistant

## 2020-08-03 MED ORDER — CEPHALEXIN 500 MG PO CAPS
500.0000 mg | ORAL_CAPSULE | Freq: Three times a day (TID) | ORAL | 0 refills | Status: DC
Start: 2020-08-03 — End: 2020-08-23

## 2020-08-03 MED ORDER — METOPROLOL TARTRATE 50 MG PO TABS
50.0000 mg | ORAL_TABLET | Freq: Two times a day (BID) | ORAL | 3 refills | Status: DC
Start: 2020-08-03 — End: 2020-09-05

## 2020-08-03 MED ORDER — AMIODARONE HCL 200 MG PO TABS
200.0000 mg | ORAL_TABLET | Freq: Two times a day (BID) | ORAL | 1 refills | Status: DC
Start: 2020-08-03 — End: 2021-05-30

## 2020-08-03 MED ORDER — DIGOXIN 125 MCG PO TABS
0.1250 mg | ORAL_TABLET | Freq: Every day | ORAL | 3 refills | Status: DC
Start: 2020-08-03 — End: 2021-05-30

## 2020-08-03 MED ORDER — TRAMADOL HCL 50 MG PO TABS: 50.0000 mg | ORAL_TABLET | Freq: Four times a day (QID) | ORAL | 0 refills | Status: DC | PRN

## 2020-08-03 MED FILL — CEPHALEXIN 500 MG CAPS: 500 | 7 days supply | Qty: 21 | Fill #0

## 2020-08-03 MED FILL — traMADol HCL 50 MG TABS: 50 | 7 days supply | Qty: 30 | Fill #0

## 2020-08-03 MED FILL — METOPROLOL TARTRATE 50 MG T: 50 | 30 days supply | Qty: 60 | Fill #0

## 2020-08-03 MED FILL — AMIODARONE HCL 200 MG TAB: 200 | 30 days supply | Qty: 37 | Fill #0

## 2020-08-03 MED FILL — DIGOXIN 0.125 MG TABLET: 125 | 30 days supply | Qty: 30 | Fill #0

## 2020-08-03 NOTE — Plan of Care (Signed)
  Problem: Education: Goal: Knowledge of General Education information will improve Description: Including pain rating scale, medication(s)/side effects and non-pharmacologic comfort measures Outcome: Completed/Met   Problem: Health Behavior/Discharge Planning: Goal: Ability to manage health-related needs will improve Outcome: Completed/Met  Patient discharged to home

## 2020-08-03 NOTE — Progress Notes (Signed)
Nsg Discharge Note  Admit Date:  07/27/2020 Discharge date: 08/03/2020   Rebecca Farley to be D/C'd Patient/caregiver able to verbalize understanding.  Discharge Medication: Allergies as of 08/03/2020      Reactions   Mobic [meloxicam] Shortness Of Breath   Penicillins Anaphylaxis, Hives, Other (See Comments)   Has patient had a PCN reaction causing immediate rash, facial/tongue/throat swelling, SOB or lightheadedness with hypotension: No Has patient had a PCN reaction causing severe rash involving mucus membranes or skin necrosis:  No Has patient had a PCN reaction that required hospitalization No Has patient had a PCN reaction occurring within the last 10 years: No If all of the above answers are "NO", then may proceed with Cephalosporin use.   Codeine    STOMACH SICKNESS   Nsaids Itching, Nausea And Vomiting      Medication List    TAKE these medications   acetaminophen 500 MG tablet Commonly known as: TYLENOL Take 500-1,000 mg by mouth every 6 (six) hours as needed (for pain.).   amiodarone 200 MG tablet Commonly known as: PACERONE Take 1 tablet (200 mg total) by mouth 2 (two) times daily. X 7 days, then decrease to 200 mg daily   aspirin 325 MG EC tablet Take 1 tablet (325 mg total) by mouth daily.   b complex vitamins tablet Take 1 tablet by mouth daily.   B-complex with vitamin C tablet Take 1 tablet by mouth daily.   CALCIUM+D3 PO Take 1 tablet by mouth daily.   cephALEXin 500 MG capsule Commonly known as: KEFLEX Take 1 capsule (500 mg total) by mouth every 8 (eight) hours.   COSAMIN DS PO Take 1-2 tablets by mouth See admin instructions. Take 2 tablets by mouth in the morning & take 1 tablet by mouth at night.   digoxin 0.125 MG tablet Commonly known as: LANOXIN Take 1 tablet (0.125 mg total) by mouth daily.   Emergen-C Immune Plus Pack Take 1 packet by mouth daily.   Emgality 120 MG/ML Soaj Generic drug: Galcanezumab-gnlm Inject 120 mg into the  skin every 30 (thirty) days.   EpiPen 2-Pak 0.3 mg/0.3 mL Soaj injection Generic drug: EPINEPHrine Inject 0.3 mg into the muscle as needed for anaphylaxis.   folic acid 1 MG tablet Commonly known as: FOLVITE Take 1 mg by mouth daily.   Ginkgo Biloba 120 MG Tabs Take 120 mg by mouth daily.   Lubricant Eye Drops 0.4-0.3 % Soln Generic drug: Polyethyl Glycol-Propyl Glycol Place 1 drop into both eyes 3 (three) times daily as needed (dry/irritated eyes.).   metoprolol tartrate 50 MG tablet Commonly known as: LOPRESSOR Take 1 tablet (50 mg total) by mouth 2 (two) times daily. What changed:   how much to take  how to take this  when to take this  additional instructions   multivitamin with minerals Tabs tablet Take 1 tablet by mouth daily.   Nurtec 75 MG Tbdp Generic drug: Rimegepant Sulfate Take 75 mg by mouth daily as needed. For migraines. Take as close to onset of migraine as possible. One daily maximum. What changed: reasons to take this   Omega-3 1000 MG Caps Take 2,000 mg by mouth daily.   omeprazole 20 MG capsule Commonly known as: PRILOSEC Take 20 mg by mouth daily before breakfast.   traMADol 50 MG tablet Commonly known as: ULTRAM Take 1 tablet (50 mg total) by mouth every 6 (six) hours as needed for moderate pain.   Turmeric 500 MG Tabs Take 1,000 mg  by mouth daily.   valsartan-hydrochlorothiazide 160-12.5 MG tablet Commonly known as: DIOVAN-HCT Take 1 tablet by mouth daily.   vitamin C with rose hips 500 MG tablet Take 500 mg by mouth daily.       Discharge Assessment: Vitals:   08/03/20 0755 08/03/20 0823  BP: (!) 151/87 119/84  Pulse: 85   Resp: 19 18  Temp: 98.3 F (36.8 C)   SpO2: 99%    Skin clean, dry and intact without evidence of skin break down, no evidence of skin tears noted. IV catheter discontinued intact. Site without signs and symptoms of complications - no redness or edema noted at insertion site, patient denies c/o pain -  only slight tenderness at site.  Dressing with slight pressure applied.  D/c Instructions-Education: Discharge instructions given to patient/family with verbalized understanding. D/c education completed with patient/family including follow up instructions, medication list, d/c activities limitations if indicated, with other d/c instructions as indicated by MD - patient able to verbalize understanding, all questions fully answered. Patient instructed to return to ED, call 911, or call MD for any changes in condition.  Patient escorted via WC, and D/C home via private auto.  Rebecca Farley, Tilford Pillar, RN 08/03/2020 11:06 AM

## 2020-08-03 NOTE — Progress Notes (Addendum)
      [address]       [place] [address]             [phone]      7 Days Post-Op Procedure(s) (LRB): AORTIC VALVE REPLACEMENT (AVR) USING 23 MM INSPIRIS RESILIA  AORTIC VALVE (N/A) TRANSESOPHAGEAL ECHOCARDIOGRAM (TEE) (N/A)   Subjective:  Patient sitting up in chair.  Continues to do well.  Her left arm continues to improve.  Ambulating independently.  Objective: Vital signs in last 24 hours: Temp:  [98.1 F (36.7 C)-98.5 F (36.9 C)] 98.4 F (36.9 C) (10/28 0453) Pulse Rate:  [72-93] 79 (10/28 0453) Cardiac Rhythm: Normal sinus rhythm (10/28 0126) Resp:  [18-30] 21 (10/28 0453) BP: (98-152)/(58-76) 138/73 (10/28 0453) SpO2:  [95 %-100 %] 96 % (10/28 0453) Weight:  [86.6 kg] 86.6 kg (10/28 0453)  Intake/Output from previous day: 10/27 0701 - 10/28 0700 In: 294.7 [P.O.:240; I.V.:54.7] Out: -   General appearance: alert, cooperative and no distress Heart: regular rate and rhythm Lungs: clear to auscultation bilaterally Abdomen: soft, non-tender; bowel sounds normal; no masses,  no organomegaly Extremities: edema none Wound: clean and dry, improvement in left AC phlebitis  Lab Results: No results for input(s): WBC, HGB, HCT, PLT in the last 72 hours. BMET: Recent Labs    08/01/20 0214  NA 137  K 4.0  CL 104  CO2 22  GLUCOSE 127*  BUN 15  CREATININE 0.80  CALCIUM 8.4*    PT/INR: No results for input(s): LABPROT, INR in the last 72 hours. ABG    Component Value Date/Time   PHART 7.347 (L) 07/27/2020 1713   HCO3 24.1 07/27/2020 1713   TCO2 25 07/27/2020 1713   ACIDBASEDEF 2.0 07/27/2020 1713   O2SAT 97.0 07/27/2020 1713   CBG (last 3)  No results for input(s): GLUCAP in the last 72 hours.  Assessment/Plan: S/P Procedure(s) (LRB): AORTIC VALVE REPLACEMENT (AVR) USING 23 MM INSPIRIS RESILIA  AORTIC VALVE (N/A) TRANSESOPHAGEAL ECHOCARDIOGRAM (TEE) (N/A)  1. CV- previous junctional tachycardia- currently NSR- continue Amiodarone,  Lopressor, Digoxin, will continue home Diovan 2. Pulm- no acute issues, continue IS 3. Renal- weight remains stable 4. Left AC Phlebitis- continue supportive care, Keflex 5. Dispo- patient stable, in NSR, will continue current medication regimen, continue Keflex for phlebitis, will plan to d/c home today   LOS: 7 days    Lowella Dandy, PA-C 08/03/2020   Chart reviewed, patient examined, agree with above. She looks good. Plan home today. Rhythm is intermittently sinus and junctional 80's-90's. Will continue present regimen and expect rhythm to return to sinus. She will followup in EP clinic 11/11 and may be able to get off amio and digoxin if she returns to sinus most of the time. Rhythm was junctional tachycardia 120's until digoxin added.

## 2020-08-03 NOTE — Progress Notes (Signed)
CARDIAC REHAB PHASE I    Reinforced d/c education. Reviewed importance of site care and monitoring incision daily. Encouraged walks and IS use. Pt denies questions or concerns. Waiting on pharmacy.   [phone] Reynold Bowen, RN BSN 08/03/2020 9:56 AM

## 2020-08-04 DIAGNOSIS — Z48812 Encounter for surgical aftercare following surgery on the circulatory system: Secondary | ICD-10-CM

## 2020-08-10 ENCOUNTER — Other Ambulatory Visit: Payer: Self-pay

## 2020-08-10 ENCOUNTER — Ambulatory Visit (INDEPENDENT_AMBULATORY_CARE_PROVIDER_SITE_OTHER): Payer: Self-pay

## 2020-08-10 DIAGNOSIS — Z4802 Encounter for removal of sutures: Secondary | ICD-10-CM

## 2020-08-10 NOTE — Progress Notes (Signed)
Removed 2 sutures from chest tube incision sites with no signs of infection and patient tolerated well. 

## 2020-08-15 ENCOUNTER — Telehealth (HOSPITAL_COMMUNITY): Payer: Self-pay

## 2020-08-15 NOTE — Telephone Encounter (Signed)
Pt insurance is active and benefits verified through Medicare A/B. Co-pay $0.00, DED $203.00/$203.00 met, out of pocket $0.00/$0.00 met, co-insurance 20%. No pre-authorization required. Passport, 08/15/20 @ 12:08PM, [id number]  2ndary insurance is active and benefits verified through [employer]. Co-pay $0.00, DED $0.00/$0.00 met, out of pocket $0.00/$0.00 met, co-insurance 0%. No pre-authorization required. Passport, 08/15/20 @ 12:10PM, HWY#[id number]  Will contact patient to see if she is interested in the Cardiac Rehab Program. If interested, patient will need to complete follow up appt. Once completed, patient will be contacted for scheduling upon review by the RN Navigator.

## 2020-08-15 NOTE — Progress Notes (Signed)
Cardiology Office Note   Date:  08/17/2020   ID:  Rebecca Farley, DOB 06/07/1956, MRN [id number]  PCP:  Kirby Funk, MD    No chief complaint on file.  S/p AVR  Wt Readings from Last 3 Encounters:  08/17/20 189 lb 12.8 oz (86.1 kg)  08/03/20 190 lb 14.4 oz (86.6 kg)  07/25/20 190 lb 6.4 oz (86.4 kg)       History of Present Illness: Rebecca Farley is a 64 y.o. female  With a h/o atrial flutter and AS.  Works at [employer].  She had a h/o atrial flutter in the past and had an ablation in [place] many years ago.  She had a heart murmur on her routine exam and was sent for an echo in 2020. She was not having any new symptoms. She does report some DOE for 15 years. She has had soe dizzy spells for 15 years as well. It can happen even when she was sitting down. She wore a monitor many years ago and this was ewhen the AFlutter was diagnosed. HR was 300 bpm per her report.  She had severe AS treated with SAVR in 2021 (Aortic valve replacement using a 23 mm Edwards INSPIRIS RESILIA valve.)  Denies : Chest pain.  Leg edema. Nitroglycerin use. Orthopnea. Palpitations. Paroxysmal nocturnal dyspnea.  Syncope.   She is doing well.  Some fatigue since surgery. Getting stronger.  Past Medical History:  Diagnosis Date   Atrial flutter (HCC) 01/2008   RADIOFREQUENCY ABLATION [place]   Barrett esophagus    Bee sting allergy    Diverticulitis    DJD (degenerative joint disease) of knee    KNEES, BACK (LUMBAR FACET DISEASE) AND HIPS, DALLDORF.   GERD (gastroesophageal reflux disease)    Hiatal hernia    History of syncope    RECURRENT   Hyperlipidemia    BORDERLINE   Hypertension    Migraines    Obesity    Osteopenia    S/P RF ablation operation for arrhythmia    Severe aortic stenosis    Shoulder tendinitis, left 2008    Past Surgical History:  Procedure Laterality Date   ABDOMINAL HYSTERECTOMY     AORTIC VALVE REPLACEMENT  N/A 07/27/2020   Procedure: AORTIC VALVE REPLACEMENT (AVR) USING 23 MM INSPIRIS RESILIA  AORTIC VALVE;  Surgeon: Alleen Borne, MD;  Location: [facility] OR;  Service: Open Heart Surgery;  Laterality: N/A;   BREAST BIOPSY Left    10 yrs +   CESAREAN SECTION     1979, 1984   CHOLECYSTECTOMY     COLONOSCOPY  11/03/2012   Procedure: COLONOSCOPY;  Surgeon: Charolett Bumpers, MD;  Location: [facility] ENDOSCOPY;  Service: Endoscopy;  Laterality: N/A;   ESOPHAGOGASTRODUODENOSCOPY  11/03/2012   Procedure: ESOPHAGOGASTRODUODENOSCOPY (EGD);  Surgeon: Charolett Bumpers, MD;  Location: [facility];  Service: Endoscopy;  Laterality: N/A;   implanted loop device     RIGHT HEART CATH AND CORONARY ANGIOGRAPHY N/A 07/07/2020   Procedure: RIGHT HEART CATH AND CORONARY ANGIOGRAPHY;  Surgeon: Tonny Bollman, MD;  Location: [facility] INVASIVE CV LAB;  Service: Cardiovascular;  Laterality: N/A;   TEE WITHOUT CARDIOVERSION N/A 07/27/2020   Procedure: TRANSESOPHAGEAL ECHOCARDIOGRAM (TEE);  Surgeon: Alleen Borne, MD;  Location: [facility] OR;  Service: Open Heart Surgery;  Laterality: N/A;   TOTAL HIP ARTHROPLASTY Right 11/23/2019   Procedure: RIGHT TOTAL HIP ARTHROPLASTY ANTERIOR APPROACH;  Surgeon: Marcene Corning, MD;  Location: [facility] ORS;  Service:  Orthopedics;  Laterality: Right;     Current Outpatient Medications  Medication Sig Dispense Refill   acetaminophen (TYLENOL) 500 MG tablet Take 500-1,000 mg by mouth every 6 (six) hours as needed (for pain.).     amiodarone (PACERONE) 200 MG tablet Take 1 tablet (200 mg total) by mouth 2 (two) times daily. X 7 days, then decrease to 200 mg daily 60 tablet 1   Ascorbic Acid (VITAMIN C WITH ROSE HIPS) 500 MG tablet Take 500 mg by mouth daily.     aspirin EC 325 MG EC tablet Take 1 tablet (325 mg total) by mouth daily. 30 tablet 0   b complex vitamins tablet Take 1 tablet by mouth daily.     B Complex-C (B-COMPLEX WITH VITAMIN C) tablet Take 1 tablet by mouth daily.      Calcium  Carb-Cholecalciferol (CALCIUM+D3 PO) Take 1 tablet by mouth daily.     cephALEXin (KEFLEX) 500 MG capsule Take 1 capsule (500 mg total) by mouth every 8 (eight) hours. 21 capsule 0   digoxin (LANOXIN) 0.125 MG tablet Take 1 tablet (0.125 mg total) by mouth daily. 30 tablet 3   EPINEPHrine (EPIPEN 2-PAK) 0.3 mg/0.3 mL IJ SOAJ injection Inject 0.3 mg into the muscle as needed for anaphylaxis.     folic acid (FOLVITE) 1 MG tablet Take 1 mg by mouth daily.     Galcanezumab-gnlm (EMGALITY) 120 MG/ML SOAJ Inject 120 mg into the skin every 30 (thirty) days. 1.12 mL 11   Ginkgo Biloba 120 MG TABS Take 120 mg by mouth daily.     Glucosamine-Chondroitin (COSAMIN DS PO) Take 1-2 tablets by mouth See admin instructions. Take 2 tablets by mouth in the morning & take 1 tablet by mouth at night.     metoprolol tartrate (LOPRESSOR) 50 MG tablet Take 1 tablet (50 mg total) by mouth 2 (two) times daily. 60 tablet 3   Multiple Vitamin (MULTIVITAMIN WITH MINERALS) TABS tablet Take 1 tablet by mouth daily.     Multiple Vitamins-Minerals (EMERGEN-C IMMUNE PLUS) PACK Take 1 packet by mouth daily.     Omega-3 1000 MG CAPS Take 2,000 mg by mouth daily.     omeprazole (PRILOSEC) 20 MG capsule Take 20 mg by mouth daily before breakfast.      Polyethyl Glycol-Propyl Glycol (LUBRICANT EYE DROPS) 0.4-0.3 % SOLN Place 1 drop into both eyes 3 (three) times daily as needed (dry/irritated eyes.).     Rimegepant Sulfate (NURTEC) 75 MG TBDP Take 75 mg by mouth daily as needed. For migraines. Take as close to onset of migraine as possible. One daily maximum. (Patient taking differently: Take 75 mg by mouth daily as needed (migraines). For migraines. Take as close to onset of migraine as possible. One daily maximum.) 8 tablet 6   traMADol (ULTRAM) 50 MG tablet Take 1 tablet (50 mg total) by mouth every 6 (six) hours as needed for moderate pain. 30 tablet 0   Turmeric 500 MG TABS Take 1,000 mg by mouth daily.      valsartan-hydrochlorothiazide (DIOVAN-HCT) 160-12.5 MG tablet Take 1 tablet by mouth daily.      No current facility-administered medications for this visit.    Allergies:   Mobic [meloxicam], Penicillins, Codeine, and Nsaids    Social History:  The patient  reports that she has never smoked. She has never used smokeless tobacco. She reports current alcohol use. She reports that she does not use drugs.   Family History:  The patient's family history includes  Aneurysm in her mother; Colon cancer (age of onset: 23) in her sister; Colon cancer (age of onset: 47) in her brother; Emphysema in her father; Heart failure in her mother; Hypertension in her mother; Idiopathic pulmonary fibrosis in her brother; Kidney failure in her mother; Liver cancer in her maternal uncle; Lung cancer in her father, maternal uncle, and paternal uncle; Melanoma in her sister; Melanoma (age of onset: 3) in her sister; Other in her sister, sister, sister, and sister; Ovarian cancer in her paternal grandmother; Prostate cancer in her father.    ROS:  Please see the history of present illness.   Otherwise, review of systems are positive for fatigue post op.   All other systems are reviewed and negative.    PHYSICAL EXAM: VS:  BP (!) 104/56    Pulse 65    Ht 5\' 5"  (1.651 m)    Wt 189 lb 12.8 oz (86.1 kg)    SpO2 94%    BMI 31.58 kg/m  , BMI Body mass index is 31.58 kg/m. GEN: Well nourished, well developed, in no acute distress  HEENT: normal  Neck: no JVD, carotid bruits, or masses Cardiac: RRR; no murmurs, rubs, or gallops,no edema  Respiratory:  clear to auscultation bilaterally, normal work of breathing GI: soft, nontender, nondistended, + BS MS: no deformity or atrophy  Skin: warm and dry, no rash Neuro:  Strength and sensation are intact Psych: euthymic mood, full affect   EKG:   The ekg ordered today demonstrates junctional rhythm   Recent Labs: 06/15/2020: TSH 2.660 07/25/2020: ALT 16 07/28/2020:  Magnesium 2.0 07/31/2020: Hemoglobin 9.5; Platelets 175 08/01/2020: BUN 15; Creatinine, Ser 0.80; Potassium 4.0; Sodium 137   Lipid Panel No results found for: CHOL, TRIG, HDL, CHOLHDL, VLDL, LDLCALC, LDLDIRECT   Other studies Reviewed: Additional studies/ records that were reviewed today with results demonstrating: hospital records reviewed.   ASSESSMENT AND PLAN:  1. Aortic valve stenosis: s/p AVR. Needs SBE prophylaxis.  Some fatigue but overall doing well postop.  SHe will do cardiac rehab.  2. AFlutter: s/p ablation.  Now junctional rhythm post op vs. NSR with very long PR interval.  Stop digoxin.  Repeat ECG with Dr. 08/03/2020 in a week. Can likely stop Amiodarone at that time if in NSR.   3. HTN: Decrease valsartan/HCTZ to half tab for now.  As BP increases, can go back to full tab. 4. Whole food plant based diet. We spoke about dental cleanings and SBE prophylaxis at length.    Current medicines are reviewed at length with the patient today.  The patient concerns regarding her medicines were addressed.  The following changes have been made:  As above  Labs/ tests ordered today include: No orders of the defined types were placed in this encounter.   Recommend 150 minutes/week of aerobic exercise Low fat, low carb, high fiber diet recommended  Disposition:   FU in 3 months   Signed, Laneta Simmers, MD  08/17/2020 9:40 AM    [facility] [address] [place], [place], [place]  [place] Phone: [phone]; Fax: [phone]

## 2020-08-15 NOTE — Telephone Encounter (Signed)
Called patient to see if she is interested in the Cardiac Rehab Program. Patient expressed interest. Explained scheduling process and went over insurance, patient verbalized understanding. Will contact patient for scheduling once f/u has been completed. 

## 2020-08-16 NOTE — Progress Notes (Signed)
PCP:  Kirby FunkGriffin, John, MD Primary Cardiologist: Lance MussJayadeep Varanasi, MD Electrophysiologist: Hillis RangeJames Allred, MD   Rebecca Farley is a 64 y.o. female seen today for Hillis RangeJames Allred, MD for post hospital follow up.    Rebecca Farley was admitted 07/27/2020 for AVR, underwent her surgery using 23 mm [employer] Resilia Inspiris. Extubated same day, ambulating POD 1. Post op in SR, lopressor resumed 10/23. 10/24 CT rounding note mentioned AFib and started on amiodarone gtt and BN increased, 10/25 suspecedt to have accelerated junctional rhythm.  She was hypertensive and BB titrated and transitioned to oral amio Dr. Laneta SimmersBartle mentions rates 120;s and tried to overdrive A pace without success. EP consulted who recommended continuing current therapy with outpatient follow up to see how her conduction would heal post AVR.   Since discharge from hospital the patient reports doing well. She remains somewhat fatigued from surgery. She is walking 10 minutes 3-4 times a day, gently. Her HR gets up into the 80s during these walks. If she tries to push for > 15 minutes she gets tired and mildly dizzy. she denies chest pain, palpitations, dyspnea, PND, orthopnea, nausea, vomiting, syncope, edema, weight gain, or early satiety.  Past Medical History:  Diagnosis Date  . Atrial flutter (HCC) 01/2008   RADIOFREQUENCY ABLATION [place]  . Barrett esophagus   . Bee sting allergy   . Diverticulitis   . DJD (degenerative joint disease) of knee    KNEES, BACK (LUMBAR FACET DISEASE) AND HIPS, DALLDORF.  Marland Kitchen. GERD (gastroesophageal reflux disease)   . Hiatal hernia   . History of syncope    RECURRENT  . Hyperlipidemia    BORDERLINE  . Hypertension   . Migraines   . Obesity   . Osteopenia   . S/P RF ablation operation for arrhythmia   . Severe aortic stenosis   . Shoulder tendinitis, left 2008   Past Surgical History:  Procedure Laterality Date  . ABDOMINAL HYSTERECTOMY    . AORTIC VALVE REPLACEMENT N/A  07/27/2020   Procedure: AORTIC VALVE REPLACEMENT (AVR) USING 23 MM INSPIRIS RESILIA  AORTIC VALVE;  Surgeon: Alleen BorneBartle, Bryan K, MD;  Location: [facility] OR;  Service: Open Heart Surgery;  Laterality: N/A;  . BREAST BIOPSY Left    10 yrs +  . CESAREAN SECTION     1979, 1984  . CHOLECYSTECTOMY    . COLONOSCOPY  11/03/2012   Procedure: COLONOSCOPY;  Surgeon: Charolett BumpersMartin K Johnson, MD;  Location: [facility];  Service: Endoscopy;  Laterality: N/A;  . ESOPHAGOGASTRODUODENOSCOPY  11/03/2012   Procedure: ESOPHAGOGASTRODUODENOSCOPY (EGD);  Surgeon: Charolett BumpersMartin K Johnson, MD;  [facility];  Service: Endoscopy;  Laterality: N/A;  . implanted loop device    . RIGHT HEART CATH AND CORONARY ANGIOGRAPHY N/A 07/07/2020   Procedure: RIGHT HEART CATH AND CORONARY ANGIOGRAPHY;  Surgeon: Tonny Bollmanooper, Rilee Knoll, MD;  [facility];  Service: Cardiovascular;  Laterality: N/A;  . TEE WITHOUT CARDIOVERSION N/A 07/27/2020   Procedure: TRANSESOPHAGEAL ECHOCARDIOGRAM (TEE);  Surgeon: Alleen BorneBartle, Bryan K, MD;  [facility];  Service: Open Heart Surgery;  Laterality: N/A;  . TOTAL HIP ARTHROPLASTY Right 11/23/2019   Procedure: RIGHT TOTAL HIP ARTHROPLASTY ANTERIOR APPROACH;  Surgeon: Marcene Corningalldorf, Peter, MD;  Location: [facility] ORS;  Service: Orthopedics;  Laterality: Right;    Current Outpatient Medications  Medication Sig Dispense Refill  . acetaminophen (TYLENOL) 500 MG tablet Take 500-1,000 mg by mouth every 6 (six) hours as needed (for pain.).    Marland Kitchen. Ascorbic Acid (VITAMIN  C WITH ROSE HIPS) 500 MG tablet Take 500 mg by mouth daily.    Marland Kitchen aspirin EC 325 MG EC tablet Take 1 tablet (325 mg total) by mouth daily. 30 tablet 0  . b complex vitamins tablet Take 1 tablet by mouth daily.    . B Complex-C (B-COMPLEX WITH VITAMIN C) tablet Take 1 tablet by mouth daily.     . Calcium Carb-Cholecalciferol (CALCIUM+D3 PO) Take 1 tablet by mouth daily.    . cephALEXin (KEFLEX) 500 MG capsule Take 1 capsule (500 mg total) by mouth every 8  (eight) hours. 21 capsule 0  . EPINEPHrine (EPIPEN 2-PAK) 0.3 mg/0.3 mL IJ SOAJ injection Inject 0.3 mg into the muscle as needed for anaphylaxis.    . folic acid (FOLVITE) 1 MG tablet Take 1 mg by mouth daily.    . Galcanezumab-gnlm (EMGALITY) 120 MG/ML SOAJ Inject 120 mg into the skin every 30 (thirty) days. 1.12 mL 11  . Ginkgo Biloba 120 MG TABS Take 120 mg by mouth daily.    . Glucosamine-Chondroitin (COSAMIN DS PO) Take 1-2 tablets by mouth See admin instructions. Take 2 tablets by mouth in the morning & take 1 tablet by mouth at night.    . metoprolol tartrate (LOPRESSOR) 50 MG tablet Take 1 tablet (50 mg total) by mouth 2 (two) times daily. 60 tablet 3  . Multiple Vitamin (MULTIVITAMIN WITH MINERALS) TABS tablet Take 1 tablet by mouth daily.    . Multiple Vitamins-Minerals (EMERGEN-C IMMUNE PLUS) PACK Take 1 packet by mouth daily.    . Omega-3 1000 MG CAPS Take 2,000 mg by mouth daily.    Marland Kitchen omeprazole (PRILOSEC) 20 MG capsule Take 20 mg by mouth daily before breakfast.     . Polyethyl Glycol-Propyl Glycol (LUBRICANT EYE DROPS) 0.4-0.3 % SOLN Place 1 drop into both eyes 3 (three) times daily as needed (dry/irritated eyes.).    Marland Kitchen Rimegepant Sulfate (NURTEC) 75 MG TBDP Take 75 mg by mouth daily as needed. For migraines. Take as close to onset of migraine as possible. One daily maximum. (Patient taking differently: Take 75 mg by mouth daily as needed (migraines). For migraines. Take as close to onset of migraine as possible. One daily maximum.) 8 tablet 6  . traMADol (ULTRAM) 50 MG tablet Take 1 tablet (50 mg total) by mouth every 6 (six) hours as needed for moderate pain. 30 tablet 0  . Turmeric 500 MG TABS Take 1,000 mg by mouth daily.    . valsartan-hydrochlorothiazide (DIOVAN-HCT) 160-12.5 MG tablet Take 1 tablet by mouth daily.      No current facility-administered medications for this visit.    Allergies  Allergen Reactions  . Mobic [Meloxicam] Shortness Of Breath  . Penicillins  Anaphylaxis, Hives and Other (See Comments)    Has patient had a PCN reaction causing immediate rash, facial/tongue/throat swelling, SOB or lightheadedness with hypotension: No Has patient had a PCN reaction causing severe rash involving mucus membranes or skin necrosis:  No Has patient had a PCN reaction that required hospitalization No Has patient had a PCN reaction occurring within the last 10 years: No If all of the above answers are "NO", then may proceed with Cephalosporin use.  . Codeine     STOMACH SICKNESS  . Nsaids Itching and Nausea And Vomiting    Social History   Socioeconomic History  . Marital status: Widowed    Spouse name: Not on file  . Number of children: Not on file  . Years of education:  Not on file  . Highest education level: Not on file  Occupational History  . Not on file  Tobacco Use  . Smoking status: Never Smoker  . Smokeless tobacco: Never Used  Vaping Use  . Vaping Use: Never used  Substance and Sexual Activity  . Alcohol use: Yes    Comment: OCCASIONALY  . Drug use: No  . Sexual activity: Not on file  Other Topics Concern  . Not on file  Social History Narrative  . Not on file   Social Determinants of Health   Financial Resource Strain:   . Difficulty of Paying Living Expenses: Not on file  Food Insecurity:   . Worried About Programme researcher, broadcasting/film/video in the Last Year: Not on file  . Ran Out of Food in the Last Year: Not on file  Transportation Needs:   . Lack of Transportation (Medical): Not on file  . Lack of Transportation (Non-Medical): Not on file  Physical Activity:   . Days of Exercise per Week: Not on file  . Minutes of Exercise per Session: Not on file  Stress:   . Feeling of Stress : Not on file  Social Connections:   . Frequency of Communication with Friends and Family: Not on file  . Frequency of Social Gatherings with Friends and Family: Not on file  . Attends Religious Services: Not on file  . Active Member of Clubs or  Organizations: Not on file  . Attends Banker Meetings: Not on file  . Marital Status: Not on file  Intimate Partner Violence:   . Fear of Current or Ex-Partner: Not on file  . Emotionally Abused: Not on file  . Physically Abused: Not on file  . Sexually Abused: Not on file    Review of Systems: General: No chills, fever, night sweats or weight changes  Cardiovascular:  No chest pain, dyspnea on exertion, edema, orthopnea, palpitations, paroxysmal nocturnal dyspnea Dermatological: No rash, lesions or masses Respiratory: No cough, dyspnea Urologic: No hematuria, dysuria Abdominal: No nausea, vomiting, diarrhea, bright red blood per rectum, melena, or hematemesis Neurologic: No visual changes, weakness, changes in mental status All other systems reviewed and are otherwise negative except as noted above.  Physical Exam: Vitals:   08/17/20 1003  BP: (!) 104/56  Pulse: 65  SpO2: 94%  Weight: 189 lb (85.7 kg)  Height: 5\' 5"  (1.651 m)    GEN- The patient is well appearing, alert and oriented x 3 today.   HEENT: normocephalic, atraumatic; sclera clear, conjunctiva pink; hearing intact; oropharynx clear; neck supple, no JVP Lymph- no cervical lymphadenopathy Lungs- Clear to ausculation bilaterally, normal work of breathing.  No wheezes, rales, rhonchi Heart- Regular rate and rhythm, no murmurs, rubs or gallops, PMI not laterally displaced GI- soft, non-tender, non-distended, bowel sounds present, no hepatosplenomegaly Extremities- no clubbing, cyanosis, or edema; DP/PT/radial pulses 2+ bilaterally MS- no significant deformity or atrophy Skin- warm and dry, no rash or lesion Psych- euthymic mood, full affect Neuro- strength and sensation are intact  EKG is ordered. Personal review of EKG from today shows marked 1st degree block with NSR at 62 bpm, QRS 124 ms  Additional studies reviewed include: Previous EP notes, EKGs in hospital.  Assessment and Plan:  1.  Accelerated junctional rhythm Post AVR, described as having "accelerated junctional rhythm with likely a firing automatic focus near the AV node" Remains on amiodarone 200 mg daily. We will STOP this today Digoxin stopped this am by Dr. . Continue lopressor  50 mg BID for now. We will see her back in 2-3 weeks and consider down titration of this.  Echo 07/2020 LVEF 65-70%. Plan for repeat in December post AVR.  2. H/o AFL S/p ablation when living in [place] in 2009  Reviewed above with Dr. Johney Frame in office today. Agree with stopping digoxin. Stop amiodarone. Continue lopressor for now with close follow up in 2-3 weeks for consideration of down titration.   Rebecca Freer, PA-C  08/17/20 10:43 AM

## 2020-08-17 ENCOUNTER — Encounter: Payer: Self-pay | Admitting: Student

## 2020-08-17 ENCOUNTER — Encounter: Payer: Self-pay | Admitting: Interventional Cardiology

## 2020-08-17 ENCOUNTER — Other Ambulatory Visit: Payer: Self-pay

## 2020-08-17 ENCOUNTER — Ambulatory Visit (INDEPENDENT_AMBULATORY_CARE_PROVIDER_SITE_OTHER): Payer: Medicare Other | Admitting: Interventional Cardiology

## 2020-08-17 ENCOUNTER — Ambulatory Visit (INDEPENDENT_AMBULATORY_CARE_PROVIDER_SITE_OTHER): Payer: Medicare Other | Admitting: Student

## 2020-08-17 VITALS — BP 104/56 | HR 65 | Ht 65.0 in | Wt 189.0 lb

## 2020-08-17 VITALS — BP 104/56 | HR 65 | Ht 65.0 in | Wt 189.8 lb

## 2020-08-17 DIAGNOSIS — I10 Essential (primary) hypertension: Secondary | ICD-10-CM

## 2020-08-17 DIAGNOSIS — I440 Atrioventricular block, first degree: Secondary | ICD-10-CM

## 2020-08-17 DIAGNOSIS — I498 Other specified cardiac arrhythmias: Secondary | ICD-10-CM | POA: Diagnosis not present

## 2020-08-17 DIAGNOSIS — I4892 Unspecified atrial flutter: Secondary | ICD-10-CM | POA: Diagnosis not present

## 2020-08-17 DIAGNOSIS — I350 Nonrheumatic aortic (valve) stenosis: Secondary | ICD-10-CM | POA: Diagnosis not present

## 2020-08-17 NOTE — Patient Instructions (Addendum)
Medication Instructions:  Your physician has recommended you make the following change in your medication:   1. STOP: digoxin  2. Decrease the valsartan-hctz to taking a 1/2 tablet once a day until you blood pressure normalizes and then go back to taking a full tablet once a day  *If you need a refill on your cardiac medications before your next appointment, please call your pharmacy*   Lab Work: None  If you have labs (blood work) drawn today and your tests are completely normal, you will receive your results only by: Marland Kitchen MyChart Message (if you have MyChart) OR . A paper copy in the mail If you have any lab test that is abnormal or we need to change your treatment, we will call you to review the results.   Testing/Procedures: None   Follow-Up: Follow up with Dr. Eldridge Dace on 11/22/19 at 9:20 AM   Other Instructions None

## 2020-08-17 NOTE — Patient Instructions (Addendum)
Medication Instructions:  Your physician has recommended you make the following change in your medication:  -- STOP Amiodarone (Pacerone) -- *If you need a refill on your cardiac medications before your next appointment, please call your pharmacy*  Follow-Up: At [facility], you and your health needs are our priority.  As part of our continuing mission to provide you with exceptional heart care, we have created designated Provider Care Teams.  These Care Teams include your primary Cardiologist (physician) and Advanced Practice Providers (APPs -  Physician Assistants and Nurse Practitioners) who all work together to provide you with the care you need, when you need it.  We recommend signing up for the patient portal called "MyChart".  Sign up information is provided on this After Visit Summary.  MyChart is used to connect with patients for Virtual Visits (Telemedicine).  Patients are able to view lab/test results, encounter notes, upcoming appointments, etc.  Non-urgent messages can be sent to your provider as well.   To learn more about what you can do with MyChart, go to [email or web address].    Your next appointment:   Your physician recommends that you schedule a follow-up appointment in: 3 WEEKS with Otilio Saber, PA-C. -- 09/05/20 at 9:40 am

## 2020-08-21 ENCOUNTER — Telehealth: Payer: Self-pay | Admitting: *Deleted

## 2020-08-21 NOTE — Telephone Encounter (Signed)
Completed Nurtec 75 mg PA on Cover My Meds. Key: [id number]. Awaiting determination from [facility].

## 2020-08-22 ENCOUNTER — Other Ambulatory Visit: Payer: Self-pay | Admitting: Surgery

## 2020-08-22 DIAGNOSIS — Z952 Presence of prosthetic heart valve: Secondary | ICD-10-CM

## 2020-08-22 NOTE — Telephone Encounter (Signed)
Received a questionare sheet from [employer] to be completed. They are asking if the pt has tried and failed rizatriptan, sumatriptan, naratriptan.  Advised the patient has not tried these and given her cardiac history it is not recommended for the patient to use the above named medication. I have forwarded the answers to [facility].

## 2020-08-23 ENCOUNTER — Ambulatory Visit
Admission: RE | Admit: 2020-08-23 | Discharge: 2020-08-23 | Disposition: A | Discharge status: Home / Self Care | Payer: Medicare Other | Source: Ambulatory Visit | Attending: Surgery | Admitting: Surgery

## 2020-08-23 ENCOUNTER — Ambulatory Visit (INDEPENDENT_AMBULATORY_CARE_PROVIDER_SITE_OTHER): Payer: Self-pay | Admitting: Surgery

## 2020-08-23 ENCOUNTER — Telehealth: Payer: Self-pay | Admitting: Neurology

## 2020-08-23 ENCOUNTER — Encounter: Payer: Self-pay | Admitting: Surgery

## 2020-08-23 ENCOUNTER — Other Ambulatory Visit: Payer: Self-pay

## 2020-08-23 VITALS — BP 115/63 | HR 69 | Temp 97.7°F | Resp 20 | Ht 65.0 in | Wt 191.2 lb

## 2020-08-23 DIAGNOSIS — Z952 Presence of prosthetic heart valve: Secondary | ICD-10-CM

## 2020-08-23 NOTE — Telephone Encounter (Signed)
I called pt. I advised her that the nurtec PA was submitted on 08/21/20 but have not received a determination yet.  I started the emgality PA. Pt reports a reduction in her migraines since starting emgality. She reports that she has tried and failed topamax, imipramine, metoprolol, propranolol, and baclofen for migraines. PA submitted via CMM to [facility]. Key: [id number]. Should have a determination in 1-3 business days.    Pt would like a call when nurtec and emgality PAs have determinations.

## 2020-08-23 NOTE — Telephone Encounter (Signed)
Pt called, was told by pharmacy need PA Rimegepant Sulfate (NURTEC) 75 MG TBDP and Galcanezumab-gnlm (EMGALITY) 120 MG/ML SOAJ. Will be out of Emgality in a 1 1/2 week. Would like a call from the nurse.

## 2020-08-23 NOTE — Telephone Encounter (Signed)
PA for emgality was approved by [facility]. "PA Case: [id number], Status: Approved, Coverage Starts on: 10/08/2019 12:00:00 AM, Coverage Ends on: 10/06/2021 12:00:00 AM. Questions? Contact [phone]."

## 2020-08-23 NOTE — Progress Notes (Signed)
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 The patient's early postoperative recovery while in the hospital was notable for postoperative atrial fibrillation that required amiodarone, Lopressor, and digoxin to control.  She developed some accelerated junctional rhythm and was seen by EP who did not feel there was any signs of complete heart block and that her rhythm would return naturally.  At the time of discharge her rhythm was junctional in the 90s.  She also developed left antecubital vein phlebitis after intravenous amiodarone and this was treated with Keflex. Since hospital discharge she was seen back in the electrophysiology clinic on 08/17/2020.  Electrocardiogram at that time showed sinus rhythm with marked first-degree AV block.  Her amiodarone and digoxin were stopped and she was continued on Lopressor 50 mg twice daily.  She said that she feels much better since being off digoxin and amiodarone.  She is walking daily without chest pain or shortness of breath.  Her only complaint this time is of not sleeping that well.  Her daughter is with her today and feels like she continues to improve and said that she is walking well.   Current Outpatient Medications  Medication Sig Dispense Refill  . acetaminophen (TYLENOL) 500 MG tablet Take 500-1,000 mg by mouth every 6 (six) hours as needed (for pain.).    Marland Kitchen Ascorbic Acid (VITAMIN C WITH ROSE HIPS) 500 MG tablet Take 500 mg by mouth daily.    Marland Kitchen aspirin EC 325 MG EC tablet Take 1 tablet (325 mg total) by mouth daily. 30 tablet 0  . b complex vitamins tablet Take 1 tablet by mouth daily.    . B Complex-C (B-COMPLEX WITH VITAMIN C) tablet Take 1 tablet by mouth daily.     . Calcium Carb-Cholecalciferol (CALCIUM+D3 PO) Take 1 tablet by mouth daily.    Marland Kitchen EPINEPHrine (EPIPEN 2-PAK) 0.3 mg/0.3 mL IJ SOAJ injection Inject 0.3 mg  into the muscle as needed for anaphylaxis.    . folic acid (FOLVITE) 1 MG tablet Take 1 mg by mouth daily.    . Galcanezumab-gnlm (EMGALITY) 120 MG/ML SOAJ Inject 120 mg into the skin every 30 (thirty) days. 1.12 mL 11  . Ginkgo Biloba 120 MG TABS Take 120 mg by mouth daily.    . Glucosamine-Chondroitin (COSAMIN DS PO) Take 1-2 tablets by mouth See admin instructions. Take 2 tablets by mouth in the morning & take 1 tablet by mouth at night.    . metoprolol tartrate (LOPRESSOR) 50 MG tablet Take 1 tablet (50 mg total) by mouth 2 (two) times daily. 60 tablet 3  . Multiple Vitamin (MULTIVITAMIN WITH MINERALS) TABS tablet Take 1 tablet by mouth daily.    . Multiple Vitamins-Minerals (EMERGEN-C IMMUNE PLUS) PACK Take 1 packet by mouth daily.    . Omega-3 1000 MG CAPS Take 2,000 mg by mouth daily.    Marland Kitchen omeprazole (PRILOSEC) 20 MG capsule Take 20 mg by mouth daily before breakfast.     . Polyethyl Glycol-Propyl Glycol (LUBRICANT EYE DROPS) 0.4-0.3 % SOLN Place 1 drop into both eyes 3 (three) times daily as needed (dry/irritated eyes.).    Marland Kitchen Rimegepant Sulfate (NURTEC) 75 MG TBDP Take 75 mg by mouth daily as needed. For migraines. Take as close to onset of migraine as possible. One daily maximum. (Patient taking differently: Take 75 mg by mouth daily as needed (migraines). For migraines. Take as close to onset of  migraine as possible. One daily maximum.) 8 tablet 6  . Turmeric 500 MG TABS Take 1,000 mg by mouth daily.    . valsartan-hydrochlorothiazide (DIOVAN-HCT) 160-12.5 MG tablet Take 1 tablet by mouth daily.      No current facility-administered medications for this visit.    Physical Exam: BP 115/63 (BP Location: Right Arm, Patient Position: Sitting, Cuff Size: Large)   Pulse 69   Temp 97.7 F (36.5 C) (Skin)   Resp 20   Ht 5\' 5"  (1.651 m)   Wt 191 lb 3.2 oz (86.7 kg)   SpO2 95% Comment: RA  BMI 31.82 kg/m  She looks well. Cardiac exam shows a regular rate and rhythm with normal heart  sounds.  There is no murmur. Lungs are clear. Chest incision is well-healed and the sternum is stable. There is no peripheral edema.  Diagnostic Tests:  CLINICAL DATA:  Status post aortic valve replacement. Shortness of breath.  EXAM: CHEST - 2 VIEW  COMPARISON:  July 31, 2020  FINDINGS: There is a small left pleural effusion. There is slight left base atelectasis. Lungs elsewhere are clear. Heart size and pulmonary vascularity are normal. The patient is status post aortic valve replacement. There is a loop recorder on the left. No adenopathy. No pneumothorax. No bone lesions.  IMPRESSION: Small left pleural effusion with left base atelectasis. No edema or airspace opacity. Heart size within normal limits. Status post aortic valve replacement. Apparent loop recorder on the left.   Electronically Signed   By: August 02, 2020 III M.D.   On: 08/23/2020 13:58   Impression:  Overall I think she is making a good recovery following aortic valve replacement surgery.  Her rhythm is regular at about 70 today.  I told her that she could return to driving a car when she feels comfortable with that.  I asked her not to lift anything heavier than 10 pounds for 3 months postoperatively.  I encouraged her to continue increasing the distance that she is walking.  Plan:  She is going to follow-up in a couple weeks and electrophysiology clinic.  She will continue to follow-up with Dr. 08/25/2020 for her cardiology care.  She has a follow-up baseline echocardiogram scheduled for 09/26/2020.  She will return to see me if she has any problems with her incisions.   09/28/2020, MD [facility] [phone]

## 2020-08-23 NOTE — Telephone Encounter (Signed)
I called pt. I advised her that her emgality PA was approved by [facility]. Nurtec is still pending. Pt verbalized understanding.

## 2020-08-30 ENCOUNTER — Encounter: Payer: Self-pay | Admitting: *Deleted

## 2020-08-30 MED FILL — METOPROLOL TARTRATE 50 MG T: 50 | 30 days supply | Qty: 60 | Fill #0

## 2020-09-05 ENCOUNTER — Encounter: Payer: Self-pay | Admitting: Student

## 2020-09-05 ENCOUNTER — Other Ambulatory Visit: Payer: Self-pay

## 2020-09-05 ENCOUNTER — Ambulatory Visit (INDEPENDENT_AMBULATORY_CARE_PROVIDER_SITE_OTHER): Payer: Medicare Other | Admitting: Student

## 2020-09-05 VITALS — BP 102/70 | HR 62 | Wt 189.0 lb

## 2020-09-05 DIAGNOSIS — I4892 Unspecified atrial flutter: Secondary | ICD-10-CM | POA: Diagnosis not present

## 2020-09-05 DIAGNOSIS — I10 Essential (primary) hypertension: Secondary | ICD-10-CM

## 2020-09-05 DIAGNOSIS — I498 Other specified cardiac arrhythmias: Secondary | ICD-10-CM | POA: Diagnosis not present

## 2020-09-05 DIAGNOSIS — I440 Atrioventricular block, first degree: Secondary | ICD-10-CM

## 2020-09-05 MED ORDER — METOPROLOL TARTRATE 25 MG PO TABS: 25.0000 mg | ORAL_TABLET | Freq: Two times a day (BID) | ORAL | 3 refills | Status: DC

## 2020-09-05 NOTE — Progress Notes (Signed)
PCP:  Kirby Funk, MD Primary Cardiologist: Lance Muss, MD Electrophysiologist: Hillis Range, MD   Rebecca Farley is a 64 y.o. female seen today for Hillis Range, MD for routine electrophysiology followup.  Since last being seen in our clinic the patient reports doing very well. She has had intermittent mild lightheadedness with rapid standing, but not marked or limiting. she denies chest pain, palpitations, dyspnea, PND, orthopnea, nausea, vomiting, syncope, edema, weight gain, or early satiety.   Past Medical History:  Diagnosis Date  . Atrial flutter (HCC) 01/2008   RADIOFREQUENCY ABLATION [place]  . Barrett esophagus   . Bee sting allergy   . Diverticulitis   . DJD (degenerative joint disease) of knee    KNEES, BACK (LUMBAR FACET DISEASE) AND HIPS, DALLDORF.  Marland Kitchen GERD (gastroesophageal reflux disease)   . Hiatal hernia   . History of syncope    RECURRENT  . Hyperlipidemia    BORDERLINE  . Hypertension   . Migraines   . Obesity   . Osteopenia   . S/P RF ablation operation for arrhythmia   . Severe aortic stenosis   . Shoulder tendinitis, left 2008   Past Surgical History:  Procedure Laterality Date  . ABDOMINAL HYSTERECTOMY    . AORTIC VALVE REPLACEMENT N/A 07/27/2020   Procedure: AORTIC VALVE REPLACEMENT (AVR) USING 23 MM INSPIRIS RESILIA  AORTIC VALVE;  Surgeon: Alleen Borne, MD;  Location: [facility] OR;  Service: Open Heart Surgery;  Laterality: N/A;  . BREAST BIOPSY Left    10 yrs +  . CESAREAN SECTION     1979, 1984  . CHOLECYSTECTOMY    . COLONOSCOPY  11/03/2012   Procedure: COLONOSCOPY;  Surgeon: Charolett Bumpers, MD;  Location: [facility] ENDOSCOPY;  Service: Endoscopy;  Laterality: N/A;  . ESOPHAGOGASTRODUODENOSCOPY  11/03/2012   Procedure: ESOPHAGOGASTRODUODENOSCOPY (EGD);  Surgeon: Charolett Bumpers, MD;  Location: [facility];  Service: Endoscopy;  Laterality: N/A;  . implanted loop device    . RIGHT HEART CATH AND CORONARY ANGIOGRAPHY N/A 07/07/2020    Procedure: RIGHT HEART CATH AND CORONARY ANGIOGRAPHY;  Surgeon: Tonny Bollman, MD;  Location: [facility] INVASIVE CV LAB;  Service: Cardiovascular;  Laterality: N/A;  . TEE WITHOUT CARDIOVERSION N/A 07/27/2020   Procedure: TRANSESOPHAGEAL ECHOCARDIOGRAM (TEE);  Surgeon: Alleen Borne, MD;  Location: [facility] OR;  Service: Open Heart Surgery;  Laterality: N/A;  . TOTAL HIP ARTHROPLASTY Right 11/23/2019   Procedure: RIGHT TOTAL HIP ARTHROPLASTY ANTERIOR APPROACH;  Surgeon: Marcene Corning, MD;  Location: [facility] ORS;  Service: Orthopedics;  Laterality: Right;    Current Outpatient Medications  Medication Sig Dispense Refill  . acetaminophen (TYLENOL) 500 MG tablet Take 500-1,000 mg by mouth every 6 (six) hours as needed (for pain.).    Marland Kitchen Ascorbic Acid (VITAMIN C WITH ROSE HIPS) 500 MG tablet Take 500 mg by mouth daily.    Marland Kitchen aspirin EC 325 MG EC tablet Take 1 tablet (325 mg total) by mouth daily. 30 tablet 0  . b complex vitamins tablet Take 1 tablet by mouth daily.    . Calcium Carb-Cholecalciferol (CALCIUM+D3 PO) Take 1 tablet by mouth daily.    Marland Kitchen EPINEPHrine (EPIPEN 2-PAK) 0.3 mg/0.3 mL IJ SOAJ injection Inject 0.3 mg into the muscle as needed for anaphylaxis.    . folic acid (FOLVITE) 1 MG tablet Take 1 mg by mouth daily.    . Galcanezumab-gnlm (EMGALITY) 120 MG/ML SOAJ Inject 120 mg into the skin every 30 (thirty) days. 1.12 mL 11  .  Ginkgo Biloba 120 MG TABS Take 120 mg by mouth daily.    . Glucosamine-Chondroitin (COSAMIN DS PO) Take 1-2 tablets by mouth See admin instructions. Take 2 tablets by mouth in the morning & take 1 tablet by mouth at night.    . metoprolol tartrate (LOPRESSOR) 50 MG tablet Take 1 tablet (50 mg total) by mouth 2 (two) times daily. 60 tablet 3  . Multiple Vitamin (MULTIVITAMIN WITH MINERALS) TABS tablet Take 1 tablet by mouth daily.    . Multiple Vitamins-Minerals (EMERGEN-C IMMUNE PLUS) PACK Take 1 packet by mouth daily.    . Omega-3 1000 MG CAPS Take 2,000 mg by mouth daily.      Marland Kitchen omeprazole (PRILOSEC) 20 MG capsule Take 20 mg by mouth daily before breakfast.     . Polyethyl Glycol-Propyl Glycol (LUBRICANT EYE DROPS) 0.4-0.3 % SOLN Place 1 drop into both eyes 3 (three) times daily as needed (dry/irritated eyes.).    Marland Kitchen Rimegepant Sulfate (NURTEC) 75 MG TBDP Take 75 mg by mouth daily as needed. For migraines. Take as close to onset of migraine as possible. One daily maximum. 8 tablet 6  . Turmeric 500 MG TABS Take 1,000 mg by mouth daily.    . valsartan-hydrochlorothiazide (DIOVAN-HCT) 160-12.5 MG tablet Take 1 tablet by mouth daily.      No current facility-administered medications for this visit.    Allergies  Allergen Reactions  . Mobic [Meloxicam] Shortness Of Breath  . Penicillins Anaphylaxis, Hives and Other (See Comments)    Has patient had a PCN reaction causing immediate rash, facial/tongue/throat swelling, SOB or lightheadedness with hypotension: No Has patient had a PCN reaction causing severe rash involving mucus membranes or skin necrosis:  No Has patient had a PCN reaction that required hospitalization No Has patient had a PCN reaction occurring within the last 10 years: No If all of the above answers are "NO", then may proceed with Cephalosporin use.  . Codeine     STOMACH SICKNESS  . Nsaids Itching and Nausea And Vomiting    Social History   Socioeconomic History  . Marital status: Widowed    Spouse name: Not on file  . Number of children: Not on file  . Years of education: Not on file  . Highest education level: Not on file  Occupational History  . Not on file  Tobacco Use  . Smoking status: Never Smoker  . Smokeless tobacco: Never Used  Vaping Use  . Vaping Use: Never used  Substance and Sexual Activity  . Alcohol use: Yes    Comment: OCCASIONALY  . Drug use: No  . Sexual activity: Not on file  Other Topics Concern  . Not on file  Social History Narrative  . Not on file   Social Determinants of Health   Financial Resource  Strain:   . Difficulty of Paying Living Expenses: Not on file  Food Insecurity:   . Worried About Programme researcher, broadcasting/film/video in the Last Year: Not on file  . Ran Out of Food in the Last Year: Not on file  Transportation Needs:   . Lack of Transportation (Medical): Not on file  . Lack of Transportation (Non-Medical): Not on file  Physical Activity:   . Days of Exercise per Week: Not on file  . Minutes of Exercise per Session: Not on file  Stress:   . Feeling of Stress : Not on file  Social Connections:   . Frequency of Communication with Friends and Family: Not on file  .  Frequency of Social Gatherings with Friends and Family: Not on file  . Attends Religious Services: Not on file  . Active Member of Clubs or Organizations: Not on file  . Attends Banker Meetings: Not on file  . Marital Status: Not on file  Intimate Partner Violence:   . Fear of Current or Ex-Partner: Not on file  . Emotionally Abused: Not on file  . Physically Abused: Not on file  . Sexually Abused: Not on file     Review of Systems: General: No chills, fever, night sweats or weight changes  Cardiovascular:  No chest pain, dyspnea on exertion, edema, orthopnea, palpitations, paroxysmal nocturnal dyspnea Dermatological: No rash, lesions or masses Respiratory: No cough, dyspnea Urologic: No hematuria, dysuria Abdominal: No nausea, vomiting, diarrhea, bright red blood per rectum, melena, or hematemesis Neurologic: No visual changes, weakness, changes in mental status All other systems reviewed and are otherwise negative except as noted above.  Physical Exam: Vitals:   09/05/20 0959  BP: 102/70  Pulse: 62  SpO2: 97%  Weight: 189 lb (85.7 kg)    GEN- The patient is well appearing, alert and oriented x 3 today.   HEENT: normocephalic, atraumatic; sclera clear, conjunctiva pink; hearing intact; oropharynx clear; neck supple, no JVP Lymph- no cervical lymphadenopathy Lungs- Clear to ausculation  bilaterally, normal work of breathing.  No wheezes, rales, rhonchi Heart- Regular rate and rhythm, no murmurs, rubs or gallops, PMI not laterally displaced GI- soft, non-tender, non-distended, bowel sounds present, no hepatosplenomegaly Extremities- no clubbing, cyanosis, or edema; DP/PT/radial pulses 2+ bilaterally MS- no significant deformity or atrophy Skin- warm and dry, no rash or lesion Psych- euthymic mood, full affect Neuro- strength and sensation are intact  EKG is ordered. Personal review of EKG from today shows NSR at 62 bpm, PR interval 190 ms, QRS 90 ms  Additional studies reviewed include: Previous EP notes  Assessment and Plan:  1. 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 Would limit where possible.  Echo 07/2020 LVEF 65-70%. Planning for repeat next month post AVR  2. 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 No evidence of conduction disease on EKG today. Suspect was multifactorial in setting of surgery and AV nodal blockade. Limit where possible. BB decreased today.  Follow up with Dr. Eldridge Dace as above. EP to see as needed with resolution of her conduction issues. She may end up needing a pacemaker in the future with valvular disease and known slowing with AV nodal blockers.  No indication at this time.   Graciella Freer, PA-C  09/05/20 10:07 AM

## 2020-09-05 NOTE — Patient Instructions (Signed)
Medication Instructions:  Your physician has recommended you make the following change in your medication:  -- DECREASE Metoprolol Tartrate (Lopressor) to 25 mg - Take 1 tablet (25 mg) by mouth twice daily -- NEW RX SENT *If you need a refill on your cardiac medications before your next appointment, please call your pharmacy*  Follow-Up: At [facility], you and your health needs are our priority.  As part of our continuing mission to provide you with exceptional heart care, we have created designated Provider Care Teams.  These Care Teams include your primary Cardiologist (physician) and Advanced Practice Providers (APPs -  Physician Assistants and Nurse Practitioners) who all work together to provide you with the care you need, when you need it.  We recommend signing up for the patient portal called "MyChart".  Sign up information is provided on this After Visit Summary.  MyChart is used to connect with patients for Virtual Visits (Telemedicine).  Patients are able to view lab/test results, encounter notes, upcoming appointments, etc.  Non-urgent messages can be sent to your provider as well.   To learn more about what you can do with MyChart, go to [email or web address].    Your next appointment:   Your physician recommends that you follow up with Dr. Johney Frame or and of the EP APP as needed.  The format for your next appointment:   You may see Hillis Range, MDor one of the following Advanced Practice Providers on your designated Care Team:    Gypsy Balsam, NP  Francis Dowse, PA-C  Casimiro Needle "[place]" [place], [place]

## 2020-09-12 NOTE — Telephone Encounter (Signed)
Patient called stating that Emgality cost too much for her and she is wondering if she can go a month with out or if there is something else cheaper that she can try.

## 2020-09-13 NOTE — Telephone Encounter (Signed)
I also completed an appeal on Cover My Meds. KEY: [id number]. Awaiting determination from [facility].

## 2020-09-13 NOTE — Telephone Encounter (Signed)
I spoke with the patient. She stated she had spoken with the [facility] and unfortunately she does not qualify. However she did state that she still has one more dose of Emgality to take and she is in the process of changing insurance plans. Therefore, we can do PA at beginning of 2022 and pt is hopeful this will be more affordable at that time. Pt's questions were answered during the call. Her MRI and sleep study were both scheduled the week of her surgery so she is going to check with cardiologist to be sure they are ok before she reschedules. The pt is very pleased with Emgality and states she is no longer having morning headaches. She also has Nurtec leftover if needed. Pt aware Nurtec appeal is pending and we will contact her with determination. She verbalized appreciation for the call.

## 2020-09-18 NOTE — Telephone Encounter (Signed)
[employer] approved the Nurtec through 10/06/2021. I faxed the notice to the pt's pharmacy. Received a receipt of confirmation. Notified patient via mychart.

## 2020-09-19 ENCOUNTER — Telehealth (HOSPITAL_COMMUNITY): Payer: Self-pay

## 2020-09-19 NOTE — Telephone Encounter (Signed)
Called and spoke with pt in regards to CR, pt stated she is not able to participate at this time due to her work schedule.  Closed referral

## 2020-09-26 ENCOUNTER — Other Ambulatory Visit (HOSPITAL_COMMUNITY): Payer: Medicare Other

## 2020-09-27 ENCOUNTER — Telehealth (HOSPITAL_COMMUNITY): Payer: Self-pay | Admitting: Internal Medicine

## 2020-09-27 ENCOUNTER — Encounter: Payer: Self-pay | Admitting: *Deleted

## 2020-10-10 ENCOUNTER — Telehealth (HOSPITAL_COMMUNITY): Payer: Self-pay

## 2020-10-10 NOTE — Telephone Encounter (Signed)
Pt called and stated she is interested in CR now. Patient will come in for orientation on 11/23/20 @ 215PM and will attend the 3:15PM exercise class. Went over insurance, patient verbalized understanding.   Pensions consultant.

## 2020-10-17 ENCOUNTER — Other Ambulatory Visit: Payer: Self-pay

## 2020-10-17 ENCOUNTER — Ambulatory Visit (HOSPITAL_COMMUNITY): Payer: Medicare Other | Attending: Cardiovascular Disease

## 2020-10-17 DIAGNOSIS — I350 Nonrheumatic aortic (valve) stenosis: Secondary | ICD-10-CM | POA: Diagnosis present

## 2020-10-17 LAB — ECHOCARDIOGRAM COMPLETE
AR max vel: 2.38 cm2
AV Area VTI: 2.54 cm2
AV Area mean vel: 2.25 cm2
AV Mean grad: 10 mmHg
AV Peak grad: 14.6 mmHg
Ao pk vel: 1.91 m/s
Area-P 1/2: 2.13 cm2
S' Lateral: 2.2 cm

## 2020-10-18 ENCOUNTER — Telehealth: Payer: Self-pay

## 2020-10-18 NOTE — Telephone Encounter (Signed)
The patient has been notified of the result and verbalized understanding.  All questions (if any) were answered. Leanord Hawking, RN 10/18/2020 3:44 PM

## 2020-10-18 NOTE — Telephone Encounter (Signed)
-----   Message from Corky Crafts, MD sent at 10/18/2020  3:15 PM EST ----- Normal LV function and normal aortic valve function.  She needs SBE prophylaxis for dental treatments.

## 2020-10-19 ENCOUNTER — Ambulatory Visit: Payer: Medicare Other | Admitting: Neurology

## 2020-10-20 ENCOUNTER — Other Ambulatory Visit: Payer: Self-pay

## 2020-10-20 MED ORDER — DOXYCYCLINE HYCLATE 50 MG PO CAPS: ORAL_CAPSULE | ORAL | 3 refills | Status: DC

## 2020-10-23 ENCOUNTER — Encounter: Payer: Self-pay | Admitting: *Deleted

## 2020-10-23 ENCOUNTER — Encounter: Payer: Self-pay | Admitting: Surgery

## 2020-11-04 ENCOUNTER — Telehealth (HOSPITAL_COMMUNITY): Payer: Self-pay | Admitting: Student-PharmD

## 2020-11-04 NOTE — Telephone Encounter (Signed)
Cardiac Rehab Medication Review by a Pharmacist  Does the patient  feel that his/her medications are working for him/her?  yes  Has the patient been experiencing any side effects to the medications prescribed?  Yes - chronic dry cough   Does the patient measure his/her own blood pressure or blood glucose at home?  yes - BP is typically 115-120 / 78-80 mmHg  Does the patient have any problems obtaining medications due to transportation or finances?   no  Understanding of regimen: good Understanding of indications: good Potential of compliance: good  Pharmacist Intervention: Patient's chronic cough could possibly be an adverse effect from valsartan - discussed alternatives and recommended patient to make PCP aware.  Yvetta Coder, PharmD PGY1 Acute Care Pharmacy Resident

## 2020-11-07 ENCOUNTER — Telehealth: Payer: Self-pay | Admitting: *Deleted

## 2020-11-07 NOTE — Telephone Encounter (Signed)
Per Cover My Meds, Approved today Effective from 11/07/2020 through 11/07/2021.

## 2020-11-07 NOTE — Telephone Encounter (Signed)
Spoke with [facility] and obtained pharmacy benefit information:  BIN: [id number] PCN: [id number] Group: [id number] ID: [id number] Help desk # [phone]  I completed the Emgality PA on Cover My Meds. Key: [id number]. Awaiting determination from [facility] Medicare Part D.

## 2020-11-16 DIAGNOSIS — R0602 Shortness of breath: Secondary | ICD-10-CM

## 2020-11-16 DIAGNOSIS — R5383 Other fatigue: Secondary | ICD-10-CM

## 2020-11-21 ENCOUNTER — Ambulatory Visit: Payer: Medicare Other | Admitting: Interventional Cardiology

## 2020-11-22 ENCOUNTER — Telehealth (HOSPITAL_COMMUNITY): Payer: Self-pay | Admitting: *Deleted

## 2020-11-22 ENCOUNTER — Encounter (HOSPITAL_COMMUNITY): Payer: Self-pay | Admitting: *Deleted

## 2020-11-22 NOTE — Telephone Encounter (Signed)
Left message to call cardiac rehab.Gladstone Lighter, RN,BSN 11/22/2020 11:43 AM

## 2020-11-22 NOTE — Progress Notes (Signed)
Called pt to remind her of  Cardiac Rehab orientation appointment and completed health history. We discussed mask, proper shoes, directions to the department, our contact number and Covid questions. She voices understanding. During our discussion she mentioned that she got up this morning to quickly and had a fall. She states that she hit her head but the her daughter stayed home with her today to watch her. She has not had anymore dizziness or falls and has felt well the rest of the day. I cautioned her to go to the ED if she had any symptoms of further dizziness, confusion or headache. She voices understanding.

## 2020-11-23 ENCOUNTER — Encounter (HOSPITAL_COMMUNITY): Payer: Self-pay

## 2020-11-23 ENCOUNTER — Other Ambulatory Visit: Payer: Self-pay

## 2020-11-23 ENCOUNTER — Encounter (HOSPITAL_COMMUNITY)
Admission: RE | Admit: 2020-11-23 | Discharge: 2020-11-23 | Disposition: A | Discharge status: Home / Self Care | Payer: Medicare Other | Source: Ambulatory Visit | Attending: Interventional Cardiology | Admitting: Interventional Cardiology

## 2020-11-23 VITALS — BP 118/78 | Ht 65.25 in | Wt 194.4 lb

## 2020-11-23 DIAGNOSIS — Z952 Presence of prosthetic heart valve: Secondary | ICD-10-CM | POA: Insufficient documentation

## 2020-11-23 NOTE — Progress Notes (Signed)
Cardiac Individual Treatment Plan  Patient Details  Name: Rebecca CoffeeCarol L Sorlie MRN: [id number] Date of Birth: 09/01/1956 Referring Provider:   Flowsheet Row CARDIAC REHAB PHASE II ORIENTATION [facility]23/2020 [facility]  Referring Provider Lance MussJayadeep Varanasi, MD      Initial Encounter Date:  Flowsheet Row CARDIAC REHAB [facility]ASE [facility]  Date 11/23/20      Visit Diagnosis: 07/27/20 S/P AVR  Patient's Home Medications on Admission:  Current Outpatient Medications:  .  acetaminophen (TYLENOL) 500 MG tablet, Take 500-1,000 mg by mouth every 6 (six) hours as needed (for pain.)., Disp: , Rfl:  .  Ascorbic Acid (VITAMIN C WITH ROSE HIPS) 500 MG tablet, Take 500 mg by mouth daily., Disp: , Rfl:  .  aspirin EC 325 MG EC tablet, Take 1 tablet (325 mg total) by mouth daily., Disp: 30 tablet, Rfl: 0 .  b complex vitamins tablet, Take 1 tablet by mouth daily., Disp: , Rfl:  .  Calcium Carb-Cholecalciferol (CALCIUM+D3 PO), Take 1 tablet by mouth daily., Disp: , Rfl:  .  doxycycline (VIBRAMYCIN) 50 MG capsule, Take 2 capsules 1 hour prior to any dental procedures (Patient not taking: Reported on 11/04/2020), Disp: 2 capsule, Rfl: 3 .  EPINEPHrine 0.3 mg/0.3 mL IJ SOAJ injection, Inject 0.3 mg into the muscle as needed for anaphylaxis., Disp: , Rfl:  .  folic acid (FOLVITE) 1 MG tablet, Take 1 mg by mouth daily., Disp: , Rfl:  .  Galcanezumab-gnlm (EMGALITY) 120 MG/ML SOAJ, Inject 120 mg into the skin every 30 (thirty) days., Disp: 1.12 mL, Rfl: 11 .  Ginkgo Biloba 120 MG TABS, Take 120 mg by mouth daily., Disp: , Rfl:  .  Glucosamine-Chondroitin (COSAMIN DS PO), Take 1-2 tablets by mouth See admin instructions. Take 2 tablets by mouth in the morning & take 1 tablet by mouth at night., Disp: , Rfl:  .  metoprolol tartrate (LOPRESSOR) 25 MG tablet, Take 1 tablet (25 mg total) by mouth 2 (two) times daily., Disp:  180 tablet, Rfl: 3 .  Multiple Vitamin (MULTIVITAMIN WITH MINERALS) TABS tablet, Take 1 tablet by mouth daily., Disp: , Rfl:  .  Multiple Vitamins-Minerals (EMERGEN-C IMMUNE PLUS) PACK, Take 1 packet by mouth daily., Disp: , Rfl:  .  Omega-3 1000 MG CAPS, Take 2,000 mg by mouth daily., Disp: , Rfl:  .  omeprazole (PRILOSEC) 20 MG capsule, Take 20 mg by mouth daily before breakfast., Disp: , Rfl:  .  Polyethyl Glycol-Propyl Glycol (LUBRICANT EYE DROPS) 0.4-0.3 % SOLN, Place 1 drop into both eyes 3 (three) times daily as needed (dry/irritated eyes.)., Disp: , Rfl:  .  Rimegepant Sulfate (NURTEC) 75 MG TBDP, Take 75 mg by mouth daily as needed. For migraines. Take as close to onset of migraine as possible. One daily maximum., Disp: 8 tablet, Rfl: 6 .  Turmeric 500 MG TABS, Take 1,000 mg by mouth daily., Disp: , Rfl:  .  valsartan-hydrochlorothiazide (DIOVAN-HCT) 160-12.5 MG tablet, Take 1 tablet by mouth daily. , Disp: , Rfl:   Past Medical History: Past Medical History:  Diagnosis Date  . Atrial flutter (HCC) 01/2008   RADIOFREQUENCY ABLATION [place]  . Barrett esophagus   . Bee sting allergy   . Diverticulitis   . DJD (degenerative joint disease) of knee    KNEES, BACK (LUMBAR FACET DISEASE) AND HIPS, DALLDORF.  Marland Kitchen. GERD (gastroesophageal reflux disease)   . Hiatal hernia   .  History of syncope    RECURRENT  . Hyperlipidemia    BORDERLINE  . Hypertension   . Migraines   . Obesity   . Osteopenia   . S/P RF ablation operation for arrhythmia   . Severe aortic stenosis   . Shoulder tendinitis, left 2008    Tobacco Use: Social History   Tobacco Use  Smoking Status Never Smoker  Smokeless Tobacco Never Used    Labs: Recent Review Flowsheet Data    Labs for ITP Cardiac and Pulmonary Rehab Latest Ref Rng & Units 07/27/2020 07/27/2020 07/27/2020 07/27/2020 07/27/2020   Hemoglobin A1c 4.8 - 5.6 % - - - - -   PHART 7.350 - 7.450 7.397 - 7.357 7.402 7.347(L)   PCO2ART 32.0  - 48.0 mmHg 47.5 - 43.2 41.3 43.9   HCO3 20.0 - 28.0 mmol/L 29.3(H) - 24.8 25.8 24.1   TCO2 22 - 32 mmol/L ACIDBASEDEF 0.0 - 2.0 mmol/L - - 1.0 - 2.0   O2SAT % 100.0 - 98.0 99.0 97.0      Capillary Blood Glucose: Lab Results  Component Value Date   GLUCAP 125 (H) 07/29/2020   GLUCAP 120 (H) 07/28/2020   GLUCAP 123 (H) 07/28/2020   GLUCAP 131 (H) 07/28/2020   GLUCAP 129 (H) 07/28/2020     Exercise Target Goals: Exercise Program Goal: Individual exercise prescription set using results from initial 6 min walk test and THRR while considering  patient's activity barriers and safety.   Exercise Prescription Goal: Starting with aerobic activity 30 plus minutes a day, 3 days per week for initial exercise prescription. Provide home exercise prescription and guidelines that participant acknowledges understanding prior to discharge.  Activity Barriers & Risk Stratification:  Activity Barriers & Cardiac Risk Stratification - 11/23/20 1559      Activity Barriers & Cardiac Risk Stratification   Activity Barriers Arthritis;Back Problems;Shortness of Breath;Right Hip Replacement;Joint Problems;Deconditioning;History of Falls;Balance Concerns    Cardiac Risk Stratification High           6 Minute Walk:  6 Minute Walk    Row Name 11/23/20 1541         6 Minute Walk   Phase Initial     Distance 1093 feet     Walk Time 6 minutes     # of Rest Breaks 0     MPH 2.07     METS 2.54     RPE 11     Perceived Dyspnea  2     VO2 Peak 8.89     Symptoms No     Resting HR 90 bpm     Resting BP 118/78     Resting Oxygen Saturation  99 %     Exercise Oxygen Saturation  during 6 min walk 98 %     Max Ex. HR 106 bpm     Max Ex. BP 132/78     2 Minute Post BP 116/80            Oxygen Initial Assessment:   Oxygen Re-Evaluation:   Oxygen Discharge (Final Oxygen Re-Evaluation):   Initial Exercise Prescription:  Initial Exercise Prescription - 11/23/20 1500       Date of Initial Exercise RX and Referring Provider   Date 11/23/20    Referring Provider Lance Muss, MD    Expected Discharge Date 01/19/21      NuStep   Level 2    SPM 80    Minutes 25  METs 1.5      Prescription Details   Frequency (times per week) 3    Duration Progress to 30 minutes of continuous aerobic without signs/symptoms of physical distress      Intensity   THRR 40-80% of Max Heartrate 62-123    Ratings of Perceived Exertion 11-13    Perceived Dyspnea 0-4      Progression   Progression Continue progressive overload as per policy without signs/symptoms or physical distress.      Resistance Training   Training Prescription Yes    Weight 2    Reps 10-15           Perform Capillary Blood Glucose checks as needed.  Exercise Prescription Changes:   Exercise Comments:   Exercise Goals and Review:  Exercise Goals    Row Name 11/23/20 1531             Exercise Goals   Increase Physical Activity Yes       Intervention Provide advice, education, support and counseling about physical activity/exercise needs.;Develop an individualized exercise prescription for aerobic and resistive training based on initial evaluation findings, risk stratification, comorbidities and participant's personal goals.       Expected Outcomes Short Term: Attend rehab on a regular basis to increase amount of physical activity.;Long Term: Add in home exercise to make exercise part of routine and to increase amount of physical activity.;Long Term: Exercising regularly at least 3-5 days a week.       Increase Strength and Stamina Yes       Intervention Provide advice, education, support and counseling about physical activity/exercise needs.;Develop an individualized exercise prescription for aerobic and resistive training based on initial evaluation findings, risk stratification, comorbidities and participant's personal goals.       Expected Outcomes Short Term: Increase workloads  from initial exercise prescription for resistance, speed, and METs.;Long Term: Improve cardiorespiratory fitness, muscular endurance and strength as measured by increased METs and functional capacity ( );Short Term: Perform resistance training exercises routinely during rehab and add in resistance training at home       Able to understand and use rate of perceived exertion (RPE) scale Yes       Intervention Provide education and explanation on how to use RPE scale       Expected Outcomes Short Term: Able to use RPE daily in rehab to express subjective intensity level;Long Term:  Able to use RPE to guide intensity level when exercising independently       Knowledge and understanding of Target Heart Rate Range (THRR) Yes       Intervention Provide education and explanation of THRR including how the numbers were predicted and where they are located for reference       Expected Outcomes Short Term: Able to state/look up THRR;Long Term: Able to use THRR to govern intensity when exercising independently;Short Term: Able to use daily as guideline for intensity in rehab       Understanding of Exercise Prescription Yes       Intervention Provide education, explanation, and written materials on patient's individual exercise prescription       Expected Outcomes Short Term: Able to explain program exercise prescription;Long Term: Able to explain home exercise prescription to exercise independently              Exercise Goals Re-Evaluation :    Discharge Exercise Prescription (Final Exercise Prescription Changes):   Nutrition:  Target Goals: Understanding of nutrition guidelines, daily intake of sodium 1500mg , cholesterol <  , calories 30% from fat and 7% or less from saturated fats, daily to have 5 or more servings of fruits and vegetables.  Biometrics:  Pre Biometrics - 11/23/20 1415      Pre Biometrics   Waist Circumference 41.5 inches    Hip Circumference 46.5 inches    Waist to Hip  Ratio 0.89 %    Triceps Skinfold 47 mm    % Body Fat 46.1 %    Grip Strength 29 kg    Flexibility 15.75 in    Single Leg Stand 1 seconds   High fall risk           Nutrition Therapy Plan and Nutrition Goals:   Nutrition Assessments:  MEDIFICTS Score Key:  ?70 Need to make dietary changes   40-70 Heart Healthy Diet  ? 40 Therapeutic Level Cholesterol Diet   Picture Your Plate Scores:  <16 Unhealthy dietary pattern with much room for improvement.  41-50 Dietary pattern unlikely to meet recommendations for good health and room for improvement.  51-60 More healthful dietary pattern, with some room for improvement.   >60 Healthy dietary pattern, although there may be some specific behaviors that could be improved.    Nutrition Goals Re-Evaluation:   Nutrition Goals Discharge (Final Nutrition Goals Re-Evaluation):   Psychosocial: Target Goals: Acknowledge presence or absence of significant depression and/or stress, maximize coping skills, provide positive support system. Participant is able to verbalize types and ability to use techniques and skills needed for reducing stress and depression.  Initial Review & Psychosocial Screening:  Initial Psych Review & Screening - 11/23/20 1501      Initial Review   Current issues with Current Stress Concerns    Source of Stress Concerns Unable to participate in former interests or hobbies;Unable to perform yard/household activities;Occupation    Comments Lenia does not have the energy she thought she should have after surgery and a stressful job      Family Dynamics   Good Support System? Yes   Bless lives with her daughter who she has for support     Barriers   Psychosocial barriers to participate in program The patient should benefit from training in stress management and relaxation.      Screening Interventions   Interventions Encouraged to exercise    Expected Outcomes Long Term Goal: Stressors or current issues are  controlled or eliminated.           Quality of Life Scores:  Quality of Life - 11/23/20 1533      Quality of Life   Select Quality of Life      Quality of Life Scores   Health/Function Pre 24.64 %    Socioeconomic Pre 24.71 %    Psych/Spiritual Pre 22.71 %    Family Pre 25.5 %    GLOBAL Pre 24.34 %          Scores of 19 and below usually indicate a poorer quality of life in these areas.  A difference of  2-3 points is a clinically meaningful difference.  A difference of 2-3 points in the total score of the Quality of Life Index has been associated with significant improvement in overall quality of life, self-image, physical symptoms, and general health in studies assessing change in quality of life.  PHQ-9: Recent Review Flowsheet Data    Depression screen [facility] 2/9 11/23/2020   Decreased Interest 0   Down, Depressed, Hopeless 0   PHQ - 2 Score 0  Interpretation of Total Score  Total Score Depression Severity:  1-4 = Minimal depression, 5-9 = Mild depression, 10-14 = Moderate depression, 15-19 = Moderately severe depression, 20-27 = Severe depression   Psychosocial Evaluation and Intervention:   Psychosocial Re-Evaluation:   Psychosocial Discharge (Final Psychosocial Re-Evaluation):   Vocational Rehabilitation: Provide vocational rehab assistance to qualifying candidates.   Vocational Rehab Evaluation & Intervention:  Vocational Rehab - 11/23/20 1603      Initial Vocational Rehab Evaluation & Intervention   Assessment shows need for Vocational Rehabilitation No   Ajani works full time at [employer] and does not need vocational rehab at this time          Education: Education Goals: Education classes will be provided on a weekly basis, covering required topics. Participant will state understanding/return demonstration of topics presented.  Learning Barriers/Preferences:  Learning Barriers/Preferences - 11/23/20 1528      Learning Barriers/Preferences    Learning Barriers Sight   wears glasses   Learning Preferences Video;Skilled Demonstration;Pictoral;Individual Instruction;Group Instruction;Computer/Internet           Education Topics: Hypertension, Hypertension Reduction -Define heart disease and high blood pressure. Discus how high blood pressure affects the body and ways to reduce high blood pressure.   Exercise and Your Heart -Discuss why it is important to exercise, the FITT principles of exercise, normal and abnormal responses to exercise, and how to exercise safely.   Angina -Discuss definition of angina, causes of angina, treatment of angina, and how to decrease risk of having angina.   Cardiac Medications -Review what the following cardiac medications are used for, how they affect the body, and side effects that may occur when taking the medications.  Medications include Aspirin, Beta blockers, calcium channel blockers, ACE Inhibitors, angiotensin receptor blockers, diuretics, digoxin, and antihyperlipidemics.   Congestive Heart Failure -Discuss the definition of CHF, how to live with CHF, the signs and symptoms of CHF, and how keep track of weight and sodium intake.   Heart Disease and Intimacy -Discus the effect sexual activity has on the heart, how changes occur during intimacy as we age, and safety during sexual activity.   Smoking Cessation / COPD -Discuss different methods to quit smoking, the health benefits of quitting smoking, and the definition of COPD.   Nutrition I: Fats -Discuss the types of cholesterol, what cholesterol does to the heart, and how cholesterol levels can be controlled.   Nutrition II: Labels -Discuss the different components of food labels and how to read food label   Heart Parts/Heart Disease and PAD -Discuss the anatomy of the heart, the pathway of blood circulation through the heart, and these are affected by heart disease.   Stress I: Signs and Symptoms -Discuss the causes  of stress, how stress may lead to anxiety and depression, and ways to limit stress.   Stress II: Relaxation -Discuss different types of relaxation techniques to limit stress.   Warning Signs of Stroke / TIA -Discuss definition of a stroke, what the signs and symptoms are of a stroke, and how to identify when someone is having stroke.   Knowledge Questionnaire Score:  Knowledge Questionnaire Score - 11/23/20 1533      Knowledge Questionnaire Score   Pre Score 20/24           Core Components/Risk Factors/Patient Goals at Admission:  Personal Goals and Risk Factors at Admission - 11/23/20 1529      Core Components/Risk Factors/Patient Goals on Admission    Weight Management Yes;Obesity;Weight Loss  Intervention Weight Management: Develop a combined nutrition and exercise program designed to reach desired caloric intake, while maintaining appropriate intake of nutrient and fiber, sodium and fats, and appropriate energy expenditure required for the weight goal.;Weight Management: Provide education and appropriate resources to help participant work on and attain dietary goals.;Weight Management/Obesity: Establish reasonable short term and long term weight goals.;Obesity: Provide education and appropriate resources to help participant work on and attain dietary goals.    Admit Weight 194 lb 7.1 oz (88.2 kg)    Expected Outcomes Short Term: Continue to assess and modify interventions until short term weight is achieved;Long Term: Adherence to nutrition and physical activity/exercise program aimed toward attainment of established weight goal;Weight Maintenance: Understanding of the daily nutrition guidelines, which includes 25-35% calories from fat, 7% or less cal from saturated fats, less than 200mg  cholesterol, less than 1.5gm of sodium, & 5 or more servings of fruits and vegetables daily;Weight Loss: Understanding of general recommendations for a balanced deficit meal plan, which promotes 1-2  lb weight loss per week and includes a negative energy balance of [phone] kcal/d;Understanding recommendations for meals to include 15-35% energy as protein, 25-35% energy from fat, 35-60% energy from carbohydrates, less than 200mg  of dietary cholesterol, 20-35 gm of total fiber daily;Understanding of distribution of calorie intake throughout the day with the consumption of 4-5 meals/snacks    Improve shortness of breath with ADL's Yes    Expected Outcomes Short Term: Improve cardiorespiratory fitness to achieve a reduction of symptoms when performing ADLs;Long Term: Be able to perform more ADLs without symptoms or delay the onset of symptoms    Hypertension Yes    Intervention Provide education on lifestyle modifcations including regular physical activity/exercise, weight management, moderate sodium restriction and increased consumption of fresh fruit, vegetables, and low fat dairy, alcohol moderation, and smoking cessation.;Monitor prescription use compliance.    Expected Outcomes Short Term: Continued assessment and intervention until BP is < 140/60mm HG in hypertensive participants. < 130/23mm HG in hypertensive participants with diabetes, heart failure or chronic kidney disease.;Long Term: Maintenance of blood pressure at goal levels.    Lipids Yes    Intervention Provide education and support for participant on nutrition & aerobic/resistive exercise along with prescribed medications to achieve LDL 70mg , HDL >40mg .    Expected Outcomes Short Term: Participant states understanding of desired cholesterol values and is compliant with medications prescribed. Participant is following exercise prescription and nutrition guidelines.;Long Term: Cholesterol controlled with medications as prescribed, with individualized exercise RX and with personalized nutrition plan. Value goals: LDL < 70mg , HDL > 40 mg.    Stress Yes    Intervention Offer individual and/or small group education and counseling on adjustment  to heart disease, stress management and health-related lifestyle change. Teach and support self-help strategies.    Expected Outcomes Short Term: Participant demonstrates changes in health-related behavior, relaxation and other stress management skills, ability to obtain effective social support, and compliance with psychotropic medications if prescribed.;Long Term: Emotional wellbeing is indicated by absence of clinically significant psychosocial distress or social isolation.           Core Components/Risk Factors/Patient Goals Review:    Core Components/Risk Factors/Patient Goals at Discharge (Final Review):    ITP Comments:  ITP Comments    Row Name 11/23/20 1500           ITP Comments Dr 91m MD, Medical Director              Comments:Lakenya attended orientation on 11/23/2020  to review rules and guidelines for program.  Completed 6 minute walk test, Intitial ITP, and exercise prescription.  VSS. Telemetry-Sinus Rhythm. Miah reported having shortness of breath RPD 2 which resolved with rest otherwise Asymptomatic. Safety measures and social distancing in place per CDC guidelines.Gladstone Lighter, RN,BSN 11/23/2020 4:08 PM

## 2020-11-27 ENCOUNTER — Encounter (HOSPITAL_COMMUNITY)
Admission: RE | Admit: 2020-11-27 | Discharge: 2020-11-27 | Disposition: A | Discharge status: Home / Self Care | Payer: Medicare Other | Source: Ambulatory Visit | Attending: Interventional Cardiology | Admitting: Interventional Cardiology

## 2020-11-27 ENCOUNTER — Other Ambulatory Visit: Payer: Self-pay

## 2020-11-27 DIAGNOSIS — Z952 Presence of prosthetic heart valve: Secondary | ICD-10-CM | POA: Diagnosis not present

## 2020-11-27 NOTE — Progress Notes (Signed)
Daily Session Note  Patient Details  Name: Rebecca Farley MRN: [id number] Date of Birth: 04-15-1956 Referring Provider:   Flowsheet Row CARDIAC REHAB PHASE II ORIENTATION from 11/23/2020 in [facility]  Referring Provider Larae Grooms, MD      Encounter Date: 11/27/2020  Check In:  Session Check In - 11/27/20 1522      Check-In   Supervising physician immediately available to respond to emergencies [facility] immediately available    Physician(s) Dr Sloan Leiter    Location [facility]    Staff Present Barnet Pall, RN, Milus Glazier, MS, EP-C, CCRP;Other;Carlette Wilber Oliphant, RN, Isaac Laud, MS, ACSM-CEP, Exercise Physiologist;Olinty Celesta Aver, MS, ACSM CEP, Exercise Physiologist   Esmeralda Links, EP   Virtual Visit No    Medication changes reported     No    Fall or balance concerns reported    No    Tobacco Cessation No Change    Current number of cigarettes/nicotine per day     0    Warm-up and Cool-down Performed on first and last piece of equipment    Resistance Training Performed Yes    VAD Patient? No    PAD/SET Patient? No      Pain Assessment   Currently in Pain? No/denies    Pain Score 0-No pain    Multiple Pain Sites No           Capillary Blood Glucose: No results found for this or any previous visit (from the past 24 hour(s)).   Exercise Prescription Changes - 11/27/20 1627      Response to Exercise   Blood Pressure (Admit) 122/80    Blood Pressure (Exercise) 130/80    Blood Pressure (Exit) 120/72    Heart Rate (Admit) 98 bpm    Heart Rate (Exercise) 100 bpm    Heart Rate (Exit) 85 bpm    Rating of Perceived Exertion (Exercise) 13    Symptoms None    Comments Pt's first day of exercise in the CRP2 program    Duration Progress to 30 minutes of  aerobic without signs/symptoms of physical distress    Intensity THRR unchanged      Progression   Progression Continue to progress workloads to  maintain intensity without signs/symptoms of physical distress.    Average METs 2.5      Resistance Training   Training Prescription Yes    Weight 2 lbs    Reps 10-15    Time 10 Minutes      Interval Training   Interval Training No      NuStep   Level 2    SPM 90    Minutes 30    METs 2.5           Social History   Tobacco Use  Smoking Status Never Smoker  Smokeless Tobacco Never Used    Goals Met:  Exercise tolerated well No report of cardiac concerns or symptoms Strength training completed today  Goals Unmet:  Not Applicable  Comments: Lanett started cardiac rehab on 11/27/20.  Pt tolerated light exercise without difficulty. VSS, telemetry-Sinus Rhythm, asymptomatic.  Medication list reconciled. Pt denies barriers to medicaiton compliance.  PSYCHOSOCIAL ASSESSMENT:  PHQ-0. Pt exhibits positive coping skills, hopeful outlook with supportive family. No psychosocial needs identified at this time, no psychosocial interventions necessary.    Pt enjoys gardening, watching movies and outing with the family.   Pt oriented to exercise equipment and routine.    Understanding verbalized.Verdis Frederickson  Venetia Maxon, RN,BSN 11/28/2020 12:11 PM   Dr. Fransico Him is Medical Director for Cardiac Rehab at [facility].

## 2020-11-27 NOTE — Telephone Encounter (Signed)
I placed call to patient to follow up . Left message to call office.  

## 2020-11-29 ENCOUNTER — Other Ambulatory Visit: Payer: Self-pay

## 2020-11-29 ENCOUNTER — Encounter (HOSPITAL_COMMUNITY)
Admission: RE | Admit: 2020-11-29 | Discharge: 2020-11-29 | Disposition: A | Discharge status: Home / Self Care | Payer: Medicare Other | Source: Ambulatory Visit | Attending: Interventional Cardiology | Admitting: Interventional Cardiology

## 2020-11-29 DIAGNOSIS — Z952 Presence of prosthetic heart valve: Secondary | ICD-10-CM | POA: Diagnosis not present

## 2020-12-01 ENCOUNTER — Encounter (HOSPITAL_COMMUNITY)
Admission: RE | Admit: 2020-12-01 | Discharge: 2020-12-01 | Disposition: A | Discharge status: Home / Self Care | Payer: Medicare Other | Source: Ambulatory Visit | Attending: Interventional Cardiology | Admitting: Interventional Cardiology

## 2020-12-01 ENCOUNTER — Other Ambulatory Visit: Payer: Self-pay

## 2020-12-01 DIAGNOSIS — Z952 Presence of prosthetic heart valve: Secondary | ICD-10-CM | POA: Diagnosis not present

## 2020-12-04 ENCOUNTER — Encounter (HOSPITAL_COMMUNITY)
Admission: RE | Admit: 2020-12-04 | Discharge: 2020-12-04 | Disposition: A | Discharge status: Home / Self Care | Payer: Medicare Other | Source: Ambulatory Visit | Attending: Interventional Cardiology | Admitting: Interventional Cardiology

## 2020-12-04 ENCOUNTER — Other Ambulatory Visit: Payer: Self-pay

## 2020-12-04 DIAGNOSIS — Z952 Presence of prosthetic heart valve: Secondary | ICD-10-CM | POA: Diagnosis not present

## 2020-12-05 NOTE — Progress Notes (Signed)
Cardiac Individual Treatment Plan  Patient Details  Name: Rebecca Farley MRN: [id number] Date of Birth: Aug 02, 1956 Referring Provider:   Flowsheet Row CARDIAC REHAB PHASE II ORIENTATION from 11/23/2020 in [facility] CARDIAC REHAB  Referring Provider Lance Muss, MD      Initial Encounter Date:  Flowsheet Row CARDIAC REHAB PHASE II ORIENTATION from 11/23/2020 in [facility] CARDIAC REHAB  Date 11/23/20      Visit Diagnosis: 07/27/20 S/P AVR  Patient's Home Medications on Admission:  Current Outpatient Medications:    acetaminophen (TYLENOL) 500 MG tablet, Take 500-1,000 mg by mouth every 6 (six) hours as needed (for pain.)., Disp: , Rfl:    Ascorbic Acid (VITAMIN C WITH ROSE HIPS) 500 MG tablet, Take 500 mg by mouth daily., Disp: , Rfl:    aspirin EC 325 MG EC tablet, Take 1 tablet (325 mg total) by mouth daily., Disp: 30 tablet, Rfl: 0   b complex vitamins tablet, Take 1 tablet by mouth daily., Disp: , Rfl:    Calcium Carb-Cholecalciferol (CALCIUM+D3 PO), Take 1 tablet by mouth daily., Disp: , Rfl:    doxycycline (VIBRAMYCIN) 50 MG capsule, Take 2 capsules 1 hour prior to any dental procedures (Patient not taking: Reported on 11/04/2020), Disp: 2 capsule, Rfl: 3   EPINEPHrine 0.3 mg/0.3 mL IJ SOAJ injection, Inject 0.3 mg into the muscle as needed for anaphylaxis., Disp: , Rfl:    folic acid (FOLVITE) 1 MG tablet, Take 1 mg by mouth daily., Disp: , Rfl:    Galcanezumab-gnlm (EMGALITY) 120 MG/ML SOAJ, Inject 120 mg into the skin every 30 (thirty) days., Disp: 1.12 mL, Rfl: 11   Ginkgo Biloba 120 MG TABS, Take 120 mg by mouth daily., Disp: , Rfl:    Glucosamine-Chondroitin (COSAMIN DS PO), Take 1-2 tablets by mouth See admin instructions. Take 2 tablets by mouth in the morning & take 1 tablet by mouth at night., Disp: , Rfl:    metoprolol tartrate (LOPRESSOR) 25 MG tablet, Take 1 tablet (25 mg total) by mouth 2 (two) times daily., Disp:  180 tablet, Rfl: 3   Multiple Vitamin (MULTIVITAMIN WITH MINERALS) TABS tablet, Take 1 tablet by mouth daily., Disp: , Rfl:    Multiple Vitamins-Minerals (EMERGEN-C IMMUNE PLUS) PACK, Take 1 packet by mouth daily., Disp: , Rfl:    Omega-3 1000 MG CAPS, Take 2,000 mg by mouth daily., Disp: , Rfl:    omeprazole (PRILOSEC) 20 MG capsule, Take 20 mg by mouth daily before breakfast., Disp: , Rfl:    Polyethyl Glycol-Propyl Glycol (LUBRICANT EYE DROPS) 0.4-0.3 % SOLN, Place 1 drop into both eyes 3 (three) times daily as needed (dry/irritated eyes.)., Disp: , Rfl:    Rimegepant Sulfate (NURTEC) 75 MG TBDP, Take 75 mg by mouth daily as needed. For migraines. Take as close to onset of migraine as possible. One daily maximum., Disp: 8 tablet, Rfl: 6   Turmeric 500 MG TABS, Take 1,000 mg by mouth daily., Disp: , Rfl:    valsartan-hydrochlorothiazide (DIOVAN-HCT) 160-12.5 MG tablet, Take 1 tablet by mouth daily. , Disp: , Rfl:   Past Medical History: Past Medical History:  Diagnosis Date   Atrial flutter (HCC) 01/2008   RADIOFREQUENCY ABLATION [place]   Barrett esophagus    Bee sting allergy    Diverticulitis    DJD (degenerative joint disease) of knee    KNEES, BACK (LUMBAR FACET DISEASE) AND HIPS, DALLDORF.   GERD (gastroesophageal reflux disease)    Hiatal hernia  History of syncope    RECURRENT   Hyperlipidemia    BORDERLINE   Hypertension    Migraines    Obesity    Osteopenia    S/P RF ablation operation for arrhythmia    Severe aortic stenosis    Shoulder tendinitis, left 2008    Tobacco Use: Social History   Tobacco Use  Smoking Status Never Smoker  Smokeless Tobacco Never Used    Labs: Recent Review Flowsheet Data    Labs for ITP Cardiac and Pulmonary Rehab Latest Ref Rng & Units 07/27/2020 07/27/2020 07/27/2020 07/27/2020 07/27/2020   Hemoglobin A1c 4.8 - 5.6 % - - - - -   PHART 7.350 - 7.450 7.397 - 7.357 7.402 7.347(L)   PCO2ART 32.0  - 48.0 mmHg 47.5 - 43.2 41.3 43.9   HCO3 20.0 - 28.0 mmol/L 29.3(H) - 24.8 25.8 24.1   TCO2 22 - 32 mmol/L 31 27 26 27 25    ACIDBASEDEF 0.0 - 2.0 mmol/L - - 1.0 - 2.0   O2SAT % 100.0 - 98.0 99.0 97.0      Capillary Blood Glucose: Lab Results  Component Value Date   GLUCAP 125 (H) 07/29/2020   GLUCAP 120 (H) 07/28/2020   GLUCAP 123 (H) 07/28/2020   GLUCAP 131 (H) 07/28/2020   GLUCAP 129 (H) 07/28/2020     Exercise Target Goals: Exercise Program Goal: Individual exercise prescription set using results from initial 6 min walk test and THRR while considering  patients activity barriers and safety.   Exercise Prescription Goal: Starting with aerobic activity 30 plus minutes a day, 3 days per week for initial exercise prescription. Provide home exercise prescription and guidelines that participant acknowledges understanding prior to discharge.  Activity Barriers & Risk Stratification:  Activity Barriers & Cardiac Risk Stratification - 11/23/20 1559      Activity Barriers & Cardiac Risk Stratification   Activity Barriers Arthritis;Back Problems;Shortness of Breath;Right Hip Replacement;Joint Problems;Deconditioning;History of Falls;Balance Concerns    Cardiac Risk Stratification High           6 Minute Walk:  6 Minute Walk    Row Name 11/23/20 1541         6 Minute Walk   Phase Initial     Distance 1093 feet     Walk Time 6 minutes     # of Rest Breaks 0     MPH 2.07     METS 2.54     RPE 11     Perceived Dyspnea  2     VO2 Peak 8.89     Symptoms No     Resting HR 90 bpm     Resting BP 118/78     Resting Oxygen Saturation  99 %     Exercise Oxygen Saturation  during 6 min walk 98 %     Max Ex. HR 106 bpm     Max Ex. BP 132/78     2 Minute Post BP 116/80            Oxygen Initial Assessment:   Oxygen Re-Evaluation:   Oxygen Discharge (Final Oxygen Re-Evaluation):   Initial Exercise Prescription:  Initial Exercise Prescription - 11/23/20 1500       Date of Initial Exercise RX and Referring Provider   Date 11/23/20    Referring Provider 11/25/20, MD    Expected Discharge Date 01/19/21      NuStep   Level 2    SPM 80    Minutes 25  METs 1.5      Prescription Details   Frequency (times per week) 3    Duration Progress to 30 minutes of continuous aerobic without signs/symptoms of physical distress      Intensity   THRR 40-80% of Max Heartrate 62-123    Ratings of Perceived Exertion 11-13    Perceived Dyspnea 0-4      Progression   Progression Continue progressive overload as per policy without signs/symptoms or physical distress.      Resistance Training   Training Prescription Yes    Weight 2    Reps 10-15           Perform Capillary Blood Glucose checks as needed.  Exercise Prescription Changes:  Exercise Prescription Changes    Row Name 11/27/20 1627             Response to Exercise   Blood Pressure (Admit) 122/80       Blood Pressure (Exercise) 130/80       Blood Pressure (Exit) 120/72       Heart Rate (Admit) 98 bpm       Heart Rate (Exercise) 100 bpm       Heart Rate (Exit) 85 bpm       Rating of Perceived Exertion (Exercise) 13       Symptoms None       Comments Pt's first day of exercise in the CRP2 program       Duration Progress to 30 minutes of  aerobic without signs/symptoms of physical distress       Intensity THRR unchanged               Progression   Progression Continue to progress workloads to maintain intensity without signs/symptoms of physical distress.       Average METs 2.5               Resistance Training   Training Prescription Yes       Weight 2 lbs       Reps 10-15       Time 10 Minutes               Interval Training   Interval Training No               NuStep   Level 2       SPM 90       Minutes 30       METs 2.5              Exercise Comments:  Exercise Comments    Row Name 11/27/20 1630           Exercise Comments Pt's first day in the  CRP2 program. Pt tolerated the session well with no complaints and is off to a good start.              Exercise Goals and Review:  Exercise Goals    Row Name 11/23/20 1531             Exercise Goals   Increase Physical Activity Yes       Intervention Provide advice, education, support and counseling about physical activity/exercise needs.;Develop an individualized exercise prescription for aerobic and resistive training based on initial evaluation findings, risk stratification, comorbidities and participant's personal goals.       Expected Outcomes Short Term: Attend rehab on a regular basis to increase amount of physical activity.;Long Term: Add in home exercise to make exercise part of routine  and to increase amount of physical activity.;Long Term: Exercising regularly at least 3-5 days a week.       Increase Strength and Stamina Yes       Intervention Provide advice, education, support and counseling about physical activity/exercise needs.;Develop an individualized exercise prescription for aerobic and resistive training based on initial evaluation findings, risk stratification, comorbidities and participant's personal goals.       Expected Outcomes Short Term: Increase workloads from initial exercise prescription for resistance, speed, and METs.;Long Term: Improve cardiorespiratory fitness, muscular endurance and strength as measured by increased METs and functional capacity ( );Short Term: Perform resistance training exercises routinely during rehab and add in resistance training at home       Able to understand and use rate of perceived exertion (RPE) scale Yes       Intervention Provide education and explanation on how to use RPE scale       Expected Outcomes Short Term: Able to use RPE daily in rehab to express subjective intensity level;Long Term:  Able to use RPE to guide intensity level when exercising independently       Knowledge and understanding of Target Heart Rate Range  (THRR) Yes       Intervention Provide education and explanation of THRR including how the numbers were predicted and where they are located for reference       Expected Outcomes Short Term: Able to state/look up THRR;Long Term: Able to use THRR to govern intensity when exercising independently;Short Term: Able to use daily as guideline for intensity in rehab       Understanding of Exercise Prescription Yes       Intervention Provide education, explanation, and written materials on patient's individual exercise prescription       Expected Outcomes Short Term: Able to explain program exercise prescription;Long Term: Able to explain home exercise prescription to exercise independently              Exercise Goals Re-Evaluation :  Exercise Goals Re-Evaluation    Row Name 11/27/20 1630             Exercise Goal Re-Evaluation   Exercise Goals Review Increase Physical Activity;Increase Strength and Stamina;Able to understand and use rate of perceived exertion (RPE) scale;Knowledge and understanding of Target Heart Rate Range (THRR);Understanding of Exercise Prescription       Comments Pt's first day of exercise in the CRP2 program. Pt understnads the THRR, RPE scale, and Exercise Rx.       Expected Outcomes Will continue to monitor patients and progress exercise workloads as tolerated.               Discharge Exercise Prescription (Final Exercise Prescription Changes):  Exercise Prescription Changes - 11/27/20 1627      Response to Exercise   Blood Pressure (Admit) 122/80    Blood Pressure (Exercise) 130/80    Blood Pressure (Exit) 120/72    Heart Rate (Admit) 98 bpm    Heart Rate (Exercise) 100 bpm    Heart Rate (Exit) 85 bpm    Rating of Perceived Exertion (Exercise) 13    Symptoms None    Comments Pt's first day of exercise in the CRP2 program    Duration Progress to 30 minutes of  aerobic without signs/symptoms of physical distress    Intensity THRR unchanged      Progression    Progression Continue to progress workloads to maintain intensity without signs/symptoms of physical distress.    Average METs 2.5  Resistance Training   Training Prescription Yes    Weight 2 lbs    Reps 10-15    Time 10 Minutes      Interval Training   Interval Training No      NuStep   Level 2    SPM 90    Minutes 30    METs 2.5           Nutrition:  Target Goals: Understanding of nutrition guidelines, daily intake of sodium 1500mg , cholesterol 200mg , calories 30% from fat and 7% or less from saturated fats, daily to have 5 or more servings of fruits and vegetables.  Biometrics:  Pre Biometrics - 11/23/20 1415      Pre Biometrics   Waist Circumference 41.5 inches    Hip Circumference 46.5 inches    Waist to Hip Ratio 0.89 %    Triceps Skinfold 47 mm    % Body Fat 46.1 %    Grip Strength 29 kg    Flexibility 15.75 in    Single Leg Stand 1 seconds   High fall risk           Nutrition Therapy Plan and Nutrition Goals:  Nutrition Therapy & Goals - 12/01/20 1211      Nutrition Therapy   RD appointment deferred Yes           Nutrition Assessments:  MEDIFICTS Score Key:  ?70 Need to make dietary changes   40-70 Heart Healthy Diet  ? 40 Therapeutic Level Cholesterol Diet   Picture Your Plate Scores:  <96 Unhealthy dietary pattern with much room for improvement.  41-50 Dietary pattern unlikely to meet recommendations for good health and room for improvement.  51-60 More healthful dietary pattern, with some room for improvement.   >60 Healthy dietary pattern, although there may be some specific behaviors that could be improved.    Nutrition Goals Re-Evaluation:   Nutrition Goals Discharge (Final Nutrition Goals Re-Evaluation):   Psychosocial: Target Goals: Acknowledge presence or absence of significant depression and/or stress, maximize coping skills, provide positive support system. Participant is able to verbalize types and ability  to use techniques and skills needed for reducing stress and depression.  Initial Review & Psychosocial Screening:  Initial Psych Review & Screening - 11/23/20 1501      Initial Review   Current issues with Current Stress Concerns    Source of Stress Concerns Unable to participate in former interests or hobbies;Unable to perform yard/household activities;Occupation    Comments Ertha does not have the energy she thought she should have after surgery and a stressful job      Family Dynamics   Good Support System? Yes   Lezli lives with her daughter who she has for support     Barriers   Psychosocial barriers to participate in program The patient should benefit from training in stress management and relaxation.      Screening Interventions   Interventions Encouraged to exercise    Expected Outcomes Long Term Goal: Stressors or current issues are controlled or eliminated.           Quality of Life Scores:  Quality of Life - 11/23/20 1533      Quality of Life   Select Quality of Life      Quality of Life Scores   Health/Function Pre 24.64 %    Socioeconomic Pre 24.71 %    Psych/Spiritual Pre 22.71 %    Family Pre 25.5 %    GLOBAL Pre 24.34 %  Scores of 19 and below usually indicate a poorer quality of life in these areas.  A difference of  2-3 points is a clinically meaningful difference.  A difference of 2-3 points in the total score of the Quality of Life Index has been associated with significant improvement in overall quality of life, self-image, physical symptoms, and general health in studies assessing change in quality of life.  PHQ-9: Recent Review Flowsheet Data    Depression screen [facility] 2/9 11/23/2020   Decreased Interest 0   Down, Depressed, Hopeless 0   PHQ - 2 Score 0     Interpretation of Total Score  Total Score Depression Severity:  1-4 = Minimal depression, 5-9 = Mild depression, 10-14 = Moderate depression, 15-19 = Moderately severe depression,  20-27 = Severe depression   Psychosocial Evaluation and Intervention:   Psychosocial Re-Evaluation:  Psychosocial Re-Evaluation    Row Name 12/05/20 1811             Psychosocial Re-Evaluation   Current issues with Current Stress Concerns       Comments Lurlie has not voiced any increased concerns or stressors       Expected Outcomes Will continue to monitor and provide support as needed       Interventions Stress management education;Relaxation education       Continue Psychosocial Services  No Follow up required               Initial Review   Source of Stress Concerns Unable to perform yard/household activities;Unable to participate in former interests or hobbies              Psychosocial Discharge (Final Psychosocial Re-Evaluation):  Psychosocial Re-Evaluation - 12/05/20 1811      Psychosocial Re-Evaluation   Current issues with Current Stress Concerns    Comments Pablo has not voiced any increased concerns or stressors    Expected Outcomes Will continue to monitor and provide support as needed    Interventions Stress management education;Relaxation education    Continue Psychosocial Services  No Follow up required      Initial Review   Source of Stress Concerns Unable to perform yard/household activities;Unable to participate in former interests or hobbies           Vocational Rehabilitation: Provide vocational rehab assistance to qualifying candidates.   Vocational Rehab Evaluation & Intervention:  Vocational Rehab - 11/23/20 1603      Initial Vocational Rehab Evaluation & Intervention   Assessment shows need for Vocational Rehabilitation No   Lima works full time at [employer] and does not need vocational rehab at this time          Education: Education Goals: Education classes will be provided on a weekly basis, covering required topics. Participant will state understanding/return demonstration of topics presented.  Learning Barriers/Preferences:   Learning Barriers/Preferences - 11/23/20 1528      Learning Barriers/Preferences   Learning Barriers Sight   wears glasses   Learning Preferences Video;Skilled Demonstration;Pictoral;Individual Instruction;Group Instruction;Computer/Internet           Education Topics: Hypertension, Hypertension Reduction -Define heart disease and high blood pressure. Discus how high blood pressure affects the body and ways to reduce high blood pressure.   Exercise and Your Heart -Discuss why it is important to exercise, the FITT principles of exercise, normal and abnormal responses to exercise, and how to exercise safely.   Angina -Discuss definition of angina, causes of angina, treatment of angina, and how to decrease risk  of having angina.   Cardiac Medications -Review what the following cardiac medications are used for, how they affect the body, and side effects that may occur when taking the medications.  Medications include Aspirin, Beta blockers, calcium channel blockers, ACE Inhibitors, angiotensin receptor blockers, diuretics, digoxin, and antihyperlipidemics.   Congestive Heart Failure -Discuss the definition of CHF, how to live with CHF, the signs and symptoms of CHF, and how keep track of weight and sodium intake.   Heart Disease and Intimacy -Discus the effect sexual activity has on the heart, how changes occur during intimacy as we age, and safety during sexual activity.   Smoking Cessation / COPD -Discuss different methods to quit smoking, the health benefits of quitting smoking, and the definition of COPD.   Nutrition I: Fats -Discuss the types of cholesterol, what cholesterol does to the heart, and how cholesterol levels can be controlled.   Nutrition II: Labels -Discuss the different components of food labels and how to read food label   Heart Parts/Heart Disease and PAD -Discuss the anatomy of the heart, the pathway of blood circulation through the heart, and these  are affected by heart disease.   Stress I: Signs and Symptoms -Discuss the causes of stress, how stress may lead to anxiety and depression, and ways to limit stress.   Stress II: Relaxation -Discuss different types of relaxation techniques to limit stress.   Warning Signs of Stroke / TIA -Discuss definition of a stroke, what the signs and symptoms are of a stroke, and how to identify when someone is having stroke.   Knowledge Questionnaire Score:  Knowledge Questionnaire Score - 11/23/20 1533      Knowledge Questionnaire Score   Pre Score 20/24           Core Components/Risk Factors/Patient Goals at Admission:  Personal Goals and Risk Factors at Admission - 11/23/20 1529      Core Components/Risk Factors/Patient Goals on Admission    Weight Management Yes;Obesity;Weight Loss    Intervention Weight Management: Develop a combined nutrition and exercise program designed to reach desired caloric intake, while maintaining appropriate intake of nutrient and fiber, sodium and fats, and appropriate energy expenditure required for the weight goal.;Weight Management: Provide education and appropriate resources to help participant work on and attain dietary goals.;Weight Management/Obesity: Establish reasonable short term and long term weight goals.;Obesity: Provide education and appropriate resources to help participant work on and attain dietary goals.    Admit Weight 194 lb 7.1 oz (88.2 kg)    Expected Outcomes Short Term: Continue to assess and modify interventions until short term weight is achieved;Long Term: Adherence to nutrition and physical activity/exercise program aimed toward attainment of established weight goal;Weight Maintenance: Understanding of the daily nutrition guidelines, which includes 25-35% calories from fat, 7% or less cal from saturated fats, less than 200mg  cholesterol, less than 1.5gm of sodium, & 5 or more servings of fruits and vegetables daily;Weight Loss:  Understanding of general recommendations for a balanced deficit meal plan, which promotes 1-2 lb weight loss per week and includes a negative energy balance of [phone] kcal/d;Understanding recommendations for meals to include 15-35% energy as protein, 25-35% energy from fat, 35-60% energy from carbohydrates, less than 200mg  of dietary cholesterol, 20-35 gm of total fiber daily;Understanding of distribution of calorie intake throughout the day with the consumption of 4-5 meals/snacks    Improve shortness of breath with ADL's Yes    Expected Outcomes Short Term: Improve cardiorespiratory fitness to achieve a reduction of symptoms  when performing ADLs;Long Term: Be able to perform more ADLs without symptoms or delay the onset of symptoms    Hypertension Yes    Intervention Provide education on lifestyle modifcations including regular physical activity/exercise, weight management, moderate sodium restriction and increased consumption of fresh fruit, vegetables, and low fat dairy, alcohol moderation, and smoking cessation.;Monitor prescription use compliance.    Expected Outcomes Short Term: Continued assessment and intervention until BP is < 140/47mm HG in hypertensive participants. < 130/47mm HG in hypertensive participants with diabetes, heart failure or chronic kidney disease.;Long Term: Maintenance of blood pressure at goal levels.    Lipids Yes    Intervention Provide education and support for participant on nutrition & aerobic/resistive exercise along with prescribed medications to achieve LDL 70mg , HDL >40mg .    Expected Outcomes Short Term: Participant states understanding of desired cholesterol values and is compliant with medications prescribed. Participant is following exercise prescription and nutrition guidelines.;Long Term: Cholesterol controlled with medications as prescribed, with individualized exercise RX and with personalized nutrition plan. Value goals: LDL < , HDL > 40 mg.    Stress  Yes    Intervention Offer individual and/or small group education and counseling on adjustment to heart disease, stress management and health-related lifestyle change. Teach and support self-help strategies.    Expected Outcomes Short Term: Participant demonstrates changes in health-related behavior, relaxation and other stress management skills, ability to obtain effective social support, and compliance with psychotropic medications if prescribed.;Long Term: Emotional wellbeing is indicated by absence of clinically significant psychosocial distress or social isolation.           Core Components/Risk Factors/Patient Goals Review:   Goals and Risk Factor Review    Row Name 11/28/20 1212 12/05/20 1813           Core Components/Risk Factors/Patient Goals Review   Personal Goals Review Weight Management/Obesity;Diabetes;Lipids;Improve shortness of breath with ADL's;Hypertension Weight Management/Obesity;Diabetes;Lipids;Improve shortness of breath with ADL's;Hypertension      Review Marvene started exercise on 11/27/20. Ziara did well with exercise. Vital signs were stable Fallyn is off to a good start to exercise. Vital signs have been stable. Doris reports feeling a little less short of breath      Expected Outcomes Bonny will continue to participate in phase 2 cardiac rehab for exercise, nutrition and lifestyle modifications Damani will continue to participate in phase 2 cardiac rehab for exercise, nutrition and lifestyle modifications             Core Components/Risk Factors/Patient Goals at Discharge (Final Review):   Goals and Risk Factor Review - 12/05/20 1813      Core Components/Risk Factors/Patient Goals Review   Personal Goals Review Weight Management/Obesity;Diabetes;Lipids;Improve shortness of breath with ADL's;Hypertension    Review Nylene is off to a good start to exercise. Vital signs have been stable. Obera reports feeling a little less short of breath    Expected Outcomes Maiah  will continue to participate in phase 2 cardiac rehab for exercise, nutrition and lifestyle modifications           ITP Comments:  ITP Comments    Row Name 11/23/20 1500 12/05/20 1810         ITP Comments Dr Armanda Magic MD, Medical Director 30 day ITP Review. Margaretha is off to a good start to exercise             Comments: See ITP comments.Gladstone Lighter, RN,BSN 12/05/2020 6:15 PM

## 2020-12-06 ENCOUNTER — Encounter (HOSPITAL_COMMUNITY)
Admission: RE | Admit: 2020-12-06 | Discharge: 2020-12-06 | Disposition: A | Discharge status: Home / Self Care | Payer: Medicare Other | Source: Ambulatory Visit | Attending: Interventional Cardiology | Admitting: Interventional Cardiology

## 2020-12-06 ENCOUNTER — Other Ambulatory Visit: Payer: Self-pay

## 2020-12-06 DIAGNOSIS — Z952 Presence of prosthetic heart valve: Secondary | ICD-10-CM

## 2020-12-06 DIAGNOSIS — Z5189 Encounter for other specified aftercare: Secondary | ICD-10-CM | POA: Diagnosis present

## 2020-12-08 ENCOUNTER — Encounter (HOSPITAL_COMMUNITY)
Admission: RE | Admit: 2020-12-08 | Discharge: 2020-12-08 | Disposition: A | Discharge status: Home / Self Care | Payer: Medicare Other | Source: Ambulatory Visit | Attending: Interventional Cardiology | Admitting: Interventional Cardiology

## 2020-12-08 ENCOUNTER — Other Ambulatory Visit: Payer: Self-pay

## 2020-12-08 VITALS — Wt 194.0 lb

## 2020-12-08 DIAGNOSIS — Z952 Presence of prosthetic heart valve: Secondary | ICD-10-CM

## 2020-12-08 DIAGNOSIS — Z5189 Encounter for other specified aftercare: Secondary | ICD-10-CM | POA: Diagnosis not present

## 2020-12-10 NOTE — Progress Notes (Deleted)
Cardiology Office Note   Date:  12/10/2020   ID:  Rebecca Farley, DOB 01-05-1956, MRN [id number]  PCP:  Kirby Funk, MD    No chief complaint on file.  S/p AVR  Wt Readings from Last 3 Encounters:  11/23/20 194 lb 7.1 oz (88.2 kg)  09/05/20 189 lb (85.7 kg)  08/23/20 191 lb 3.2 oz (86.7 kg)       History of Present Illness: Rebecca Farley is a 65 y.o. female   With a h/o atrial flutter and AS.Works at [employer].  She had a h/o atrial flutter in the past and had an ablation in [place] many years ago.  She had a heart murmur on her routine exam and was sent for an echoin 2020. She was not having any new symptoms. She does report some DOE for 15 years. She has had soe dizzy spells for 15 years as well. It can happen even when she was sitting down. She wore a monitor many years ago and this was ewhen the AFlutter was diagnosed. HR was 300 bpm per her report.  She had severe AS treated with SAVR in 2021 (Aortic valve replacement using a 23 mm [employer] INSPIRIS RESILIAvalve.)   Seen by EP post op in 10/21: "The patient has developed accelerated junctional rhythm with likely a firing automatic focus near the AV node.  I do not believe that she has advanced conduction system disease or risks of AV block, though she does have occasional VA dissociation when ventricular rates exceed atrial rates. I feel that this rhythm will likely resolve on its own with time. I do not feel that pacing or additional inpatient EP workup is advised. Hopefully, we can eventually wean her off of digoxin/ amiodarone.  "    Past Medical History:  Diagnosis Date  . Atrial flutter (HCC) 01/2008   RADIOFREQUENCY ABLATION [facility] [place]  . Barrett esophagus   . Bee sting allergy   . Diverticulitis   . DJD (degenerative joint disease) of knee    KNEES, BACK (LUMBAR FACET DISEASE) AND HIPS, DALLDORF.  Marland Kitchen GERD (gastroesophageal reflux disease)   . Hiatal hernia   . History of  syncope    RECURRENT  . Hyperlipidemia    BORDERLINE  . Hypertension   . Migraines   . Obesity   . Osteopenia   . S/P RF ablation operation for arrhythmia   . Severe aortic stenosis   . Shoulder tendinitis, left 2008    Past Surgical History:  Procedure Laterality Date  . ABDOMINAL HYSTERECTOMY    . AORTIC VALVE REPLACEMENT N/A 07/27/2020   Procedure: AORTIC VALVE REPLACEMENT (AVR) USING 23 MM INSPIRIS RESILIA  AORTIC VALVE;  Surgeon: Alleen Borne, MD;  Location: [facility] OR;  Service: Open Heart Surgery;  Laterality: N/A;  . BREAST BIOPSY Left    10 yrs +  . CARDIAC CATHETERIZATION    . CESAREAN SECTION     1979, 1984  . CHOLECYSTECTOMY    . COLONOSCOPY  11/03/2012   Procedure: COLONOSCOPY;  Surgeon: Charolett Bumpers, MD;  Location: [facility] ENDOSCOPY;  Service: Endoscopy;  Laterality: N/A;  . ESOPHAGOGASTRODUODENOSCOPY  11/03/2012   Procedure: ESOPHAGOGASTRODUODENOSCOPY (EGD);  Surgeon: Charolett Bumpers, MD;  Location: [facility];  Service: Endoscopy;  Laterality: N/A;  . implanted loop device    . RIGHT HEART CATH AND CORONARY ANGIOGRAPHY N/A 07/07/2020   Procedure: RIGHT HEART CATH AND CORONARY ANGIOGRAPHY;  Surgeon: Tonny Bollman, MD;  Location: [facility]  INVASIVE CV LAB;  Service: Cardiovascular;  Laterality: N/A;  . TEE WITHOUT CARDIOVERSION N/A 07/27/2020   Procedure: TRANSESOPHAGEAL ECHOCARDIOGRAM (TEE);  Surgeon: Alleen Borne, MD;  Location: [facility] OR;  Service: Open Heart Surgery;  Laterality: N/A;  . TOTAL HIP ARTHROPLASTY Right 11/23/2019   Procedure: RIGHT TOTAL HIP ARTHROPLASTY ANTERIOR APPROACH;  Surgeon: Marcene Corning, MD;  Location: [facility] ORS;  Service: Orthopedics;  Laterality: Right;     Current Outpatient Medications  Medication Sig Dispense Refill  . acetaminophen (TYLENOL) 500 MG tablet Take 500-1,000 mg by mouth every 6 (six) hours as needed (for pain.).    Marland Kitchen Ascorbic Acid (VITAMIN C WITH ROSE HIPS) 500 MG tablet Take 500 mg by mouth daily.    Marland Kitchen aspirin EC 325 MG EC  tablet Take 1 tablet (325 mg total) by mouth daily. 30 tablet 0  . b complex vitamins tablet Take 1 tablet by mouth daily.    . Calcium Carb-Cholecalciferol (CALCIUM+D3 PO) Take 1 tablet by mouth daily.    Marland Kitchen doxycycline (VIBRAMYCIN) 50 MG capsule Take 2 capsules 1 hour prior to any dental procedures (Patient not taking: Reported on 11/04/2020) 2 capsule 3  . EPINEPHrine 0.3 mg/0.3 mL IJ SOAJ injection Inject 0.3 mg into the muscle as needed for anaphylaxis.    . folic acid (FOLVITE) 1 MG tablet Take 1 mg by mouth daily.    . Galcanezumab-gnlm (EMGALITY) 120 MG/ML SOAJ Inject 120 mg into the skin every 30 (thirty) days. 1.12 mL 11  . Ginkgo Biloba 120 MG TABS Take 120 mg by mouth daily.    . Glucosamine-Chondroitin (COSAMIN DS PO) Take 1-2 tablets by mouth See admin instructions. Take 2 tablets by mouth in the morning & take 1 tablet by mouth at night.    . metoprolol tartrate (LOPRESSOR) 25 MG tablet Take 1 tablet (25 mg total) by mouth 2 (two) times daily. 180 tablet 3  . Multiple Vitamin (MULTIVITAMIN WITH MINERALS) TABS tablet Take 1 tablet by mouth daily.    . Multiple Vitamins-Minerals (EMERGEN-C IMMUNE PLUS) PACK Take 1 packet by mouth daily.    . Omega-3 1000 MG CAPS Take 2,000 mg by mouth daily.    Marland Kitchen omeprazole (PRILOSEC) 20 MG capsule Take 20 mg by mouth daily before breakfast.    . Polyethyl Glycol-Propyl Glycol (LUBRICANT EYE DROPS) 0.4-0.3 % SOLN Place 1 drop into both eyes 3 (three) times daily as needed (dry/irritated eyes.).    Marland Kitchen Rimegepant Sulfate (NURTEC) 75 MG TBDP Take 75 mg by mouth daily as needed. For migraines. Take as close to onset of migraine as possible. One daily maximum. 8 tablet 6  . Turmeric 500 MG TABS Take 1,000 mg by mouth daily.    . valsartan-hydrochlorothiazide (DIOVAN-HCT) 160-12.5 MG tablet Take 1 tablet by mouth daily.      No current facility-administered medications for this visit.    Allergies:   Mobic [meloxicam], Penicillins, Codeine, and Nsaids     Social History:  The patient  reports that she has never smoked. She has never used smokeless tobacco. She reports current alcohol use. She reports that she does not use drugs.   Family History:  The patient's ***family history includes Aneurysm in her mother; Colon cancer (age of onset: 32) in her sister; Colon cancer (age of onset: 74) in her brother; Emphysema in her father; Heart failure in her mother; Hypertension in her mother; Idiopathic pulmonary fibrosis in her brother; Kidney failure in her mother; Liver cancer in her maternal uncle; Lung  cancer in her father, maternal uncle, and paternal uncle; Melanoma in her sister; Melanoma (age of onset: 49) in her sister; Other in her sister, sister, sister, and sister; Ovarian cancer in her paternal grandmother; Prostate cancer in her father.    ROS:  Please see the history of present illness.   Otherwise, review of systems are positive for ***.   All other systems are reviewed and negative.    PHYSICAL EXAM: VS:  There were no vitals taken for this visit. , BMI There is no height or weight on file to calculate BMI. GEN: Well nourished, well developed, in no acute distress  HEENT: normal  Neck: no JVD, carotid bruits, or masses Cardiac: ***RRR; no murmurs, rubs, or gallops,no edema  Respiratory:  clear to auscultation bilaterally, normal work of breathing GI: soft, nontender, nondistended, + BS MS: no deformity or atrophy  Skin: warm and dry, no rash Neuro:  Strength and sensation are intact Psych: euthymic mood, full affect   EKG:   The ekg ordered today demonstrates ***   Recent Labs: 06/15/2020: TSH 2.660 07/25/2020: ALT 16 07/28/2020: Magnesium 2.0 07/31/2020: Hemoglobin 9.5; Platelets 175 08/01/2020: BUN 15; Creatinine, Ser 0.80; Potassium 4.0; Sodium 137   Lipid Panel No results found for: CHOL, TRIG, HDL, CHOLHDL, VLDL, LDLCALC, LDLDIRECT   Other studies Reviewed: Additional studies/ records that were reviewed today  with results demonstrating: ***.   ASSESSMENT AND PLAN:  1. Aortic stenosis: s/p AVR.  2. AFlutter: 3. ***   Current medicines are reviewed at length with the patient today.  The patient concerns regarding her medicines were addressed.  The following changes have been made:  No change***  Labs/ tests ordered today include: *** No orders of the defined types were placed in this encounter.   Recommend 150 minutes/week of aerobic exercise Low fat, low carb, high fiber diet recommended  Disposition:   FU in ***   Signed, Lance Muss, MD  12/10/2020 9:29 PM    [facility] [address] [place], [place], [place]  [address] Phone: [phone]; Fax: [phone]

## 2020-12-11 ENCOUNTER — Telehealth (HOSPITAL_COMMUNITY): Payer: Self-pay | Admitting: *Deleted

## 2020-12-11 ENCOUNTER — Encounter (HOSPITAL_COMMUNITY): Payer: Medicare Other

## 2020-12-11 NOTE — Progress Notes (Signed)
Rebecca Farley 65 y.o. female Nutrition Note  Diagnosis: AVR  Past Medical History:  Diagnosis Date  . Atrial flutter (HCC) 01/2008   [place]IO[place]QUENCY [place]BL[place]  . Barrett esophagus   . Bee sting allergy   . Diverticulitis   . DJD (degenerative joint disease) of knee    KNEES, BACK (LUMBAR FACET DISEASE) AND HIPS, DALLDORF.  Marland Kitchen GERD (gastroesophageal reflux disease)   . Hiatal hernia   . History of syncope    RECURRENT  . Hyperlipidemia    BORDERLINE  . Hypertension   . Migraines   . Obesity   . Osteopenia   . S/P RF ablation operation for arrhythmia   . Severe aortic stenosis   . Shoulder tendinitis, left 2008     Medications reviewed.   Current Outpatient Medications:  .  acetaminophen (TYLENOL) 500 MG tablet, Take 500-1,000 mg by mouth every 6 (six) hours as needed (for pain.)., Disp: , Rfl:  .  Ascorbic Acid (VITAMIN C WITH ROSE HIPS) 500 MG tablet, Take 500 mg by mouth daily., Disp: , Rfl:  .  aspirin EC 325 MG EC tablet, Take 1 tablet (325 mg total) by mouth daily., Disp: 30 tablet, Rfl: 0 .  b complex vitamins tablet, Take 1 tablet by mouth daily., Disp: , Rfl:  .  Calcium Carb-Cholecalciferol (CALCIUM+D3 PO), Take 1 tablet by mouth daily., Disp: , Rfl:  .  doxycycline (VIBRAMYCIN) 50 MG capsule, Take 2 capsules 1 hour prior to any dental procedures (Patient not taking: Reported on 11/04/2020), Disp: 2 capsule, Rfl: 3 .  EPINEPHrine 0.3 mg/0.3 mL IJ SOAJ injection, Inject 0.3 mg into the muscle as needed for anaphylaxis., Disp: , Rfl:  .  folic acid (FOLVITE) 1 MG tablet, Take 1 mg by mouth daily., Disp: , Rfl:  .  Galcanezumab-gnlm (EMGALITY) 120 MG/ML SOAJ, Inject 120 mg into the skin every 30 (thirty) days., Disp: 1.12 mL, Rfl: 11 .  Ginkgo Biloba 120 MG TABS, Take 120 mg by mouth daily., Disp: , Rfl:  .  Glucosamine-Chondroitin (COSAMIN DS PO), Take 1-2 tablets by mouth See admin instructions. Take 2 tablets by mouth in the morning & take 1 tablet by  mouth at night., Disp: , Rfl:  .  metoprolol tartrate (LOPRESSOR) 25 MG tablet, Take 1 tablet (25 mg total) by mouth 2 (two) times daily., Disp: 180 tablet, Rfl: 3 .  Multiple Vitamin (MULTIVITAMIN WITH MINERALS) TABS tablet, Take 1 tablet by mouth daily., Disp: , Rfl:  .  Multiple Vitamins-Minerals (EMERGEN-C IMMUNE PLUS) PACK, Take 1 packet by mouth daily., Disp: , Rfl:  .  Omega-3 1000 MG CAPS, Take 2,000 mg by mouth daily., Disp: , Rfl:  .  omeprazole (PRILOSEC) 20 MG capsule, Take 20 mg by mouth daily before breakfast., Disp: , Rfl:  .  Polyethyl Glycol-Propyl Glycol (LUBRICANT EYE DROPS) 0.4-0.3 % SOLN, Place 1 drop into both eyes 3 (three) times daily as needed (dry/irritated eyes.)., Disp: , Rfl:  .  Rimegepant Sulfate (NURTEC) 75 MG TBDP, Take 75 mg by mouth daily as needed. For migraines. Take as close to onset of migraine as possible. One daily maximum., Disp: 8 tablet, Rfl: 6 .  Turmeric 500 MG TABS, Take 1,000 mg by mouth daily., Disp: , Rfl:  .  valsartan-hydrochlorothiazide (DIOVAN-HCT) 160-12.5 MG tablet, Take 1 tablet by mouth daily. , Disp: , Rfl:    Ht Readings from Last 1 Encounters:  11/23/20 5' 5.25" (1.657 m)     Wt Readings  from Last 3 Encounters:  11/23/20 194 lb 7.1 oz (88.2 kg)  09/05/20 189 lb (85.7 kg)  08/23/20 191 lb 3.2 oz (86.7 kg)     There is no height or weight on file to calculate BMI.   Social History   Tobacco Use  Smoking Status Never Smoker  Smokeless Tobacco Never Used     No results found for: CHOL No results found for: HDL No results found for: LDLCALC No results found for: TRIG   Lab Results  Component Value Date   HGBA1C 5.4 07/25/2020     CBG (last 3)  No results for input(s): GLUCAP in the last 72 hours.   Nutrition Note  Spoke with pt. Nutrition Plan and Nutrition Survey goals reviewed with pt. Pt has followed a heart healthy diet in the past but is not right now. Pt reports high stress and increased eating out.  Pt  wants to lose wt. Pt has lost weight in the past by eating healthy with her daughter. Reviewed weight and diet history. She does not think she has ever gone on a diet. Diet recall: Breakfast: Milk, whole wheat english muffin with peanut butter Snack: fruit Lunch: Leftovers -  Pizza OR Chili OR PB&J OR Tuna sandwich OR tokyo grill Dinner: Fast food -- 1 can pepsi, mcdonalds or [employer]FC or Pizza Snack: ice cream  We reviewed sodium in foods. Per discussion, pt does use canned/convenience foods often. Pt does not add salt to food. Pt does eat out frequently.  She knows how to read labels but has not been doing it recently.  She does want to make diet changes.  Pt expressed understanding of the information reviewed.    Nutrition Diagnosis ? Obese  I = 30-34.9 related to excessive energy intake as evidenced by a BMI 32.04 kg/m2  Nutrition Intervention ? Pt's individual nutrition plan reviewed with pt. ? Meal Planning and healthier dining out    ? Continue client-centered nutrition education by RD, as part of interdisciplinary care.  Goal(s) ? Pt to choose healthier restaurants ? Pt to identify food quantities necessary to achieve weight loss of 6-24 lb at graduation from cardiac rehab.  ? Pt to build a healthy plate including vegetables, fruits, whole grains, and low-fat dairy products in a heart healthy meal plan.  Plan:   Will provide client-centered nutrition education as part of interdisciplinary care  Monitor and evaluate progress toward nutrition goal with team.   Andrey Campanile, MS, RDN, LDN

## 2020-12-12 ENCOUNTER — Ambulatory Visit: Payer: Medicare Other | Admitting: Interventional Cardiology

## 2020-12-12 NOTE — Progress Notes (Signed)
Reviewed home exercise Rx with patient today. Pt is walking at home and at work. Encouraged 2-3 x/week 30-45 minutes in addition to the CRP2 program. Encouraged warm-up, cool-down and stretching. Reviewed THRR of 62-123 and keeping RPE between 11-13. Hydration encouraged. Reviewed weather parameters for temperature and humidity for safe exercise outdoors. Reviewed S/S to terminate exercise and when to call MD vs 911. Pt verbalized understanding of the home exercise Rx and was provided a copy.  Lorin Picket MS, ACSM-EP-C, CCRP

## 2020-12-13 ENCOUNTER — Encounter (HOSPITAL_COMMUNITY): Payer: Medicare Other

## 2020-12-13 ENCOUNTER — Telehealth (HOSPITAL_COMMUNITY): Payer: Self-pay | Admitting: *Deleted

## 2020-12-15 ENCOUNTER — Telehealth (HOSPITAL_COMMUNITY): Payer: Self-pay | Admitting: *Deleted

## 2020-12-15 ENCOUNTER — Encounter (HOSPITAL_COMMUNITY): Payer: Medicare Other

## 2020-12-18 ENCOUNTER — Encounter (HOSPITAL_COMMUNITY)
Admission: RE | Admit: 2020-12-18 | Discharge: 2020-12-18 | Disposition: A | Discharge status: Home / Self Care | Payer: Medicare Other | Source: Ambulatory Visit | Attending: Interventional Cardiology | Admitting: Interventional Cardiology

## 2020-12-18 ENCOUNTER — Other Ambulatory Visit: Payer: Self-pay

## 2020-12-18 ENCOUNTER — Telehealth (HOSPITAL_COMMUNITY): Payer: Self-pay | Admitting: *Deleted

## 2020-12-18 DIAGNOSIS — Z952 Presence of prosthetic heart valve: Secondary | ICD-10-CM

## 2020-12-18 DIAGNOSIS — Z5189 Encounter for other specified aftercare: Secondary | ICD-10-CM | POA: Diagnosis not present

## 2020-12-18 NOTE — Telephone Encounter (Signed)
Spoke with Susen she had the migraine headache and felt bad last week. Wendie was out from work as well. Turner reports feeling better and plans to return to exercise at cardiac rehab this afternoon.Gladstone Lighter, RN,BSN 12/18/2020 9:41 AM

## 2020-12-20 ENCOUNTER — Encounter (HOSPITAL_COMMUNITY)
Admission: RE | Admit: 2020-12-20 | Discharge: 2020-12-20 | Disposition: A | Discharge status: Home / Self Care | Payer: Medicare Other | Source: Ambulatory Visit | Attending: Interventional Cardiology | Admitting: Interventional Cardiology

## 2020-12-20 ENCOUNTER — Other Ambulatory Visit: Payer: Self-pay

## 2020-12-20 DIAGNOSIS — Z5189 Encounter for other specified aftercare: Secondary | ICD-10-CM | POA: Diagnosis not present

## 2020-12-20 DIAGNOSIS — Z952 Presence of prosthetic heart valve: Secondary | ICD-10-CM

## 2020-12-21 NOTE — Telephone Encounter (Signed)
I spoke with patient and she will come in tomorrow afternoon for lab work

## 2020-12-22 ENCOUNTER — Other Ambulatory Visit: Payer: Self-pay

## 2020-12-22 ENCOUNTER — Encounter (HOSPITAL_COMMUNITY)
Admission: RE | Admit: 2020-12-22 | Discharge: 2020-12-22 | Disposition: A | Discharge status: Home / Self Care | Payer: Medicare Other | Source: Ambulatory Visit | Attending: Interventional Cardiology | Admitting: Interventional Cardiology

## 2020-12-22 DIAGNOSIS — Z5189 Encounter for other specified aftercare: Secondary | ICD-10-CM | POA: Diagnosis not present

## 2020-12-22 DIAGNOSIS — Z952 Presence of prosthetic heart valve: Secondary | ICD-10-CM

## 2020-12-25 ENCOUNTER — Encounter (HOSPITAL_COMMUNITY)
Admission: RE | Admit: 2020-12-25 | Discharge: 2020-12-25 | Disposition: A | Discharge status: Home / Self Care | Payer: Medicare Other | Source: Ambulatory Visit | Attending: Interventional Cardiology | Admitting: Interventional Cardiology

## 2020-12-25 ENCOUNTER — Other Ambulatory Visit: Payer: Self-pay

## 2020-12-25 ENCOUNTER — Other Ambulatory Visit: Payer: Medicare Other | Admitting: *Deleted

## 2020-12-25 DIAGNOSIS — R0602 Shortness of breath: Secondary | ICD-10-CM

## 2020-12-25 DIAGNOSIS — Z5189 Encounter for other specified aftercare: Secondary | ICD-10-CM | POA: Diagnosis not present

## 2020-12-25 DIAGNOSIS — R5383 Other fatigue: Secondary | ICD-10-CM

## 2020-12-25 DIAGNOSIS — Z952 Presence of prosthetic heart valve: Secondary | ICD-10-CM

## 2020-12-26 ENCOUNTER — Telehealth: Payer: Self-pay | Admitting: *Deleted

## 2020-12-26 LAB — CBC
Hematocrit: 36.8 % (ref 34.0–46.6)
Hemoglobin: 12.6 g/dL (ref 11.1–15.9)
MCH: 30.4 pg (ref 26.6–33.0)
MCHC: 34.2 g/dL (ref 31.5–35.7)
MCV: 89 fL (ref 79–97)
Platelets: 242 10*3/uL (ref 150–450)
RBC: 4.15 x10E6/uL (ref 3.77–5.28)
RDW: 14.4 % (ref 11.7–15.4)
WBC: 8.6 10*3/uL (ref 3.4–10.8)

## 2020-12-26 LAB — COMPREHENSIVE METABOLIC PANEL
ALT: 17 IU/L (ref 0–32)
AST: 24 IU/L (ref 0–40)
Albumin/Globulin Ratio: 1.4 (ref 1.2–2.2)
Albumin: 4.3 g/dL (ref 3.8–4.8)
Alkaline Phosphatase: 112 IU/L (ref 44–121)
BUN/Creatinine Ratio: 18 (ref 12–28)
BUN: 16 mg/dL (ref 8–27)
Bilirubin Total: <0.2 mg/dL (ref 0.0–1.2)
CO2: 23 mmol/L (ref 20–29)
Calcium: 9.1 mg/dL (ref 8.7–10.3)
Chloride: 96 mmol/L (ref 96–106)
Creatinine, Ser: 0.9 mg/dL (ref 0.57–1.00)
Globulin, Total: 3.1 g/dL (ref 1.5–4.5)
Glucose: 90 mg/dL (ref 65–99)
Potassium: 3.3 mmol/L — ABNORMAL LOW (ref 3.5–5.2)
Sodium: 136 mmol/L (ref 134–144)
Total Protein: 7.4 g/dL (ref 6.0–8.5)
eGFR: 71 mL/min/{1.73_m2} (ref >59)

## 2020-12-26 LAB — TSH: TSH: 2.89 u[IU]/mL (ref 0.450–4.500)

## 2020-12-26 LAB — PRO B NATRIURETIC PEPTIDE: NT-Pro BNP: 83 pg/mL (ref 0–301)

## 2020-12-26 MED ORDER — VALSARTAN 160 MG PO TABS: 160.0000 mg | ORAL_TABLET | Freq: Every day | ORAL | 3 refills | Status: DC

## 2020-12-26 NOTE — Telephone Encounter (Signed)
Patient notified.  Prescription sent to [facility] at [facility].

## 2020-12-26 NOTE — Telephone Encounter (Signed)
-----   Message from Corky Crafts, MD sent at 12/26/2020  3:44 PM EDT ----- Low potassium.  Normal TSH, liver and kidney function.  Normal BNP- no excess fluid in her system. Change valsartan /HCTZ to valsartan 160 mg daily.  Repeat BMet when she sees me on 3/31.

## 2020-12-27 ENCOUNTER — Encounter (HOSPITAL_COMMUNITY)
Admission: RE | Admit: 2020-12-27 | Discharge: 2020-12-27 | Disposition: A | Discharge status: Home / Self Care | Payer: Medicare Other | Source: Ambulatory Visit | Attending: Interventional Cardiology | Admitting: Interventional Cardiology

## 2020-12-27 ENCOUNTER — Other Ambulatory Visit: Payer: Self-pay

## 2020-12-27 DIAGNOSIS — Z5189 Encounter for other specified aftercare: Secondary | ICD-10-CM | POA: Diagnosis not present

## 2020-12-27 DIAGNOSIS — Z952 Presence of prosthetic heart valve: Secondary | ICD-10-CM

## 2020-12-29 ENCOUNTER — Other Ambulatory Visit: Payer: Self-pay

## 2020-12-29 ENCOUNTER — Encounter (HOSPITAL_COMMUNITY)
Admission: RE | Admit: 2020-12-29 | Discharge: 2020-12-29 | Disposition: A | Discharge status: Home / Self Care | Payer: Medicare Other | Source: Ambulatory Visit | Attending: Interventional Cardiology | Admitting: Interventional Cardiology

## 2020-12-29 VITALS — Wt 198.6 lb

## 2020-12-29 DIAGNOSIS — Z952 Presence of prosthetic heart valve: Secondary | ICD-10-CM

## 2020-12-29 DIAGNOSIS — Z5189 Encounter for other specified aftercare: Secondary | ICD-10-CM | POA: Diagnosis not present

## 2021-01-01 ENCOUNTER — Other Ambulatory Visit: Payer: Self-pay

## 2021-01-01 ENCOUNTER — Encounter (HOSPITAL_COMMUNITY)
Admission: RE | Admit: 2021-01-01 | Discharge: 2021-01-01 | Disposition: A | Discharge status: Home / Self Care | Payer: Medicare Other | Source: Ambulatory Visit | Attending: Interventional Cardiology | Admitting: Interventional Cardiology

## 2021-01-01 DIAGNOSIS — Z5189 Encounter for other specified aftercare: Secondary | ICD-10-CM | POA: Diagnosis not present

## 2021-01-01 DIAGNOSIS — Z952 Presence of prosthetic heart valve: Secondary | ICD-10-CM

## 2021-01-03 ENCOUNTER — Other Ambulatory Visit: Payer: Self-pay

## 2021-01-03 ENCOUNTER — Encounter (HOSPITAL_COMMUNITY)
Admission: RE | Admit: 2021-01-03 | Discharge: 2021-01-03 | Disposition: A | Discharge status: Home / Self Care | Payer: Medicare Other | Source: Ambulatory Visit | Attending: Interventional Cardiology | Admitting: Interventional Cardiology

## 2021-01-03 DIAGNOSIS — Z5189 Encounter for other specified aftercare: Secondary | ICD-10-CM | POA: Diagnosis not present

## 2021-01-03 DIAGNOSIS — Z952 Presence of prosthetic heart valve: Secondary | ICD-10-CM

## 2021-01-03 NOTE — Progress Notes (Signed)
Cardiac Individual Treatment Plan  Patient Details  Name: Rebecca Farley MRN: [id number] Date of Birth: 08/28/1956 Referring Provider:   Flowsheet Row CARDIAC REHAB PHASE II ORIENTATION from 11/23/2020 in [facility] CARDIAC REHAB  Referring Provider Lance Muss, MD      Initial Encounter Date:  Flowsheet Row CARDIAC REHAB PHASE II ORIENTATION from 11/23/2020 in [facility] CARDIAC REHAB  Date 11/23/20      Visit Diagnosis: 07/27/20 S/P AVR  Patient's Home Medications on Admission:  Current Outpatient Medications:  .  acetaminophen (TYLENOL) 500 MG tablet, Take 500-1,000 mg by mouth every 6 (six) hours as needed (for pain.)., Disp: , Rfl:  .  Ascorbic Acid (VITAMIN C WITH ROSE HIPS) 500 MG tablet, Take 500 mg by mouth daily., Disp: , Rfl:  .  aspirin EC 325 MG EC tablet, Take 1 tablet (325 mg total) by mouth daily., Disp: 30 tablet, Rfl: 0 .  b complex vitamins tablet, Take 1 tablet by mouth daily., Disp: , Rfl:  .  Calcium Carb-Cholecalciferol (CALCIUM+D3 PO), Take 1 tablet by mouth daily., Disp: , Rfl:  .  doxycycline (VIBRAMYCIN) 50 MG capsule, Take 2 capsules 1 hour prior to any dental procedures (Patient not taking: Reported on 11/04/2020), Disp: 2 capsule, Rfl: 3 .  EPINEPHrine 0.3 mg/0.3 mL IJ SOAJ injection, Inject 0.3 mg into the muscle as needed for anaphylaxis., Disp: , Rfl:  .  folic acid (FOLVITE) 1 MG tablet, Take 1 mg by mouth daily., Disp: , Rfl:  .  Galcanezumab-gnlm (EMGALITY) 120 MG/ML SOAJ, Inject 120 mg into the skin every 30 (thirty) days., Disp: 1.12 mL, Rfl: 11 .  Ginkgo Biloba 120 MG TABS, Take 120 mg by mouth daily., Disp: , Rfl:  .  Glucosamine-Chondroitin (COSAMIN DS PO), Take 1-2 tablets by mouth See admin instructions. Take 2 tablets by mouth in the morning & take 1 tablet by mouth at night., Disp: , Rfl:  .  metoprolol tartrate (LOPRESSOR) 25 MG tablet, Take 1 tablet (25 mg total) by mouth 2 (two) times daily., Disp:  180 tablet, Rfl: 3 .  Multiple Vitamin (MULTIVITAMIN WITH MINERALS) TABS tablet, Take 1 tablet by mouth daily., Disp: , Rfl:  .  Multiple Vitamins-Minerals (EMERGEN-C IMMUNE PLUS) PACK, Take 1 packet by mouth daily., Disp: , Rfl:  .  Omega-3 1000 MG CAPS, Take 2,000 mg by mouth daily., Disp: , Rfl:  .  omeprazole (PRILOSEC) 20 MG capsule, Take 20 mg by mouth daily before breakfast., Disp: , Rfl:  .  Polyethyl Glycol-Propyl Glycol (LUBRICANT EYE DROPS) 0.4-0.3 % SOLN, Place 1 drop into both eyes 3 (three) times daily as needed (dry/irritated eyes.)., Disp: , Rfl:  .  Rimegepant Sulfate (NURTEC) 75 MG TBDP, Take 75 mg by mouth daily as needed. For migraines. Take as close to onset of migraine as possible. One daily maximum., Disp: 8 tablet, Rfl: 6 .  Turmeric 500 MG TABS, Take 1,000 mg by mouth daily., Disp: , Rfl:  .  valsartan (DIOVAN) 160 MG tablet, Take 1 tablet (160 mg total) by mouth daily., Disp: 90 tablet, Rfl: 3  Past Medical History: Past Medical History:  Diagnosis Date  . Atrial flutter (HCC) 01/2008   RADIOFREQUENCY ABLATION [place]  . Barrett esophagus   . Bee sting allergy   . Diverticulitis   . DJD (degenerative joint disease) of knee    KNEES, BACK (LUMBAR FACET DISEASE) AND HIPS, DALLDORF.  Marland Kitchen GERD (gastroesophageal reflux disease)   .  Hiatal hernia   . History of syncope    RECURRENT  . Hyperlipidemia    BORDERLINE  . Hypertension   . Migraines   . Obesity   . Osteopenia   . S/P RF ablation operation for arrhythmia   . Severe aortic stenosis   . Shoulder tendinitis, left 2008    Tobacco Use: Social History   Tobacco Use  Smoking Status Never Smoker  Smokeless Tobacco Never Used    Labs: Recent Review Flowsheet Data    Labs for ITP Cardiac and Pulmonary Rehab Latest Ref Rng & Units 07/27/2020 07/27/2020 07/27/2020 07/27/2020 07/27/2020   Hemoglobin A1c 4.8 - 5.6 % - - - - -   PHART 7.350 - 7.450 7.397 - 7.357 7.402 7.347(L)   PCO2ART 32.0 -  48.0 mmHg 47.5 - 43.2 41.3 43.9   HCO3 20.0 - 28.0 mmol/L 29.3(H) - 24.8 25.8 24.1   TCO2 22 - 32 mmol/L 31 27 26 27 25    ACIDBASEDEF 0.0 - 2.0 mmol/L - - 1.0 - 2.0   O2SAT % 100.0 - 98.0 99.0 97.0      Capillary Blood Glucose: Lab Results  Component Value Date   GLUCAP 125 (H) 07/29/2020   GLUCAP 120 (H) 07/28/2020   GLUCAP 123 (H) 07/28/2020   GLUCAP 131 (H) 07/28/2020   GLUCAP 129 (H) 07/28/2020     Exercise Target Goals: Exercise Program Goal: Individual exercise prescription set using results from initial 6 min walk test and THRR while considering  patient's activity barriers and safety.   Exercise Prescription Goal: Starting with aerobic activity 30 plus minutes a day, 3 days per week for initial exercise prescription. Provide home exercise prescription and guidelines that participant acknowledges understanding prior to discharge.  Activity Barriers & Risk Stratification:  Activity Barriers & Cardiac Risk Stratification - 11/23/20 1559      Activity Barriers & Cardiac Risk Stratification   Activity Barriers Arthritis;Back Problems;Shortness of Breath;Right Hip Replacement;Joint Problems;Deconditioning;History of Falls;Balance Concerns    Cardiac Risk Stratification High           6 Minute Walk:  6 Minute Walk    Row Name 11/23/20 1541         6 Minute Walk   Phase Initial     Distance 1093 feet     Walk Time 6 minutes     # of Rest Breaks 0     MPH 2.07     METS 2.54     RPE 11     Perceived Dyspnea  2     VO2 Peak 8.89     Symptoms No     Resting HR 90 bpm     Resting BP 118/78     Resting Oxygen Saturation  99 %     Exercise Oxygen Saturation  during 6 min walk 98 %     Max Ex. HR 106 bpm     Max Ex. BP 132/78     2 Minute Post BP 116/80            Oxygen Initial Assessment:   Oxygen Re-Evaluation:   Oxygen Discharge (Final Oxygen Re-Evaluation):   Initial Exercise Prescription:  Initial Exercise Prescription - 11/23/20 1500       Date of Initial Exercise RX and Referring Provider   Date 11/23/20    Referring Provider 11/25/20, MD    Expected Discharge Date 01/19/21      NuStep   Level 2    SPM 80  Minutes 25    METs 1.5      Prescription Details   Frequency (times per week) 3    Duration Progress to 30 minutes of continuous aerobic without signs/symptoms of physical distress      Intensity   THRR 40-80% of Max Heartrate 62-123    Ratings of Perceived Exertion 11-13    Perceived Dyspnea 0-4      Progression   Progression Continue progressive overload as per policy without signs/symptoms or physical distress.      Resistance Training   Training Prescription Yes    Weight 2    Reps 10-15           Perform Capillary Blood Glucose checks as needed.  Exercise Prescription Changes:  Exercise Prescription Changes    Row Name 11/27/20 1627 12/12/20 0900 12/18/20 1615 12/25/20 1615       Response to Exercise   Blood Pressure (Admit) 122/80 112/74 130/90 122/82    Blood Pressure (Exercise) 130/80 130/80 130/82 142/89    Blood Pressure (Exit) 120/72 112/66 120/72 124/68    Heart Rate (Admit) 98 bpm 98 bpm 89 bpm 95 bpm    Heart Rate (Exercise) 100 bpm 104 bpm 100 bpm 105 bpm    Heart Rate (Exit) 85 bpm 83 bpm 89 bpm 95 bpm    Rating of Perceived Exertion (Exercise) 13 10 12 11     Symptoms None None None None    Comments Pt's first day of exercise in the CRP2 program Reviwed METs and home exercise Rx Reviewed METs Reviewed Goals    Duration Progress to 30 minutes of  aerobic without signs/symptoms of physical distress Continue with 30 min of aerobic exercise without signs/symptoms of physical distress. Continue with 30 min of aerobic exercise without signs/symptoms of physical distress. Continue with 30 min of aerobic exercise without signs/symptoms of physical distress.    Intensity THRR unchanged THRR unchanged THRR unchanged THRR unchanged         Progression   Progression Continue to  progress workloads to maintain intensity without signs/symptoms of physical distress. Continue to progress workloads to maintain intensity without signs/symptoms of physical distress. Continue to progress workloads to maintain intensity without signs/symptoms of physical distress. Continue to progress workloads to maintain intensity without signs/symptoms of physical distress.    Average METs 2.5 2.4 2.4 2.4         Resistance Training   Training Prescription Yes Yes Yes Yes    Weight 2 lbs 2 lbs 2 lbs 2 lbs    Reps 10-15 10-15 10-15 10-15    Time 10 Minutes 10 Minutes 10 Minutes 10 Minutes         Interval Training   Interval Training No No No No         NuStep   Level 2 2 2 2     SPM 90 90 90 90    Minutes 30 30 30 30     METs 2.5 2.4 2.4 2.4         Home Exercise Plan   Plans to continue exercise at -- Home (comment) Home (comment) Home (comment)    Frequency -- Add 2 additional days to program exercise sessions. Add 2 additional days to program exercise sessions. Add 2 additional days to program exercise sessions.    Initial Home Exercises Provided -- 12/08/20 12/08/20 12/08/20           Exercise Comments:  Exercise Comments    Row Name 11/27/20 1630 12/08/20 1600 12/18/20  1615 12/25/20 1622     Exercise Comments Pt's first day in the CRP2 program. Pt tolerated the session well with no complaints and is off to a good start. Reviewed METs and home exercise Rx today with patient. Pt verbalized understanding of the home exercise Rx and was provided a copy. Reviewed METS. Pt out all last week with miagraine. Feeling better. Able to resume workloads at previous level. Reviewed Goals/Mets today.           Exercise Goals and Review:  Exercise Goals    Row Name 11/23/20 1531             Exercise Goals   Increase Physical Activity Yes       Intervention Provide advice, education, support and counseling about physical activity/exercise needs.;Develop an individualized  exercise prescription for aerobic and resistive training based on initial evaluation findings, risk stratification, comorbidities and participant's personal goals.       Expected Outcomes Short Term: Attend rehab on a regular basis to increase amount of physical activity.;Long Term: Add in home exercise to make exercise part of routine and to increase amount of physical activity.;Long Term: Exercising regularly at least 3-5 days a week.       Increase Strength and Stamina Yes       Intervention Provide advice, education, support and counseling about physical activity/exercise needs.;Develop an individualized exercise prescription for aerobic and resistive training based on initial evaluation findings, risk stratification, comorbidities and participant's personal goals.       Expected Outcomes Short Term: Increase workloads from initial exercise prescription for resistance, speed, and METs.;Long Term: Improve cardiorespiratory fitness, muscular endurance and strength as measured by increased METs and functional capacity ( );Short Term: Perform resistance training exercises routinely during rehab and add in resistance training at home       Able to understand and use rate of perceived exertion (RPE) scale Yes       Intervention Provide education and explanation on how to use RPE scale       Expected Outcomes Short Term: Able to use RPE daily in rehab to express subjective intensity level;Long Term:  Able to use RPE to guide intensity level when exercising independently       Knowledge and understanding of Target Heart Rate Range (THRR) Yes       Intervention Provide education and explanation of THRR including how the numbers were predicted and where they are located for reference       Expected Outcomes Short Term: Able to state/look up THRR;Long Term: Able to use THRR to govern intensity when exercising independently;Short Term: Able to use daily as guideline for intensity in rehab       Understanding  of Exercise Prescription Yes       Intervention Provide education, explanation, and written materials on patient's individual exercise prescription       Expected Outcomes Short Term: Able to explain program exercise prescription;Long Term: Able to explain home exercise prescription to exercise independently              Exercise Goals Re-Evaluation :  Exercise Goals Re-Evaluation    Row Name 11/27/20 1630 12/08/20 1600 12/25/20 1520         Exercise Goal Re-Evaluation   Exercise Goals Review Increase Physical Activity;Increase Strength and Stamina;Able to understand and use rate of perceived exertion (RPE) scale;Knowledge and understanding of Target Heart Rate Range (THRR);Understanding of Exercise Prescription Increase Physical Activity;Increase Strength and Stamina;Able to understand and use rate of  perceived exertion (RPE) scale;Knowledge and understanding of Target Heart Rate Range (THRR);Able to check pulse independently;Understanding of Exercise Prescription Increase Physical Activity;Increase Strength and Stamina;Able to understand and use rate of perceived exertion (RPE) scale;Knowledge and understanding of Target Heart Rate Range (THRR);Able to check pulse independently     Comments Pt's first day of exercise in the CRP2 program. Pt understnads the THRR, RPE scale, and Exercise Rx. Reviewed METs and home exercise Rx. Pt walks at work and home. Encouraged 30 minutes of walking 2-3x/week. Pt making progress in the program. Reviewed Goals/mets. Pt is walking on off days and is feeling stronger.     Expected Outcomes Will continue to monitor patients and progress exercise workloads as tolerated. Will continue to monitor patient and encouraged waliking at home. Will continue to monitor patient and encouraged waliking at home.             Discharge Exercise Prescription (Final Exercise Prescription Changes):  Exercise Prescription Changes - 12/25/20 1615      Response to Exercise    Blood Pressure (Admit) 122/82    Blood Pressure (Exercise) 142/89    Blood Pressure (Exit) 124/68    Heart Rate (Admit) 95 bpm    Heart Rate (Exercise) 105 bpm    Heart Rate (Exit) 95 bpm    Rating of Perceived Exertion (Exercise) 11    Symptoms None    Comments Reviewed Goals    Duration Continue with 30 min of aerobic exercise without signs/symptoms of physical distress.    Intensity THRR unchanged      Progression   Progression Continue to progress workloads to maintain intensity without signs/symptoms of physical distress.    Average METs 2.4      Resistance Training   Training Prescription Yes    Weight 2 lbs    Reps 10-15    Time 10 Minutes      Interval Training   Interval Training No      NuStep   Level 2    SPM 90    Minutes 30    METs 2.4      Home Exercise Plan   Plans to continue exercise at Home (comment)    Frequency Add 2 additional days to program exercise sessions.    Initial Home Exercises Provided 12/08/20           Nutrition:  Target Goals: Understanding of nutrition guidelines, daily intake of sodium 1500mg , cholesterol 200mg , calories 30% from fat and 7% or less from saturated fats, daily to have 5 or more servings of fruits and vegetables.  Biometrics:  Pre Biometrics - 11/23/20 1415      Pre Biometrics   Waist Circumference 41.5 inches    Hip Circumference 46.5 inches    Waist to Hip Ratio 0.89 %    Triceps Skinfold 47 mm    % Body Fat 46.1 %    Grip Strength 29 kg    Flexibility 15.75 in    Single Leg Stand 1 seconds   High fall risk           Nutrition Therapy Plan and Nutrition Goals:  Nutrition Therapy & Goals - 12/11/20 1009      Nutrition Therapy   Diet TLC      Personal Nutrition Goals   Nutrition Goal Pt to identify food quantities necessary to achieve weight loss of 6-24 lb at graduation from cardiac rehab.    Personal Goal #2 Pt to choose healthier restaurants    Personal Goal #  3 Pt to build a healthy plate  including vegetables, fruits, whole grains, and low-fat dairy products in a heart healthy meal plan.      Intervention Plan   Intervention Prescribe, educate and counsel regarding individualized specific dietary modifications aiming towards targeted core components such as weight, hypertension, lipid management, diabetes, heart failure and other comorbidities.;Nutrition handout(s) given to patient.    Expected Outcomes Short Term Goal: A plan has been developed with personal nutrition goals set during dietitian appointment.;Long Term Goal: Adherence to prescribed nutrition plan.           Nutrition Assessments:  MEDIFICTS Score Key:  ?70 Need to make dietary changes   40-70 Heart Healthy Diet  ? 40 Therapeutic Level Cholesterol Diet  Flowsheet Row CARDIAC REHAB PHASE II EXERCISE from 12/08/2020 in [facility] CARDIAC REHAB  Picture Your Plate Total Score on Admission 52     Picture Your Plate Scores:  <16 Unhealthy dietary pattern with much room for improvement.  41-50 Dietary pattern unlikely to meet recommendations for good health and room for improvement.  51-60 More healthful dietary pattern, with some room for improvement.   >60 Healthy dietary pattern, although there may be some specific behaviors that could be improved.    Nutrition Goals Re-Evaluation:  Nutrition Goals Re-Evaluation    Row Name 12/11/20 1010 01/01/21 1425           Goals   Current Weight 194 lb (88 kg) 198 lb 10.2 oz (90.1 kg)      Nutrition Goal Pt to identify food quantities necessary to achieve weight loss of 6-24 lb at graduation from cardiac rehab. --             Personal Goal #2 Re-Evaluation   Personal Goal #2 Pt to choose healthier restaurants Pt to choose healthier restaurants             Personal Goal #3 Re-Evaluation   Personal Goal #3 Pt to build a healthy plate including vegetables, fruits, whole grains, and low-fat dairy products in a heart healthy meal plan. Pt  to build a healthy plate including vegetables, fruits, whole grains, and low-fat dairy products in a heart healthy meal plan.             Nutrition Goals Discharge (Final Nutrition Goals Re-Evaluation):  Nutrition Goals Re-Evaluation - 01/01/21 1425      Goals   Current Weight 198 lb 10.2 oz (90.1 kg)      Personal Goal #2 Re-Evaluation   Personal Goal #2 Pt to choose healthier restaurants      Personal Goal #3 Re-Evaluation   Personal Goal #3 Pt to build a healthy plate including vegetables, fruits, whole grains, and low-fat dairy products in a heart healthy meal plan.           Psychosocial: Target Goals: Acknowledge presence or absence of significant depression and/or stress, maximize coping skills, provide positive support system. Participant is able to verbalize types and ability to use techniques and skills needed for reducing stress and depression.  Initial Review & Psychosocial Screening:  Initial Psych Review & Screening - 11/23/20 1501      Initial Review   Current issues with Current Stress Concerns    Source of Stress Concerns Unable to participate in former interests or hobbies;Unable to perform yard/household activities;Occupation    Comments Rebecca Farley      [employer]  Good Support System? Yes   Rebecca Farley lives with her daughter who she has for support     Barriers   Psychosocial barriers to participate in program The patient should benefit from training in stress management and relaxation.      Screening Interventions   Interventions Encouraged to exercise    Expected Outcomes Long Term Goal: Stressors or current issues are controlled or eliminated.           Quality of Life Scores:  Quality of Life - 11/23/20 1533      Quality of Life   Select Quality of Life      Quality of Life Scores   Health/Function Pre 24.64 %    Socioeconomic Pre 24.71 %     Psych/Spiritual Pre 22.71 %    Family Pre 25.5 %    GLOBAL Pre 24.34 %          Scores of 19 and below usually indicate a poorer quality of life in these areas.  A difference of  2-3 points is a clinically meaningful difference.  A difference of 2-3 points in the total score of the Quality of Life Index has been associated with significant improvement in overall quality of life, self-image, physical symptoms, and general health in studies assessing change in quality of life.  PHQ-9: Recent Review Flowsheet Data    Depression screen [facility] 2/9 11/23/2020   Decreased Interest 0   Down, Depressed, Hopeless 0   PHQ - 2 Score 0     Interpretation of Total Score  Total Score Depression Severity:  1-4 = Minimal depression, 5-9 = Mild depression, 10-14 = Moderate depression, 15-19 = Moderately severe depression, 20-27 = Severe depression   Psychosocial Evaluation and Intervention:   Psychosocial Re-Evaluation:  Psychosocial Re-Evaluation    Row Name 12/05/20 1811 01/03/21 1234           Psychosocial Re-Evaluation   Current issues with Current Stress Concerns Current Stress Concerns      Comments Rebecca Farley has not voiced any increased concerns or stressors Rebecca Farley has not voiced any increased concerns or stressors      Expected Outcomes Will continue to monitor and provide support as needed Will continue to monitor and provide support as needed      Interventions Stress management education;Relaxation education Stress management education;Relaxation education      Continue Psychosocial Services  No Follow up required No Follow up required      Comments -- Will continue to monitor and offer support as needed             Initial Review   Source of Stress Concerns Unable to perform yard/household activities;Unable to participate in former interests or hobbies Unable to perform yard/household activities;Unable to participate in former interests or hobbies             Psychosocial Discharge  (Final Psychosocial Re-Evaluation):  Psychosocial Re-Evaluation - 01/03/21 1234      Psychosocial Re-Evaluation   Current issues with Current Stress Concerns    Comments Rebecca Farley has not voiced any increased concerns or stressors    Expected Outcomes Will continue to monitor and provide support as needed    Interventions Stress management education;Relaxation education    Continue Psychosocial Services  No Follow up required    Comments Will continue to monitor and offer support as needed      Initial Review   Source of Stress Concerns Unable to perform yard/household activities;Unable to participate in former interests or hobbies  Vocational Rehabilitation: Provide vocational rehab assistance to qualifying candidates.   Vocational Rehab Evaluation & Intervention:  Vocational Rehab - 11/23/20 1603      Initial Vocational Rehab Evaluation & Intervention   Assessment shows need for Vocational Rehabilitation No   Rebecca Farley works full time at [employer] and does not need vocational rehab at this time          Education: Education Goals: Education classes will be provided on a weekly basis, covering required topics. Participant will state understanding/return demonstration of topics presented.  Learning Barriers/Preferences:  Learning Barriers/Preferences - 11/23/20 1528      Learning Barriers/Preferences   Learning Barriers Sight   wears glasses   Learning Preferences Video;Skilled Demonstration;Pictoral;Individual Instruction;Group Instruction;Computer/Internet           Education Topics: Hypertension, Hypertension Reduction -Define heart disease and high blood pressure. Discus how high blood pressure affects the body and ways to reduce high blood pressure.   Exercise and Your Heart -Discuss why it is important to exercise, the FITT principles of exercise, normal and abnormal responses to exercise, and how to exercise safely.   Angina -Discuss definition of  angina, causes of angina, treatment of angina, and how to decrease risk of having angina.   Cardiac Medications -Review what the following cardiac medications are used for, how they affect the body, and side effects that may occur when taking the medications.  Medications include Aspirin, Beta blockers, calcium channel blockers, ACE Inhibitors, angiotensin receptor blockers, diuretics, digoxin, and antihyperlipidemics.   Congestive Heart Failure -Discuss the definition of CHF, how to live with CHF, the signs and symptoms of CHF, and how keep track of weight and sodium intake.   Heart Disease and Intimacy -Discus the effect sexual activity has on the heart, how changes occur during intimacy as we age, and safety during sexual activity.   Smoking Cessation / COPD -Discuss different methods to quit smoking, the health benefits of quitting smoking, and the definition of COPD.   Nutrition I: Fats -Discuss the types of cholesterol, what cholesterol does to the heart, and how cholesterol levels can be controlled.   Nutrition II: Labels -Discuss the different components of food labels and how to read food label   Heart Parts/Heart Disease and PAD -Discuss the anatomy of the heart, the pathway of blood circulation through the heart, and these are affected by heart disease.   Stress I: Signs and Symptoms -Discuss the causes of stress, how stress may lead to anxiety and depression, and ways to limit stress.   Stress II: Relaxation -Discuss different types of relaxation techniques to limit stress.   Warning Signs of Stroke / TIA -Discuss definition of a stroke, what the signs and symptoms are of a stroke, and how to identify when someone is having stroke.   Knowledge Questionnaire Score:  Knowledge Questionnaire Score - 11/23/20 1533      Knowledge Questionnaire Score   Pre Score 20/24           Core Components/Risk Factors/Patient Goals at Admission:  Personal Goals and  Risk Factors at Admission - 11/23/20 1529      Core Components/Risk Factors/Patient Goals on Admission    Weight Management Yes;Obesity;Weight Loss    Intervention Weight Management: Develop a combined nutrition and exercise program designed to reach desired caloric intake, while maintaining appropriate intake of nutrient and fiber, sodium and fats, and appropriate energy expenditure required for the weight goal.;Weight Management: Provide education and appropriate resources to help participant work on  and attain dietary goals.;Weight Management/Obesity: Establish reasonable short term and long term weight goals.;Obesity: Provide education and appropriate resources to help participant work on and attain dietary goals.    Admit Weight 194 lb 7.1 oz (88.2 kg)    Expected Outcomes Short Term: Continue to assess and modify interventions until short term weight is achieved;Long Term: Adherence to nutrition and physical activity/exercise program aimed toward attainment of established weight goal;Weight Maintenance: Understanding of the daily nutrition guidelines, which includes 25-35% calories from fat, 7% or less cal from saturated fats, less than  cholesterol, less than 1.5gm of sodium, & 5 or more servings of fruits and vegetables daily;Weight Loss: Understanding of general recommendations for a balanced deficit meal plan, which promotes 1-2 lb weight loss per week and includes a negative energy balance of [phone] kcal/d;Understanding recommendations for meals to include 15-35% energy as protein, 25-35% energy from fat, 35-60% energy from carbohydrates, less than  of dietary cholesterol, 20-35 gm of total fiber daily;Understanding of distribution of calorie intake throughout the day with the consumption of 4-5 meals/snacks    Improve shortness of breath with ADL's Yes    Expected Outcomes Short Term: Improve cardiorespiratory fitness to achieve a reduction of symptoms when performing ADLs;Long Term:  Be able to perform more ADLs without symptoms or delay the onset of symptoms    Hypertension Yes    Intervention Provide education on lifestyle modifcations including regular physical activity/exercise, weight management, moderate sodium restriction and increased consumption of fresh fruit, vegetables, and low fat dairy, alcohol moderation, and smoking cessation.;Monitor prescription use compliance.    Expected Outcomes Short Term: Continued assessment and intervention until BP is < 140/87mm HG in hypertensive participants. < 130/78mm HG in hypertensive participants with diabetes, heart failure or chronic kidney disease.;Long Term: Maintenance of blood pressure at goal levels.    Lipids Yes    Intervention Provide education and support for participant on nutrition & aerobic/resistive exercise along with prescribed medications to achieve LDL 70mg , HDL >40mg .    Expected Outcomes Short Term: Participant states understanding of desired cholesterol values and is compliant with medications prescribed. Participant is following exercise prescription and nutrition guidelines.;Long Term: Cholesterol controlled with medications as prescribed, with individualized exercise RX and with personalized nutrition plan. Value goals: LDL < , HDL > 40 mg.    Stress Yes    Intervention Offer individual and/or small group education and counseling on adjustment to heart disease, stress management and health-related lifestyle change. Teach and support self-help strategies.    Expected Outcomes Short Term: Participant demonstrates changes in health-related behavior, relaxation and other stress management skills, ability to obtain effective social support, and compliance with psychotropic medications if prescribed.;Long Term: Emotional wellbeing is indicated by absence of clinically significant psychosocial distress or social isolation.           Core Components/Risk Factors/Patient Goals Review:   Goals and Risk Factor  Review    Row Name 11/28/20 1212 12/05/20 1813 01/03/21 1235         Core Components/Risk Factors/Patient Goals Review   Personal Goals Review Weight Management/Obesity;Diabetes;Lipids;Improve shortness of breath with ADL's;Hypertension Weight Management/Obesity;Diabetes;Lipids;Improve shortness of breath with ADL's;Hypertension Weight Management/Obesity;Diabetes;Lipids;Improve shortness of breath with ADL's;Hypertension     Review Rebecca Farley started exercise on 11/27/20. Rebecca Farley did well with exercise. Vital signs were stable Rebecca Farley is off to a good start to exercise. Vital signs have been stable. Rebecca Farley reports feeling a little less short of breath Rebecca Farley continues to do well with exercise. Vital signs have  been stable. Rebecca Farley  continues to reports feeling a little less short of breath and says that she has more energy     Expected Outcomes Rebecca Farley will continue to participate in phase 2 cardiac rehab for exercise, nutrition and lifestyle modifications Rebecca Farley will continue to participate in phase 2 cardiac rehab for exercise, nutrition and lifestyle modifications Rebecca Farley will continue to participate in phase 2 cardiac rehab for exercise, nutrition and lifestyle modifications            Core Components/Risk Factors/Patient Goals at Discharge (Final Review):   Goals and Risk Factor Review - 01/03/21 1235      Core Components/Risk Factors/Patient Goals Review   Personal Goals Review Weight Management/Obesity;Diabetes;Lipids;Improve shortness of breath with ADL's;Hypertension    Review Rebecca Farley continues to do well with exercise. Vital signs have been stable. Rebecca Farley  continues to reports feeling a little less short of breath and says that she has more energy    Expected Outcomes Rebecca Farley will continue to participate in phase 2 cardiac rehab for exercise, nutrition and lifestyle modifications           ITP Comments:  ITP Comments    Row Name 11/23/20 1500 12/05/20 1810 01/03/21 1233       ITP Comments Dr Armanda Magic MD, Medical Director 30 day ITP Review. Jett is off to a good start to exercise 30 day ITP Review. Venus has good attendance and participation in phase 2 cardiac rehab            Comments: See ITP comments.Gladstone Lighter, RN,BSN 01/03/2021 12:37 PM

## 2021-01-04 ENCOUNTER — Encounter: Payer: Self-pay | Admitting: Interventional Cardiology

## 2021-01-04 ENCOUNTER — Ambulatory Visit (INDEPENDENT_AMBULATORY_CARE_PROVIDER_SITE_OTHER): Payer: Medicare Other | Admitting: Interventional Cardiology

## 2021-01-04 VITALS — BP 116/76 | HR 70 | Ht 65.25 in | Wt 198.4 lb

## 2021-01-04 DIAGNOSIS — I350 Nonrheumatic aortic (valve) stenosis: Secondary | ICD-10-CM | POA: Diagnosis not present

## 2021-01-04 DIAGNOSIS — I4892 Unspecified atrial flutter: Secondary | ICD-10-CM | POA: Diagnosis not present

## 2021-01-04 DIAGNOSIS — I10 Essential (primary) hypertension: Secondary | ICD-10-CM

## 2021-01-04 DIAGNOSIS — E876 Hypokalemia: Secondary | ICD-10-CM | POA: Diagnosis not present

## 2021-01-04 LAB — BASIC METABOLIC PANEL
BUN/Creatinine Ratio: 19 (ref 12–28)
BUN: 15 mg/dL (ref 8–27)
CO2: 22 mmol/L (ref 20–29)
Calcium: 9.2 mg/dL (ref 8.7–10.3)
Chloride: 103 mmol/L (ref 96–106)
Creatinine, Ser: 0.79 mg/dL (ref 0.57–1.00)
Glucose: 89 mg/dL (ref 65–99)
Potassium: 4.3 mmol/L (ref 3.5–5.2)
Sodium: 140 mmol/L (ref 134–144)
eGFR: 82 mL/min/{1.73_m2} (ref >59)

## 2021-01-04 NOTE — Patient Instructions (Signed)
Medication Instructions:  Your physician has recommended you make the following change in your medication: Stop metoprolol  *If you need a refill on your cardiac medications before your next appointment, please call your pharmacy*   Lab Work: Lab work to be done today--BMP If you have labs (blood work) drawn today and your tests are completely normal, you will receive your results only by: Marland Kitchen MyChart Message (if you have MyChart) OR . A paper copy in the mail If you have any lab test that is abnormal or we need to change your treatment, we will call you to review the results.   Testing/Procedures: none   Follow-Up: At [facility], you and your health needs are our priority.  As part of our continuing mission to provide you with exceptional heart care, we have created designated Provider Care Teams.  These Care Teams include your primary Cardiologist (physician) and Advanced Practice Providers (APPs -  Physician Assistants and Nurse Practitioners) who all work together to provide you with the care you need, when you need it.  We recommend signing up for the patient portal called "MyChart".  Sign up information is provided on this After Visit Summary.  MyChart is used to connect with patients for Virtual Visits (Telemedicine).  Patients are able to view lab/test results, encounter notes, upcoming appointments, etc.  Non-urgent messages can be sent to your provider as well.   To learn more about what you can do with MyChart, go to [email or web address].    Your next appointment:   6 month(s)  The format for your next appointment:   In Person  Provider:   You may see Lance Muss, MD or one of the following Advanced Practice Providers on your designated Care Team:    Ronie Spies, PA-C  Jacolyn Reedy, PA-C    Other Instructions

## 2021-01-04 NOTE — Progress Notes (Signed)
Cardiology Office Note   Date:  01/04/2021   ID:  Rebecca Farley, DOB 1956/09/21, MRN [id number]  PCP:  Lavone Orn, MD    No chief complaint on file.  S/p AVR  Wt Readings from Last 3 Encounters:  01/04/21 198 lb 6.4 oz (90 kg)  01/01/21 198 lb 10.2 oz (90.1 kg)  12/11/20 194 lb (88 kg)       History of Present Illness: Rebecca Farley is a 65 y.o. female  With a h/o atrial flutter and AS.Works at [employer].  She had a h/o atrial flutter in the past and had an ablation in [place] many years ago.  She had a heart murmur on her routine exam and was sent for an echoin 2020. She was not having any new symptoms. She does report some DOE for 15 years. She has had soe dizzy spells for 15 years as well. It can happen even when she was sitting down. She wore a monitor many years ago and this was ewhen the AFlutter was diagnosed. HR was 300 bpm per her report.  She had severe AS treated with SAVR in 07/2020 (Aortic valve replacement using a 23 mm Edwards INSPIRIS RESILIAvalve.)  Potassium was low in 3/21.    Denies : Chest pain. Dizziness. Leg edema. Nitroglycerin use. Orthopnea. Palpitations. Paroxysmal nocturnal dyspnea.  Syncope.    Still feels like she is more tired than she should be after surgery, but notes a significant improvement compared to prior to surgery.    Past Medical History:  Diagnosis Date  . Atrial flutter ([place]) 01/2008   RADIOFREQUENCY ABLATION [place]  . Barrett esophagus   . Bee sting allergy   . Diverticulitis   . DJD (degenerative joint disease) of knee    KNEES, BACK (LUMBAR FACET DISEASE) AND HIPS, DALLDORF.  Marland Kitchen GERD (gastroesophageal reflux disease)   . Hiatal hernia   . History of syncope    RECURRENT  . Hyperlipidemia    BORDERLINE  . Hypertension   . Migraines   . Obesity   . Osteopenia   . S/P RF ablation operation for arrhythmia   . Severe aortic stenosis   . Shoulder tendinitis, left 2008     Past Surgical History:  Procedure Laterality Date  . ABDOMINAL HYSTERECTOMY    . AORTIC VALVE REPLACEMENT N/A 07/27/2020   Procedure: AORTIC VALVE REPLACEMENT (AVR) USING 23 MM INSPIRIS RESILIA  AORTIC VALVE;  Surgeon: Gaye Pollack, MD;  Location: [facility];  Service: Open Heart Surgery;  Laterality: N/A;  . BREAST BIOPSY Left    10 yrs +  . CARDIAC CATHETERIZATION    . CESAREAN SECTION     1979, 1984  . CHOLECYSTECTOMY    . COLONOSCOPY  11/03/2012   Procedure: COLONOSCOPY;  Surgeon: Garlan Fair, MD;  Location: [facility];  Service: Endoscopy;  Laterality: N/A;  . ESOPHAGOGASTRODUODENOSCOPY  11/03/2012   Procedure: ESOPHAGOGASTRODUODENOSCOPY (EGD);  Surgeon: Garlan Fair, MD;  Location: [facility];  Service: Endoscopy;  Laterality: N/A;  . implanted loop device    . RIGHT HEART CATH AND CORONARY ANGIOGRAPHY N/A 07/07/2020   Procedure: RIGHT HEART CATH AND CORONARY ANGIOGRAPHY;  Surgeon: Sherren Mocha, MD;  Location: [facility];  Service: Cardiovascular;  Laterality: N/A;  . TEE WITHOUT CARDIOVERSION N/A 07/27/2020   Procedure: TRANSESOPHAGEAL ECHOCARDIOGRAM (TEE);  Surgeon: Gaye Pollack, MD;  Location: [facility];  Service: Open Heart Surgery;  Laterality: N/A;  . TOTAL HIP  ARTHROPLASTY Right 11/23/2019   Procedure: RIGHT TOTAL HIP ARTHROPLASTY ANTERIOR APPROACH;  Surgeon: Melrose Nakayama, MD;  Location: [facility];  Service: Orthopedics;  Laterality: Right;     Current Outpatient Medications  Medication Sig Dispense Refill  . acetaminophen (TYLENOL) 500 MG tablet Take 500-1,000 mg by mouth every 6 (six) hours as needed (for pain.).    Marland Kitchen aspirin EC 325 MG EC tablet Take 1 tablet (325 mg total) by mouth daily. 30 tablet 0  . b complex vitamins tablet Take 1 tablet by mouth daily.    . Calcium Carb-Cholecalciferol (CALCIUM+D3 PO) Take 1 tablet by mouth daily.    Marland Kitchen doxycycline (VIBRAMYCIN) 50 MG capsule Take 2 capsules 1 hour prior to any dental procedures 2 capsule 3   . EPINEPHrine 0.3 mg/0.3 mL IJ SOAJ injection Inject 0.3 mg into the muscle as needed for anaphylaxis.    . folic acid (FOLVITE) 1 MG tablet Take 1 mg by mouth daily.    . Galcanezumab-gnlm (EMGALITY) 120 MG/ML SOAJ Inject 120 mg into the skin every 30 (thirty) days. 1.12 mL 11  . Ginkgo Biloba 120 MG TABS Take 120 mg by mouth daily.    . Glucosamine-Chondroitin (COSAMIN DS PO) Take 1-2 tablets by mouth See admin instructions. Take 2 tablets by mouth in the morning & take 1 tablet by mouth at night.    . metoprolol tartrate (LOPRESSOR) 25 MG tablet Take 1 tablet (25 mg total) by mouth 2 (two) times daily. 180 tablet 3  . Multiple Vitamin (MULTIVITAMIN WITH MINERALS) TABS tablet Take 1 tablet by mouth daily.    . Multiple Vitamins-Minerals (EMERGEN-C IMMUNE PLUS) PACK Take 1 packet by mouth daily.    . Omega-3 1000 MG CAPS Take 2,000 mg by mouth daily.    Marland Kitchen omeprazole (PRILOSEC) 20 MG capsule Take 20 mg by mouth daily before breakfast.    . Polyethyl Glycol-Propyl Glycol (LUBRICANT EYE DROPS) 0.4-0.3 % SOLN Place 1 drop into both eyes 3 (three) times daily as needed (dry/irritated eyes.).    Marland Kitchen Rimegepant Sulfate (NURTEC) 75 MG TBDP Take 75 mg by mouth daily as needed. For migraines. Take as close to onset of migraine as possible. One daily maximum. 8 tablet 6  . Turmeric 500 MG TABS Take 1,000 mg by mouth daily.    . valsartan (DIOVAN) 160 MG tablet Take 1 tablet (160 mg total) by mouth daily. 90 tablet 3   No current facility-administered medications for this visit.    Allergies:   Mobic [meloxicam], Penicillins, Codeine, and Nsaids    Social History:  The patient  reports that she has never smoked. She has never used smokeless tobacco. She reports current alcohol use. She reports that she does not use drugs.   Family History:  The patient's family history includes Aneurysm in her mother; Colon cancer (age of onset: 20) in her sister; Colon cancer (age of onset: 33) in her brother; Emphysema  in her father; Heart failure in her mother; Hypertension in her mother; Idiopathic pulmonary fibrosis in her brother; Kidney failure in her mother; Liver cancer in her maternal uncle; Lung cancer in her father, maternal uncle, and paternal uncle; Melanoma in her sister; Melanoma (age of onset: 107) in her sister; Other in her sister, sister, sister, and sister; Ovarian cancer in her paternal grandmother; Prostate cancer in her father.    ROS:  Please see the history of present illness.   Otherwise, review of systems are positive for fatigue after cardiac rehab.  All other systems are reviewed and negative.    PHYSICAL EXAM: VS:  BP 116/76   Pulse 70   Ht 5' 5.25" (1.657 m)   Wt 198 lb 6.4 oz (90 kg)   SpO2 95%   BMI 32.76 kg/m  , BMI Body mass index is 32.76 kg/m. GEN: Well nourished, well developed, in no acute distress  HEENT: normal  Neck: no JVD, carotid bruits, or masses Cardiac: RRR; no murmurs, rubs, or gallops,no edema  Respiratory:  clear to auscultation bilaterally, normal work of breathing GI: soft, nontender, nondistended, + BS MS: no deformity or atrophy  Skin: warm and dry, no rash Neuro:  Strength and sensation are intact Psych: euthymic mood, full affect     Recent Labs: 07/28/2020: Magnesium 2.0 12/25/2020: ALT 17; BUN 16; Creatinine, Ser 0.90; Hemoglobin 12.6; NT-Pro BNP 83; Platelets 242; Potassium 3.3; Sodium 136; TSH 2.890   Lipid Panel No results found for: CHOL, TRIG, HDL, CHOLHDL, VLDL, LDLCALC, LDLDIRECT   Other studies Reviewed: Additional studies/ records that were reviewed today with results demonstrating: EP visits reviewed.   ASSESSMENT AND PLAN:  1. S/p AVR: SBE prophylaxis.  Tolerating cardiac rehab well.  She still feels tired after the surgery.  I think it will take some time for her stamina to build.  No signs of volume overload.  BNP was normal. 2. AFlutter: In NSR.  Given her fatigue, will stop metoprolol.  If fatigue is not improved, she  will let us know through my chart message.  We could call in a prescription for Lasix 20 mg daily with potassium supplement, to be used as needed for when she feels slightly volume overloaded.  I would not prescribe this every day. 3. HTN/Hypokalemia: HCTZ was stopped.  Blood pressure still controlled.  Recheck be met today.  We also discussed increasing intake of potassium rich foods.   Current medicines are reviewed at length with the patient today.  The patient concerns regarding her medicines were addressed.  The following changes have been made:  No change  Labs/ tests ordered today include:  No orders of the defined types were placed in this encounter.   Recommend 150 minutes/week of aerobic exercise Low fat, low carb, high fiber diet recommended  Disposition:   FU in 6 months   Signed, Larae Grooms, MD  01/04/2021 8:52 AM    [facility] [address], [place], [place]  [address] Phone: [phone]; Fax: [phone]

## 2021-01-05 ENCOUNTER — Other Ambulatory Visit: Payer: Self-pay

## 2021-01-05 ENCOUNTER — Encounter (HOSPITAL_COMMUNITY)
Admission: RE | Admit: 2021-01-05 | Discharge: 2021-01-05 | Disposition: A | Discharge status: Home / Self Care | Payer: Medicare Other | Source: Ambulatory Visit | Attending: Interventional Cardiology | Admitting: Interventional Cardiology

## 2021-01-05 DIAGNOSIS — Z952 Presence of prosthetic heart valve: Secondary | ICD-10-CM | POA: Diagnosis present

## 2021-01-08 ENCOUNTER — Other Ambulatory Visit: Payer: Self-pay

## 2021-01-08 ENCOUNTER — Encounter (HOSPITAL_COMMUNITY)
Admission: RE | Admit: 2021-01-08 | Discharge: 2021-01-08 | Disposition: A | Discharge status: Home / Self Care | Payer: Medicare Other | Source: Ambulatory Visit | Attending: Interventional Cardiology | Admitting: Interventional Cardiology

## 2021-01-08 DIAGNOSIS — Z952 Presence of prosthetic heart valve: Secondary | ICD-10-CM | POA: Diagnosis not present

## 2021-01-10 ENCOUNTER — Encounter (HOSPITAL_COMMUNITY)
Admission: RE | Admit: 2021-01-10 | Discharge: 2021-01-10 | Disposition: A | Discharge status: Home / Self Care | Payer: Medicare Other | Source: Ambulatory Visit | Attending: Interventional Cardiology | Admitting: Interventional Cardiology

## 2021-01-10 ENCOUNTER — Other Ambulatory Visit: Payer: Self-pay

## 2021-01-10 DIAGNOSIS — Z952 Presence of prosthetic heart valve: Secondary | ICD-10-CM

## 2021-01-12 ENCOUNTER — Encounter (HOSPITAL_COMMUNITY)
Admission: RE | Admit: 2021-01-12 | Discharge: 2021-01-12 | Disposition: A | Discharge status: Home / Self Care | Payer: Medicare Other | Source: Ambulatory Visit | Attending: Interventional Cardiology | Admitting: Interventional Cardiology

## 2021-01-12 ENCOUNTER — Other Ambulatory Visit: Payer: Self-pay

## 2021-01-12 DIAGNOSIS — Z952 Presence of prosthetic heart valve: Secondary | ICD-10-CM | POA: Diagnosis not present

## 2021-01-15 ENCOUNTER — Other Ambulatory Visit: Payer: Self-pay

## 2021-01-15 ENCOUNTER — Encounter (HOSPITAL_COMMUNITY)
Admission: RE | Admit: 2021-01-15 | Discharge: 2021-01-15 | Disposition: A | Discharge status: Home / Self Care | Payer: Medicare Other | Source: Ambulatory Visit | Attending: Interventional Cardiology | Admitting: Interventional Cardiology

## 2021-01-15 DIAGNOSIS — Z952 Presence of prosthetic heart valve: Secondary | ICD-10-CM | POA: Diagnosis not present

## 2021-01-17 ENCOUNTER — Other Ambulatory Visit: Payer: Self-pay

## 2021-01-17 ENCOUNTER — Encounter (HOSPITAL_COMMUNITY)
Admission: RE | Admit: 2021-01-17 | Discharge: 2021-01-17 | Disposition: A | Discharge status: Home / Self Care | Payer: Medicare Other | Source: Ambulatory Visit | Attending: Interventional Cardiology | Admitting: Interventional Cardiology

## 2021-01-17 DIAGNOSIS — Z952 Presence of prosthetic heart valve: Secondary | ICD-10-CM | POA: Diagnosis not present

## 2021-01-17 NOTE — Progress Notes (Signed)
Discharge Progress Report  Patient Details  Name: Rebecca Farley MRN: [id number] Date of Birth: 04-09-1956 Referring Provider:   Flowsheet Row CARDIAC REHAB PHASE II ORIENTATION from 11/23/2020 in [place]  Referring Provider Larae Grooms, MD       Number of Visits: 21  Reason for Discharge:  Patient reached a stable level of exercise. Patient independent in their exercise. Patient has met program and personal goals.  Smoking History:  Social History   Tobacco Use  Smoking Status Never Smoker  Smokeless Tobacco Never Used    Diagnosis:  07/27/20 S/P AVR  ADL UCSD:   Initial Exercise Prescription:  Initial Exercise Prescription - 11/23/20 1500      Date of Initial Exercise RX and Referring Provider   Date 11/23/20    Referring Provider Larae Grooms, MD    Expected Discharge Date 01/19/21      NuStep   Level 2    SPM 80    Minutes 25    METs 1.5      Prescription Details   Frequency (times per week) 3    Duration Progress to 30 minutes of continuous aerobic without signs/symptoms of physical distress      Intensity   THRR 40-80% of Max Heartrate 62-123    Ratings of Perceived Exertion 11-13    Perceived Dyspnea 0-4      Progression   Progression Continue progressive overload as per policy without signs/symptoms or physical distress.      Resistance Training   Training Prescription Yes    Weight 2    Reps 10-15           Discharge Exercise Prescription (Final Exercise Prescription Changes):  Exercise Prescription Changes - 01/19/21 1630      Response to Exercise   Blood Pressure (Admit) 124/82    Blood Pressure (Exercise) 162/90    Blood Pressure (Exit) 144/80    Heart Rate (Admit) 92 bpm    Heart Rate (Exercise) 123 bpm    Heart Rate (Exit) 99 bpm    Rating of Perceived Exertion (Exercise) 11    Symptoms None    Comments Pt graduated from the [place] program today.    Duration Continue with 30 min of  aerobic exercise without signs/symptoms of physical distress.    Intensity THRR unchanged      Progression   Progression Continue to progress workloads to maintain intensity without signs/symptoms of physical distress.    Average METs 3.1      Resistance Training   Training Prescription Yes    Weight 3 lbs    Reps 10-15    Time 10 Minutes      Interval Training   Interval Training No      NuStep   Level 4    SPM 90    Minutes 30    METs 3.1      Home Exercise Plan   Plans to continue exercise at Home (comment)    Frequency Add 2 additional days to program exercise sessions.    Initial Home Exercises Provided 12/08/20           Functional Capacity:  6 Minute Walk    Row Name 11/23/20 1541 01/17/21 1500       6 Minute Walk   Phase Initial Discharge    Distance 1093 feet 1546 feet    Distance % Change -- 41.45 %    Distance Feet Change -- 453 ft    Walk  Time 6 minutes 6 minutes    # of Rest Breaks 0 0    MPH 2.07 2.93    METS 2.54 3.42    RPE 11 10    Perceived Dyspnea  2 0    VO2 Peak 8.89 11.96    Symptoms No No    Resting HR 90 bpm 95 bpm    Resting BP 118/78 150/80    Resting Oxygen Saturation  99 % 98 %    Exercise Oxygen Saturation  during 6 min walk 98 % 99 %    Max Ex. HR 106 bpm 112 bpm    Max Ex. BP 132/78 148/94    2 Minute Post BP 116/80 --           Psychological, QOL, Others - Outcomes: PHQ 2/9: Depression screen [facility] 2/9 01/17/2021 11/23/2020  Decreased Interest 0 0  Down, Depressed, Hopeless 0 0  PHQ - 2 Score 0 0    Quality of Life:  Quality of Life - 01/15/21 1600      Quality of Life   Select Quality of Life      Quality of Life Scores   Health/Function Post 24.17 %    Socioeconomic Post 25.79 %    Psych/Spiritual Post 24.14 %    Family Post 26.63 %    GLOBAL Post 24.8 %           Personal Goals: Goals established at orientation with interventions provided to work toward goal.  Personal Goals and Risk Factors at  Admission - 11/23/20 1529      Core Components/Risk Factors/Patient Goals on Admission    Weight Management Yes;Obesity;Weight Loss    Intervention Weight Management: Develop a combined nutrition and exercise program designed to reach desired caloric intake, while maintaining appropriate intake of nutrient and fiber, sodium and fats, and appropriate energy expenditure required for the weight goal.;Weight Management: Provide education and appropriate resources to help participant work on and attain dietary goals.;Weight Management/Obesity: Establish reasonable short term and long term weight goals.;Obesity: Provide education and appropriate resources to help participant work on and attain dietary goals.    Admit Weight 194 lb 7.1 oz (88.2 kg)    Expected Outcomes Short Term: Continue to assess and modify interventions until short term weight is achieved;Long Term: Adherence to nutrition and physical activity/exercise program aimed toward attainment of established weight goal;Weight Maintenance: Understanding of the daily nutrition guidelines, which includes 25-35% calories from fat, 7% or less cal from saturated fats, less than 219m cholesterol, less than 1.5gm of sodium, & 5 or more servings of fruits and vegetables daily;Weight Loss: Understanding of general recommendations for a balanced deficit meal plan, which promotes 1-2 lb weight loss per week and includes a negative energy balance of [phone] kcal/d;Understanding recommendations for meals to include 15-35% energy as protein, 25-35% energy from fat, 35-60% energy from carbohydrates, less than 2073mof dietary cholesterol, 20-35 gm of total fiber daily;Understanding of distribution of calorie intake throughout the day with the consumption of 4-5 meals/snacks    Improve shortness of breath with ADL's Yes    Expected Outcomes Short Term: Improve cardiorespiratory fitness to achieve a reduction of symptoms when performing ADLs;Long Term: Be able to  perform more ADLs without symptoms or delay the onset of symptoms    Hypertension Yes    Intervention Provide education on lifestyle modifcations including regular physical activity/exercise, weight management, moderate sodium restriction and increased consumption of fresh fruit, vegetables, and low fat dairy, alcohol moderation,  and smoking cessation.;Monitor prescription use compliance.    Expected Outcomes Short Term: Continued assessment and intervention until BP is < 140/90mm HG in hypertensive participants. < 130/35mm HG in hypertensive participants with diabetes, heart failure or chronic kidney disease.;Long Term: Maintenance of blood pressure at goal levels.    Lipids Yes    Intervention Provide education and support for participant on nutrition & aerobic/resistive exercise along with prescribed medications to achieve LDL '70mg'$ , HDL >$Remo'40mg'UhXjW$ .    Expected Outcomes Short Term: Participant states understanding of desired cholesterol values and is compliant with medications prescribed. Participant is following exercise prescription and nutrition guidelines.;Long Term: Cholesterol controlled with medications as prescribed, with individualized exercise RX and with personalized nutrition plan. Value goals: LDL < $Rem'70mg'lhGx$ , HDL > 40 mg.    Stress Yes    Intervention Offer individual and/or small group education and counseling on adjustment to heart disease, stress management and health-related lifestyle change. Teach and support self-help strategies.    Expected Outcomes Short Term: Participant demonstrates changes in health-related behavior, relaxation and other stress management skills, ability to obtain effective social support, and compliance with psychotropic medications if prescribed.;Long Term: Emotional wellbeing is indicated by absence of clinically significant psychosocial distress or social isolation.            Personal Goals Discharge:  Goals and Risk Factor Review    Row Name 11/28/20 1212  12/05/20 1813 01/03/21 1235 01/17/21 1611       Core Components/Risk Factors/Patient Goals Review   Personal Goals Review Weight Management/Obesity;Diabetes;Lipids;Improve shortness of breath with ADL's;Hypertension Weight Management/Obesity;Diabetes;Lipids;Improve shortness of breath with ADL's;Hypertension Weight Management/Obesity;Diabetes;Lipids;Improve shortness of breath with ADL's;Hypertension Weight Management/Obesity;Diabetes;Lipids;Improve shortness of breath with ADL's;Hypertension    Review Rebecca Farley started exercise on 11/27/20. Rebecca Farley did well with exercise. Vital signs were stable Rebecca Farley is off to a good start to exercise. Vital signs have been stable. Rebecca Farley reports feeling a little less short of breath Rebecca Farley continues to do well with exercise. Vital signs have been stable. Rebecca Farley  continues to reports feeling a little less short of breath and says that she has more energy Rebecca Farley continues to do well with exercise. Vital signs have been stable. Rebecca Farley  continues to reports feeling a little less short of breath and says that she has more energy. Rebecca Farley will complete cardiac rehab on 01/19/21    Expected Outcomes Rebecca Farley will continue to participate in phase 2 cardiac rehab for exercise, nutrition and lifestyle modifications Rebecca Farley will continue to participate in phase 2 cardiac rehab for exercise, nutrition and lifestyle modifications Rebecca Farley will continue to participate in phase 2 cardiac rehab for exercise, nutrition and lifestyle modifications Rebecca Farley will continue to  exercise, follow  nutrition and lifestyle modifications upon completion of phase 2 cardiac rehab           Exercise Goals and Review:  Exercise Goals    Row Name 11/23/20 1531             Exercise Goals   Increase Physical Activity Yes       Intervention Provide advice, education, support and counseling about physical activity/exercise needs.;Develop an individualized exercise prescription for aerobic and resistive training based  on initial evaluation findings, risk stratification, comorbidities and participant's personal goals.       Expected Outcomes Short Term: Attend rehab on a regular basis to increase amount of physical activity.;Long Term: Add in home exercise to make exercise part of routine and to increase amount of physical activity.;Long Term: Exercising regularly at least 3-5  days a week.       Increase Strength and Stamina Yes       Intervention Provide advice, education, support and counseling about physical activity/exercise needs.;Develop an individualized exercise prescription for aerobic and resistive training based on initial evaluation findings, risk stratification, comorbidities and participant's personal goals.       Expected Outcomes Short Term: Increase workloads from initial exercise prescription for resistance, speed, and METs.;Long Term: Improve cardiorespiratory fitness, muscular endurance and strength as measured by increased METs and functional capacity (6MWT);Short Term: Perform resistance training exercises routinely during rehab and add in resistance training at home       Able to understand and use rate of perceived exertion (RPE) scale Yes       Intervention Provide education and explanation on how to use RPE scale       Expected Outcomes Short Term: Able to use RPE daily in rehab to express subjective intensity level;Long Term:  Able to use RPE to guide intensity level when exercising independently       Knowledge and understanding of Target Heart Rate Range (THRR) Yes       Intervention Provide education and explanation of THRR including how the numbers were predicted and where they are located for reference       Expected Outcomes Short Term: Able to state/look up THRR;Long Term: Able to use THRR to govern intensity when exercising independently;Short Term: Able to use daily as guideline for intensity in rehab       Understanding of Exercise Prescription Yes       Intervention Provide  education, explanation, and written materials on patient's individual exercise prescription       Expected Outcomes Short Term: Able to explain program exercise prescription;Long Term: Able to explain home exercise prescription to exercise independently              Exercise Goals Re-Evaluation:  Exercise Goals Re-Evaluation    Row Name 11/27/20 1630 12/08/20 1600 12/25/20 1520 01/19/21 1630       Exercise Goal Re-Evaluation   Exercise Goals Review Increase Physical Activity;Increase Strength and Stamina;Able to understand and use rate of perceived exertion (RPE) scale;Knowledge and understanding of Target Heart Rate Range (THRR);Understanding of Exercise Prescription Increase Physical Activity;Increase Strength and Stamina;Able to understand and use rate of perceived exertion (RPE) scale;Knowledge and understanding of Target Heart Rate Range (THRR);Able to check pulse independently;Understanding of Exercise Prescription Increase Physical Activity;Increase Strength and Stamina;Able to understand and use rate of perceived exertion (RPE) scale;Knowledge and understanding of Target Heart Rate Range (THRR);Able to check pulse independently Increase Physical Activity;Increase Strength and Stamina;Able to understand and use rate of perceived exertion (RPE) scale;Knowledge and understanding of Target Heart Rate Range (THRR);Understanding of Exercise Prescription;Able to check pulse independently    Comments Pt's first day of exercise in the [facility] program. Pt understnads the THRR, RPE scale, and Exercise Rx. Reviewed METs and home exercise Rx. Pt walks at work and home. Encouraged 30 minutes of walking 2-3x/week. Pt making progress in the program. Reviewed Goals/mets. Pt is walking on off days and is feeling stronger. Pt graduated from the [place] program today. Pt made good progress and had an average MET level of 3.1. Pt will continue her exercise at home by walking or riding her stationary bike.    Expected  Outcomes Will continue to monitor patients and progress exercise workloads as tolerated. Will continue to monitor patient and encouraged waliking at home. Will continue to monitor patient and encouraged waliking  at home. Pt will continue to exercise at home on her own.           Nutrition & Weight - Outcomes:  Pre Biometrics - 11/23/20 1415      Pre Biometrics   Waist Circumference 41.5 inches    Hip Circumference 46.5 inches    Waist to Hip Ratio 0.89 %    Triceps Skinfold 47 mm    % Body Fat 46.1 %    Grip Strength 29 kg    Flexibility 15.75 in    Single Leg Stand 1 seconds   High fall risk          Post Biometrics - 01/17/21 1615       Post  Biometrics   Height 5' 5.25" (1.657 m)    Weight 90.6 kg    Waist Circumference 41.25 inches    Hip Circumference 45.5 inches    Waist to Hip Ratio 0.91 %    BMI (Calculated) 33    Triceps Skinfold 46 mm    % Body Fat 46.4 %    Grip Strength 31 kg    Flexibility 17.25 in    Single Leg Stand 3.68 seconds           Nutrition:  Nutrition Therapy & Goals - 12/11/20 1009      Nutrition Therapy   Diet TLC      Personal Nutrition Goals   Nutrition Goal Pt to identify food quantities necessary to achieve weight loss of 6-24 lb at graduation from cardiac rehab.    Personal Goal #2 Pt to choose healthier restaurants    Personal Goal #3 Pt to build a healthy plate including vegetables, fruits, whole grains, and low-fat dairy products in a heart healthy meal plan.      Intervention Plan   Intervention Prescribe, educate and counsel regarding individualized specific dietary modifications aiming towards targeted core components such as weight, hypertension, lipid management, diabetes, heart failure and other comorbidities.;Nutrition handout(s) given to patient.    Expected Outcomes Short Term Goal: A plan has been developed with personal nutrition goals set during dietitian appointment.;Long Term Goal: Adherence to prescribed nutrition  plan.           Nutrition Discharge:   Education Questionnaire Score:  Knowledge Questionnaire Score - 01/15/21 1600      Knowledge Questionnaire Score   Post Score 22/24           Goals reviewed with patient; copy given to patient.Pt graduated from cardiac rehab program today with completion of 36 exercise sessions in Phase II. Pt maintained good attendance and progressed nicely during her participation in rehab as evidenced by increased MET level.   Medication list reconciled. Repeat  PHQ score-0  .  Pt has made significant lifestyle changes and should be commended for her success. Pt feels she has achieved her goals during cardiac rehab.   Pt plans to continue exercise by walking 5-7 days a week. Virdia says that she feels stronger. Josefine also says she plans to use her exercise bike again as she has not been using it. Vance increased her distance on her post exercise walk test by 435 feet. We are proud of Katrianna's progress!Barnet Pall, RN,BSN 01/31/2021 10:41 AM

## 2021-01-19 ENCOUNTER — Other Ambulatory Visit: Payer: Self-pay

## 2021-01-19 ENCOUNTER — Encounter (HOSPITAL_COMMUNITY)
Admission: RE | Admit: 2021-01-19 | Discharge: 2021-01-19 | Disposition: A | Discharge status: Home / Self Care | Payer: Medicare Other | Source: Ambulatory Visit | Attending: Interventional Cardiology | Admitting: Interventional Cardiology

## 2021-01-19 VITALS — Ht 65.25 in | Wt 199.7 lb

## 2021-01-19 DIAGNOSIS — Z952 Presence of prosthetic heart valve: Secondary | ICD-10-CM | POA: Diagnosis not present

## 2021-03-01 ENCOUNTER — Encounter: Payer: Self-pay | Admitting: *Deleted

## 2021-03-02 ENCOUNTER — Other Ambulatory Visit: Payer: Self-pay | Admitting: Internal Medicine

## 2021-03-02 DIAGNOSIS — Z1231 Encounter for screening mammogram for malignant neoplasm of breast: Secondary | ICD-10-CM

## 2021-03-07 NOTE — Telephone Encounter (Signed)
Pt reported in other Mychart message that Nurtec works sometimes for her. I completed a renewal PA on Cover My Meds. Key: [id number]. Awaiting determination from [facility] Medicare Part D.

## 2021-03-08 ENCOUNTER — Other Ambulatory Visit: Payer: Self-pay

## 2021-03-08 ENCOUNTER — Ambulatory Visit
Admission: RE | Admit: 2021-03-08 | Discharge: 2021-03-08 | Disposition: A | Discharge status: Home / Self Care | Payer: Medicare Other | Source: Ambulatory Visit | Attending: Internal Medicine | Admitting: Internal Medicine

## 2021-03-08 ENCOUNTER — Telehealth: Payer: Self-pay | Admitting: Neurology

## 2021-03-08 DIAGNOSIS — Z1231 Encounter for screening mammogram for malignant neoplasm of breast: Secondary | ICD-10-CM

## 2021-03-08 NOTE — Telephone Encounter (Signed)
We also received a fax from [facility] with this additional question.  I have inserted on the form and faxed back to [facility]. Received a receipt of confirmation. [phone].

## 2021-03-08 NOTE — Telephone Encounter (Signed)
[employer] [id number]) called, have question on PA for Nurtec need clarification. Would like a call from the nurse  Contact Info: [phone] option 5

## 2021-03-12 NOTE — Telephone Encounter (Signed)
Cover My Meds states the PA has been denied but no information has been provided as to why.

## 2021-03-21 NOTE — Telephone Encounter (Signed)
The denial letter from [facility] states patient has to have either failed or cannot take a triptan such as Rizatriptan, Sumatriptan. If we choose to appeal, fax to [phone].   Dr Lucia Gaskins stated she is happy to prescribe a triptan for the patient if she would like. I have reached out to the patient via mychart.

## 2021-03-26 ENCOUNTER — Telehealth: Payer: Self-pay | Admitting: Interventional Cardiology

## 2021-03-26 DIAGNOSIS — R42 Dizziness and giddiness: Secondary | ICD-10-CM

## 2021-03-26 NOTE — Telephone Encounter (Signed)
Spoke with the patient who states that she has been having increased frequency of her dizzy spells. She states that she has not had any syncopal episodes but a couple times she felt like she might. The other day she was at work and had to lean onto the counter because she was concerned she was going to pass out. She denies any other symptoms other than some continued slight SOB with no change. Patient states that dizziness occurs with exertion more often. She states that her BP has been fine. Gets as high as 130s/90s at times but does not drop low ever. She states HR has been good. It is not dropping low and she is also not having any heart racing or palpitations. Advised patient to make sure she is staying hydrated and changing positions slowly. Will send to Dr. Abe People for further advisement.

## 2021-03-26 NOTE — Telephone Encounter (Signed)
STAT if patient feels like he/she is going to faint   Are you dizzy now? no  Do you feel faint or have you passed out? no  Do you have any other symptoms? Gets winded with exertion, believes related to recover from heart surgery in October  Have you checked your HR and BP (record if available)? Normally ranges 120's/ 70's, a few times has been a little high around 130's/90's   Patient states she has been having increased dizzy spells over the last month. She states she has also been getting winded, but thinks it may just be recover from her heart surgery last October. She was not SOB on the phone. She states a couple times her BP has been a little higher.

## 2021-03-27 ENCOUNTER — Ambulatory Visit (INDEPENDENT_AMBULATORY_CARE_PROVIDER_SITE_OTHER): Payer: Medicare Other

## 2021-03-27 DIAGNOSIS — R42 Dizziness and giddiness: Secondary | ICD-10-CM

## 2021-03-27 NOTE — Progress Notes (Unsigned)
Patient enrolled for [employer] to mail a 14 day ZIO XT to address on file.  

## 2021-03-27 NOTE — Telephone Encounter (Signed)
Most recent echo showed normal LV function and aortic valve function.  Agree with staying hydrated.  Plan for 2 week Zio patch due to dizziness.     JV   Patient notified.  She is aware monitor will be mailed to her.  Instructions sent through my chart.

## 2021-04-07 DIAGNOSIS — R42 Dizziness and giddiness: Secondary | ICD-10-CM

## 2021-04-25 ENCOUNTER — Telehealth: Payer: Self-pay | Admitting: Neurology

## 2021-04-25 DIAGNOSIS — G43711 Chronic migraine without aura, intractable, with status migrainosus: Secondary | ICD-10-CM

## 2021-04-25 DIAGNOSIS — R519 Headache, unspecified: Secondary | ICD-10-CM

## 2021-04-25 DIAGNOSIS — R5383 Other fatigue: Secondary | ICD-10-CM

## 2021-04-25 MED ORDER — NURTEC 75 MG PO TBDP: 75.0000 mg | ORAL_TABLET | Freq: Every day | ORAL | 0 refills | Status: DC | PRN

## 2021-04-25 NOTE — Telephone Encounter (Signed)
I did not any earlier time to give her.  I relayed that since she has appt 05-30-21 will refill for one month and  she needs to keep the appt set.  She states she has gotten it from insurance previously.  Done.

## 2021-04-25 NOTE — Telephone Encounter (Signed)
Pt has called asking if she can be seen earlier due to her not being able to get her medication before she is seen.  Pt took an earlier appointment and is on wait list, pt asking to be called if there are Nurtec samples available.

## 2021-04-25 NOTE — Addendum Note (Signed)
Addended by: Guy Begin on: 04/25/2021 03:02 PM   Modules accepted: Orders

## 2021-04-26 ENCOUNTER — Other Ambulatory Visit (HOSPITAL_COMMUNITY): Payer: Self-pay

## 2021-04-26 MED ORDER — OMEPRAZOLE 40 MG PO CPDR
DELAYED_RELEASE_CAPSULE | ORAL | 1 refills | Status: DC
Start: 2021-04-26 — End: 2021-09-24
  Filled 2021-04-26: qty 60, 30d supply, fill #0

## 2021-05-03 ENCOUNTER — Telehealth: Payer: Self-pay | Admitting: Interventional Cardiology

## 2021-05-03 NOTE — Telephone Encounter (Signed)
   Primary Cardiologist: Lance Muss, MD  Chart reviewed as part of pre-operative protocol coverage. Simple dental extractions are considered low risk procedures per guidelines and generally do not require any specific cardiac clearance. It is also generally accepted that for simple extractions and dental cleanings, there is no need to interrupt blood thinner therapy.   SBE prophylaxis is required for the patient.  I will route this recommendation to the requesting party via Epic fax function and remove from pre-op pool.  Please call with questions.  Ronney Asters, NP 05/03/2021, 11:09 AM

## 2021-05-03 NOTE — Telephone Encounter (Signed)
   [facility] Pre-operative Risk Assessment    Patient Name: Rebecca Farley  DOB: 1956/06/04 MRN: [id number]  HEARTCARE STAFF:  - IMPORTANT!!!!!! Under Visit Info/Reason for Call, type in Other and utilize the format Clearance MM/DD/YY or Clearance TBD. Do not use dashes or single digits. - Please review there is not already an duplicate clearance open for this procedure. - If request is for dental extraction, please clarify the # of teeth to be extracted. - If the patient is currently at the dentist's office, call Pre-Op Callback Staff (MA/nurse) to input urgent request.  - If the patient is not currently in the dentist office, please route to the Pre-Op pool.  Request for surgical clearance:  What type of surgery is being performed? Cleaning and periodotal screening  When is this surgery scheduled? 05/09/2021  What type of clearance is required (medical clearance vs. Pharmacy clearance to hold med vs. Both)? Both   Are there any medications that need to be held prior to surgery and how long? Depends on Dr. Irish Lack  Practice name and name of physician performing surgery? [facility]; Dr. Robb Matar  What is the office phone number? [phone]   7.   What is the office fax number? [phone]  8.   Anesthesia type (None, local, MAC, general) ? None    Durel Salts 05/03/2021, 10:58 AM  _________________________________________________________________   (provider comments below)

## 2021-05-04 ENCOUNTER — Other Ambulatory Visit (HOSPITAL_COMMUNITY): Payer: Self-pay

## 2021-05-14 NOTE — Telephone Encounter (Signed)
Pt has called back to report that since 7-20 she has been waiting to be able to get the Nurtec, pt is being told there is some type of delay due to PA ,please call pt.

## 2021-05-14 NOTE — Telephone Encounter (Signed)
I called pt and asked if she had taken triptan in past. She stated she has taken imitrex, maxalt, baclofen, and relpax.   Her insurance in [facility] part D.  Initiated (Key: [id number]) Nurtec 75mg  tablets #8.

## 2021-05-15 NOTE — Telephone Encounter (Signed)
Received fax from [facility]. Nurtec approved through 05/14/22. Faxed approval to pharmacy. Received a receipt of confirmation.

## 2021-05-30 ENCOUNTER — Encounter: Payer: Self-pay | Admitting: Neurology

## 2021-05-30 ENCOUNTER — Telehealth: Payer: Self-pay | Admitting: *Deleted

## 2021-05-30 ENCOUNTER — Ambulatory Visit (INDEPENDENT_AMBULATORY_CARE_PROVIDER_SITE_OTHER): Payer: Medicare Other | Admitting: Neurology

## 2021-05-30 VITALS — BP 169/99 | HR 76 | Ht 65.0 in | Wt 204.4 lb

## 2021-05-30 DIAGNOSIS — G43711 Chronic migraine without aura, intractable, with status migrainosus: Secondary | ICD-10-CM | POA: Diagnosis not present

## 2021-05-30 DIAGNOSIS — R519 Headache, unspecified: Secondary | ICD-10-CM

## 2021-05-30 DIAGNOSIS — G43909 Migraine, unspecified, not intractable, without status migrainosus: Secondary | ICD-10-CM | POA: Diagnosis not present

## 2021-05-30 DIAGNOSIS — R5383 Other fatigue: Secondary | ICD-10-CM | POA: Diagnosis not present

## 2021-05-30 MED ORDER — ZEMBRACE SYMTOUCH 3 MG/0.5ML ~~LOC~~ SOAJ
3.0000 mg | Freq: Once | SUBCUTANEOUS | 11 refills | Status: DC | PRN
Start: 2021-05-30 — End: 2022-09-10

## 2021-05-30 MED ORDER — TOSYMRA 10 MG/ACT NA SOLN: 1.0000 | NASAL | 6 refills | Status: DC | PRN

## 2021-05-30 MED ORDER — UBRELVY 100 MG PO TABS: 100.0000 mg | ORAL_TABLET | ORAL | 11 refills | Status: DC | PRN

## 2021-05-30 MED ORDER — EMGALITY 120 MG/ML ~~LOC~~ SOAJ: 120.0000 mg | SUBCUTANEOUS | 11 refills | Status: DC

## 2021-05-30 NOTE — Telephone Encounter (Signed)
TREZURE CRONK (Key: [id number]) Rx #: [id number] Bernita Raisin 100MG  tablet  Sent PA for [employer] waiting on approval

## 2021-05-30 NOTE — Progress Notes (Signed)
[id number]    Provider:  Dr Jaynee Eagles Requesting Provider: Lavone Orn, MD Primary Care Provider:  Lavone Orn, MD  CC:  Worsening migraines  Follow up 05/30/2021; sHE WAS STARTED ON eMGALITY AND NURTEC ABOUT A YEAR AGO. Interval history 05/30/2021: She is doing great on emgality. As preventative. The problem is acutely, waking up with a headache, when it is full blown. Nurtec works in a few hours IF the headache isn;t on waking up. So will try ubrelvy. Tosymra. And next go to injections of imitrex.  Try Roselyn Meier acutely Try Tosymra acutely Can also try imitrex injection (but not at the same time as a tosymra spray)  HPI:  Rebecca Farley is a 65 y.o. female here as requested by Lavone Orn, MD for migraines. I reviewed Dr. Delene Ruffini notes: She has a past medical history of degenerative joint disease of the hip, osteopenia, severe aortic stenosis, migraine headache, degenerative joint disease of the knee, a flutter, hypertension, GERD, obesity and other chronic pain.  It appears at last appointment she was treated with imipramine 25 mg and baclofen 10 mg possibly for management of migraine headaches, also on valsartan hydrochlorothiazide for blood pressure however this can be used for migraine management, she follows with cardiology for severe aortic stenosis, imipramine for prevention with headaches but requested neurology again (not sure where she had been to prior.  She reported to Dr. Laurann Montana that she had more headaches recently, she moved into a new home in the setting of stress, sometimes migraines lasting 2 to 3 days, taking baclofen for acute headaches and imipramine preventively, tries to stay healthy, she is exercising regularly, not been a good sleeper for years, she saw a headache wellness center once in the past but did not go back.  I also reviewed Dr. Delene Ruffini labs which were collected November 04, 2019 which showed a normal BMP with BUN 12 and creatinine 0.76,  unremarkable CBC with differential,   She is here with worsening migraines. She snores and wakes herself up. She has morning headaches, recent fatigue. Diagnosed with migraines 36 years ago. After she had her daughter. Migraines unilateral used to be always behind the right eye, pulsating, pounding, throbbing, severe, now it is behind the left eye sometimes, then it may radiate, the headaches in the morning are less migrainous more holocephalic. +light/sound sensitivity, nausea, movement makes it worse, the headache is positional and exertional, daily, worsening, continuous all day long, can be severe to the point she can vomit, ongoing and worsening the last 6 months, they can last 3 days. No other focal neurologic deficits, associated symptoms, inciting events or modifiable factors.  Reviewed notes, labs and imaging from outside physicians, which showed: see above  Review of Systems: Patient complains of symptoms per HPI as well as the following symptoms: stress . Pertinent negatives and positives per HPI. All others negative    Social History   Socioeconomic History   Marital status: Widowed    Spouse name: Not on file   Number of children: 3   Years of education: 12   Highest education level: High school graduate  Occupational History   Not on file  Tobacco Use   Smoking status: Never   Smokeless tobacco: Never  Vaping Use   Vaping Use: Never used  Substance and Sexual Activity   Alcohol use: Yes    Comment: OCCASIONALY   Drug use: No   Sexual activity: Not Currently    Birth control/protection: Surgical  Other Topics Concern  Not on file  Social History Narrative   Not on file   Social Determinants of Health   Financial Resource Strain: Not on file  Food Insecurity: Not on file  Transportation Needs: Not on file  Physical Activity: Not on file  Stress: Not on file  Social Connections: Not on file  Intimate Partner Violence: Not on file    Family History   Problem Relation Age of Onset   Hypertension Mother    Heart failure Mother    Aneurysm Mother    Kidney failure Mother    Emphysema Father    Prostate cancer Father    Lung cancer Father    Colon cancer Sister 61       dx 3 times; Lynch syndrome (PMS2)   Melanoma Sister 76   Other Sister        genetic testing - PMS2+, BRIP1+, MITF+   Melanoma Sister    Other Sister        genetic testing - PMS2+   Idiopathic pulmonary fibrosis Brother    Lung cancer Maternal Uncle    Liver cancer Maternal Uncle    Lung cancer Paternal Uncle    Ovarian cancer Paternal Grandmother        died early 39s   Other Sister        genetic testing - BRIP1+, PMS2-, MITF-   Other Sister        genetic testing - BRIP1+, MITF+, PMS2-   Colon cancer Brother 65    Past Medical History:  Diagnosis Date   Atrial flutter ([place]) 01/2008   RADIOFREQUENCY ABLATION [place]   Barrett esophagus    Bee sting allergy    Diverticulitis    DJD (degenerative joint disease) of knee    KNEES, BACK (LUMBAR FACET DISEASE) AND HIPS, DALLDORF.   GERD (gastroesophageal reflux disease)    Hiatal hernia    History of syncope    RECURRENT   Hyperlipidemia    BORDERLINE   Hypertension    Migraines    Obesity    Osteopenia    S/P RF ablation operation for arrhythmia    Severe aortic stenosis    Shoulder tendinitis, left 2008    Patient Active Problem List   Diagnosis Date Noted   S/P AVR (aortic valve replacement) 07/27/2020   Severe aortic stenosis 07/07/2020   Hypertension    Hyperlipidemia    History of syncope    GERD (gastroesophageal reflux disease)    Barrett esophagus    S/P RF ablation operation for arrhythmia    Chronic migraine without aura, with intractable migraine, so stated, with status migrainosus 06/18/2020   Primary localized osteoarthritis of right hip 11/23/2019   Primary osteoarthritis of right hip 11/23/2019   Genetic testing 28/36/6294   Monoallelic mutation of BRIP1 gene  06/01/2019   Family history of colon cancer    Family history of melanoma    Family history of ovarian cancer    Family history of lung cancer    Family history of prostate cancer    Family history of liver cancer    Family history of carrier of genetic disease    Atrial flutter ([place]) 01/2008    Past Surgical History:  Procedure Laterality Date   ABDOMINAL HYSTERECTOMY     AORTIC VALVE REPLACEMENT N/A 07/27/2020   Procedure: AORTIC VALVE REPLACEMENT (AVR) USING 23 MM INSPIRIS RESILIA  AORTIC VALVE;  Surgeon: Gaye Pollack, MD;  Location: [facility];  Service: Open Heart  Surgery;  Laterality: N/A;   BREAST BIOPSY Left    10 yrs +   [place]     COLONOSCOPY  11/03/2012   Procedure: COLONOSCOPY;  Surgeon: Garlan Fair, MD;  Location: [facility];  Service: Endoscopy;  Laterality: N/A;   ESOPHAGOGASTRODUODENOSCOPY  11/03/2012   Procedure: ESOPHAGOGASTRODUODENOSCOPY (EGD);  Surgeon: Garlan Fair, MD;  Location: [facility];  Service: Endoscopy;  Laterality: N/A;   implanted loop device     RIGHT HEART CATH AND CORONARY ANGIOGRAPHY N/A 07/07/2020   Procedure: RIGHT HEART CATH AND CORONARY ANGIOGRAPHY;  Surgeon: Sherren Mocha, MD;  Location: [facility];  Service: Cardiovascular;  Laterality: N/A;   TEE WITHOUT CARDIOVERSION N/A 07/27/2020   Procedure: TRANSESOPHAGEAL ECHOCARDIOGRAM (TEE);  Surgeon: Gaye Pollack, MD;  Location: [facility];  Service: Open Heart Surgery;  Laterality: N/A;   TOTAL HIP ARTHROPLASTY Right 11/23/2019   Procedure: RIGHT TOTAL HIP ARTHROPLASTY ANTERIOR APPROACH;  Surgeon: Melrose Nakayama, MD;  Location: [facility];  Service: Orthopedics;  Laterality: Right;    Current Outpatient Medications  Medication Sig Dispense Refill   acetaminophen (TYLENOL) 500 MG tablet Take 500-1,000 mg by mouth every 6 (six) hours as needed (for pain.).     aspirin EC 325 MG EC tablet Take 1 tablet (325 mg  total) by mouth daily. 30 tablet 0   b complex vitamins tablet Take 1 tablet by mouth daily.     Calcium Carb-Cholecalciferol (CALCIUM+D3 PO) Take 1 tablet by mouth daily.     doxycycline (VIBRAMYCIN) 50 MG capsule Take 2 capsules 1 hour prior to any dental procedures 2 capsule 3   EPINEPHrine 0.3 mg/0.3 mL IJ SOAJ injection Inject 0.3 mg into the muscle as needed for anaphylaxis.     folic acid (FOLVITE) 1 MG tablet Take 1 mg by mouth daily.     Ginkgo Biloba 120 MG TABS Take 120 mg by mouth daily.     Glucosamine-Chondroitin (COSAMIN DS PO) Take 1-2 tablets by mouth See admin instructions. Take 2 tablets by mouth in the morning & take 1 tablet by mouth at night.     Multiple Vitamin (MULTIVITAMIN WITH MINERALS) TABS tablet Take 1 tablet by mouth daily.     Omega-3 1000 MG CAPS Take 2,000 mg by mouth daily.     omeprazole (PRILOSEC) 20 MG capsule Take 20 mg by mouth daily before breakfast.     omeprazole (PRILOSEC) 40 MG capsule Take 1 capsule by mouth twice daily 30 minutes before a meal. 60 capsule 1   Polyethyl Glycol-Propyl Glycol (LUBRICANT EYE DROPS) 0.4-0.3 % SOLN Place 1 drop into both eyes 3 (three) times daily as needed (dry/irritated eyes.).     Rimegepant Sulfate (NURTEC) 75 MG TBDP Take 75 mg by mouth daily as needed. For migraines. Take as close to onset of migraine as possible. One daily maximum. 8 tablet 0   SUMAtriptan (TOSYMRA) 10 MG/ACT SOLN Place 1 spray into the nose every hour as needed. Max 4x a day. 12 each 6   SUMAtriptan Succinate (ZEMBRACE SYMTOUCH) 3 MG/0.5ML SOAJ Inject 3 mg into the skin once as needed for up to 1 dose. May repeat in 15 minutes. If symptoms persist, repeat in 2 hours. Max 4 injections daily. 0.5 mL 11   Turmeric 500 MG TABS Take 1,000 mg by mouth daily.     Ubrogepant (UBRELVY) 100 MG TABS Take 100 mg by mouth  every 2 (two) hours as needed. Maximum 263m a day. 16 tablet 11   valsartan (DIOVAN) 160 MG tablet Take 1 tablet (160 mg total) by mouth  daily. 90 tablet 3   Galcanezumab-gnlm (EMGALITY) 120 MG/ML SOAJ Inject 120 mg into the skin every 30 (thirty) days. 1.12 mL 11   No current facility-administered medications for this visit.    Allergies as of 05/30/2021 - Review Complete 05/30/2021  Allergen Reaction Noted   Mobic [meloxicam] Shortness Of Breath 06/16/2019   Penicillins Anaphylaxis, Hives, and Other (See Comments) 09/05/2011   Codeine  06/16/2019   Nsaids Itching and Nausea And Vomiting 09/05/2011    Vitals: BP (!) 169/99   Pulse 76   Ht _0  (1.651 m)   Wt 204 lb 6.4 oz (92.7 kg)   BMI 34.01 kg/m  Last Weight:  Wt Readings from Last 1 Encounters:  05/30/21 204 lb 6.4 oz (92.7 kg)   Last Height:   Ht Readings from Last 1 Encounters:  05/30/21 _1  (1.651 m)    Exam: NAD, pleasant                  Speech:    Speech is normal; fluent and spontaneous with normal comprehension.  Cognition:    The patient is oriented to person, place, and time;     recent and remote memory intact;     language fluent;    Cranial Nerves:    The pupils are equal, round, and reactive to light.Trigeminal sensation is intact and the muscles of mastication are normal. The face is symmetric. The palate elevates in the midline. Hearing intact. Voice is normal. Shoulder shrug is normal. The tongue has normal motion without fasciculations.   Coordination:  No dysmetria  Motor Observation:    No asymmetry, no atrophy, and no involuntary movements noted. Tone:    Normal muscle tone.     Strength:    Strength is V/V in the upper and lower limbs.      Sensation: intact to LT     Assessment/Plan:  Really lovely 65year old with worsening headaches likely progression of chronic migraines however needs thorough evaluation.  She is doing great on emgality. As preventative. The problem is acutely, waking up with a headache, when it is full blown. Nurtec works in a few hours IF the headache isn;t on waking up. So will try ubrelvy.  Tosymra. And next go to injections of imitrex.  Try URoselyn Meieracutely Try Tosymra acutely Can also try zembrace  She snores and wakes herself up. Improved. She has morning headaches, recent fatigue. She declined a sleep test  MRI of the brain w/wo contrast: she declines at this time, ordered it last year and she is better so will just monitor  No orders of the defined types were placed in this encounter.  Meds ordered this encounter  Medications   Ubrogepant (UBRELVY) 100 MG TABS    Sig: Take 100 mg by mouth every 2 (two) hours as needed. Maximum 2047ma day.    Dispense:  16 tablet    Refill:  11   SUMAtriptan (TOSYMRA) 10 MG/ACT SOLN    Sig: Place 1 spray into the nose every hour as needed. Max 4x a day.    Dispense:  12 each    Refill:  6   SUMAtriptan Succinate (ZEMBRACE SYMTOUCH) 3 MG/0.5ML SOAJ    Sig: Inject 3 mg into the skin once as needed for up to 1 dose. May repeat  in 15 minutes. If symptoms persist, repeat in 2 hours. Max 4 injections daily.    Dispense:  0.5 mL    Refill:  11   Galcanezumab-gnlm (EMGALITY) 120 MG/ML SOAJ    Sig: Inject 120 mg into the skin every 30 (thirty) days.    Dispense:  1.12 mL    Refill:  11     Discussed: To prevent or relieve headaches, try the following: Cool Compress. Lie down and place a cool compress on your head.  Avoid headache triggers. If certain foods or odors seem to have triggered your migraines in the past, avoid them. A headache diary might help you identify triggers.  Include physical activity in your daily routine. Try a daily walk or other moderate aerobic exercise.  Manage stress. Find healthy ways to cope with the stressors, such as delegating tasks on your to-do list.  Practice relaxation techniques. Try deep breathing, yoga, massage and visualization.  Eat regularly. Eating regularly scheduled meals and maintaining a healthy diet might help prevent headaches. Also, drink plenty of fluids.  Follow a regular sleep  schedule. Sleep deprivation might contribute to headaches Consider biofeedback. With this mind-body technique, you learn to control certain bodily functions -- such as muscle tension, heart rate and blood pressure -- to prevent headaches or reduce headache pain.    Proceed to emergency room if you experience new or worsening symptoms or symptoms do not resolve, if you have new neurologic symptoms or if headache is severe, or for any concerning symptom.   Provided education and documentation from [employer] toolbox including articles on: chronic migraine medication overuse headache, chronic migraines, prevention of migraines, behavioral and other nonpharmacologic treatments for headache.   Cc: Lavone Orn, MD,  Lavone Orn, MD  Sarina Ill, MD  [facility] [address] [place], [place] [address]  I spent 30 minutes of face-to-face and non-face-to-face time with patient on the  1. Chronic migraine without aura, with intractable migraine, so stated, with status migrainosus   2. Acute confusional migraine   3. Morning headache   4. Other fatigue    diagnosis.  This included previsit chart review, lab review, study review, order entry, electronic health record documentation, patient education on the different diagnostic and therapeutic options, counseling and coordination of care, risks and benefits of management, compliance, or risk factor reduction  Phone [phone] Fax [phone]  I spent more than 89mnutes of face-to-face and non-face-to-face time with patient on the  1. Acute confusional migraine   2. Morning headache   3. Chronic migraine without aura, with intractable migraine, so stated, with status migrainosus   4. Other fatigue     diagnosis.  This included previsit chart review, lab review, study review, order entry, electronic health record documentation, patient education on the different diagnostic and therapeutic  options, counseling and coordination of care, risks and benefits of management, compliance, or risk factor reduction

## 2021-05-30 NOTE — Patient Instructions (Signed)
Try Bernita RaisinUbrelvy acutely Try Tosymra acutely Can also try imitrex injection (but not at the same time as a tosymra spray)  Ubrogepant tablets What is this medication? UBROGEPANT (ue BROE je pant) is used to treat migraine headaches with or without aura. An aura is a strange feeling or visual disturbance that warns you of an attack. It is not used to prevent migraines. This medicine may be used for other purposes; ask your health care provider or pharmacist if you have questions. COMMON BRAND NAME(S): Bernita RaisinUbrelvy What should I tell my care team before I take this medication? They need to know if you have any of these conditions: kidney disease liver disease an unusual or allergic reaction to ubrogepant, other medicines, foods, dyes, or preservatives pregnant or trying to get pregnant breast-feeding How should I use this medication? Take this medicine by mouth with a glass of water. Follow the directions on the prescription label. You can take it with or without food. If it upsets your stomach, take it with food. Take your medicine at regular intervals. Do not take it more often than directed. Do not stop taking except on your doctor's advice. Talk to your pediatrician about the use of this medicine in children. Special care may be needed. Overdosage: If you think you have taken too much of this medicine contact a poison control center or emergency room at once. NOTE: This medicine is only for you. Do not share this medicine with others. What if I miss a dose? This does not apply. This medicine is not for regular use. What may interact with this medication? Do not take this medicine with any of the following medicines: ceritinib certain antibiotics like chloramphenicol, clarithromycin, telithromycin certain antivirals for HIV like atazanavir, cobicistat, darunavir, delavirdine, fosamprenavir, indinavir, ritonavir certain medicines for fungal infections like itraconazole, ketoconazole, posaconazole,  voriconazole conivaptan grapefruit idelalisib mifepristone nefazodone ribociclib This medicine may also interact with the following medications: carvedilol certain medicines for seizures like phenobarbital, phenytoin ciprofloxacin cyclosporine eltrombopag fluconazole fluvoxamine quinidine rifampin St. John's wort verapamil This list may not describe all possible interactions. Give your health care provider a list of all the medicines, herbs, non-prescription drugs, or dietary supplements you use. Also tell them if you smoke, drink alcohol, or use illegal drugs. Some items may interact with your medicine. What should I watch for while using this medication? Visit your health care professional for regular checks on your progress. Tell your health care professional if your symptoms do not start to get better or if they get worse. Your mouth may get dry. Chewing sugarless gum or sucking hard candy and drinking plenty of water may help. Contact your health care professional if the problem does not go away or is severe. What side effects may I notice from receiving this medication? Side effects that you should report to your doctor or health care professional as soon as possible: allergic reactions like skin rash, itching or hives; swelling of the face, lips, or tongue Side effects that usually do not require medical attention (report these to your doctor or health care professional if they continue or are bothersome): drowsiness dry mouth nausea tiredness This list may not describe all possible side effects. Call your doctor for medical advice about side effects. You may report side effects to FDA at 1-800-FDA-1088. Where should I keep my medication? Keep out of the reach of children. Store at room temperature between 15 and 30 degrees C (59 and 86 degrees F). Throw away any unused medicine after  the expiration date. NOTE: This sheet is a summary. It may not cover all possible  information. If you have questions about this medicine, talk to your doctor, pharmacist, or health care provider.  2022 Elsevier/Gold Standard (2018-12-10 08:50:55) Sumatriptan Injection What is this medication? SUMATRIPTAN (soo ma TRIP tan) treats migraines and cluster headaches. It works by blocking pain signals and narrowing blood vessels in the brain. It belongs to a group of medications called triptans. It is not used to prevent headachesor migraines. This medicine may be used for other purposes; ask your health care provider orpharmacist if you have questions. COMMON BRAND NAME(S): Alsuma, Imitrex, Imitrex STAT dose, Sumavel DoseProSystem, ZEMBRACE What should I tell my care team before I take this medication? They need to know if you have any of these conditions: Cigarette smoker Circulation problems in fingers and toes Diabetes Heart disease High blood pressure High cholesterol History of irregular heartbeat History of stroke Kidney disease Liver disease Stomach or intestine problems An unusual or allergic reaction to sumatriptan, latex, other medications, foods, dyes, or preservatives Pregnant or trying to get pregnant Breast-feeding How should I use this medication? This medication is for injection under the skin. You will be taught how to prepare and give this medication. Use exactly as directed. Do not take yourmedication more often than directed. Talk to your care team regarding the use of this medication in children.Special care may be needed. Overdosage: If you think you have taken too much of this medicine contact apoison control center or emergency room at once. NOTE: This medicine is only for you. Do not share this medicine with others. What if I miss a dose? This does not apply. This medication is not for regular use. What may interact with this medication? Do not take this medication with any of the following: Certain medications for migraine headache like  almotriptan, eletriptan, frovatriptan, naratriptan, rizatriptan, sumatriptan, zolmitriptan Ergot alkaloids like dihydroergotamine, ergonovine, ergotamine, methylergonovine MAOIs like Carbex, Eldepryl, Marplan, Nardil, and Parnate This medication may also interact with the following: Certain medications for depression, anxiety, or psychotic disorders This list may not describe all possible interactions. Give your health care provider a list of all the medicines, herbs, non-prescription drugs, or dietary supplements you use. Also tell them if you smoke, drink alcohol, or use illegaldrugs. Some items may interact with your medicine. What should I watch for while using this medication? Visit your care team for regular checks on your progress. Tell your care teamif your symptoms do not start to get better or if they get worse. You may get drowsy or dizzy. Do not drive, use machinery, or do anything that needs mental alertness until you know how this medication affects you. Do not stand up or sit up quickly, especially if you are an older patient. This reduces the risk of dizzy or fainting spells. Alcohol may interfere with theeffect of this medication. Tell your care team right away if you have any change in your eyesight. If you take migraine medications for 10 or more days a month, your migraines may get worse. Keep a diary of headache days and medication use. Contact yourcare team if your migraine attacks occur more frequently. What side effects may I notice from receiving this medication? Side effects that you should report to your care team as soon as possible: Allergic reactions-skin rash, itching, hives, swelling of the face, lips, tongue, or throat Burning, pain, tingling, or color changes in the legs or feet Heart attack-pain or tightness in the chest,  shoulders, arms, or jaw, nausea, shortness of breath, cold or clammy skin, feeling faint or lightheaded Heart rhythm changes-fast or irregular  heartbeat, dizziness, feeling faint or lightheaded, chest pain, trouble breathing Increase in blood pressure Raynaud's-cool, numb, or painful fingers or toes that may change color from pale, to blue, to red Seizures Serotonin syndrome-irritability, confusion, fast or irregular heartbeat, muscle stiffness, twitching muscles, sweating, high fever, seizure, chills, vomiting, diarrhea Stroke-sudden numbness or weakness of the face, arm, or leg, trouble speaking, confusion, trouble walking, loss of balance or coordination, dizziness, severe headache, change in vision Sudden or severe stomach pain, nausea, vomiting, fever, or bloody diarrhea Vision loss Side effects that usually do not require medical attention (report to your careteam if they continue or are bothersome): Dizziness Facial flushing, redness General discomfort or fatigue This list may not describe all possible side effects. Call your doctor for medical advice about side effects. You may report side effects to FDA at1-800-FDA-1088. Where should I keep my medication? Keep out of the reach of children and pets. You will be instructed on how to store this medication. Throw away any unusedmedication after the expiration date on the label. NOTE: This sheet is a summary. It may not cover all possible information. If you have questions about this medicine, talk to your doctor, pharmacist, orhealth care provider.  2022 Elsevier/Gold Standard (2020-10-20 12:58:56) Sumatriptan Nasal Spray What is this medication? SUMATRIPTAN (soo ma TRIP tan) treats migraines. It works by blocking pain signals and narrowing blood vessels in the brain. It belongs to a group ofmedications called triptans. It is not used to prevent migraines. This medicine may be used for other purposes; ask your health care provider orpharmacist if you have questions. COMMON BRAND NAME(S): Imitrex, Tosymra What should I tell my care team before I take this medication? They need  to know if you have any of these conditions: Cigarette smoker Circulation problems in fingers and toes Heart disease High blood pressure High blood sugar (diabetes) High cholesterol History of irregular heartbeat History of stroke Kidney disease Liver disease Stomach or intestine problems An unusual or allergic reaction to sumatriptan, other medications, foods, dyes, or preservatives Pregnant or trying to get pregnant Breast-feeding How should I use this medication? This medication is for use in the nose. Follow the directions on your product or prescription label. Do not use more often than directed. Make sure that you are using your nasal spray correctly. Ask your care team if you have anyquestions. Talk to your care team regarding the use of this medication in children.Special care may be needed. Overdosage: If you think you have taken too much of this medicine contact apoison control center or emergency room at once. NOTE: This medicine is only for you. Do not share this medicine with others. What if I miss a dose? This does not apply. This medication is not for regular use. What may interact with this medication? Do not take this medication with any of the following: Certain medications for migraine headache like almotriptan, eletriptan, frovatriptan, naratriptan, rizatriptan, sumatriptan, zolmitriptan Ergot alkaloids like dihydroergotamine, ergonovine, ergotamine, methylergonovine MAOIs like Carbex, Eldepryl, Marplan, Nardil, and Parnate This medication may also interact with the following: Certain medications for depression, anxiety, or psychotic disorders This list may not describe all possible interactions. Give your health care provider a list of all the medicines, herbs, non-prescription drugs, or dietary supplements you use. Also tell them if you smoke, drink alcohol, or use illegaldrugs. Some items may interact with your medicine. What  should I watch for while using this  medication? Visit your care team for regular checks on your progress. Tell your care teamif your symptoms do not start to get better or if they get worse. You may get drowsy or dizzy. Do not drive, use machinery, or do anything that needs mental alertness until you know how this medication affects you. Do not stand up or sit up quickly, especially if you are an older patient. This reduces the risk of dizzy or fainting spells. Alcohol may interfere with theeffect of this medication. Tell your care team right away if you have any change in your eyesight. If you take migraine medications for 10 or more days a month, your migraines may get worse. Keep a diary of headache days and medication use. Contact yourcare team if your migraine attacks occur more frequently. What side effects may I notice from receiving this medication? Side effects that you should report to your care team as soon as possible: Allergic reactions-skin rash, itching, hives, swelling of the face, lips, tongue, or throat Burning, pain, tingling, or color changes in the legs or feet Heart attack-pain or tightness in the chest, shoulders, arms, or jaw, nausea, shortness of breath, cold or clammy skin, feeling faint or lightheaded Heart rhythm changes-fast or irregular heartbeat, dizziness, feeling faint or lightheaded, chest pain, trouble breathing Increase in blood pressure Raynaud's-cool, numb, or painful fingers or toes that may change color from pale, to blue, to red Seizures Serotonin syndrome-irritability, confusion, fast or irregular heartbeat, muscle stiffness, twitching muscles, sweating, high fever, seizure, chills, vomiting, diarrhea Stroke-sudden numbness or weakness of the face, arm, or leg, trouble speaking, confusion, trouble walking, loss of balance or coordination, dizziness, severe headache, change in vision Sudden or severe stomach pain, nausea, vomiting, fever, or bloody diarrhea Vision loss Side effects that  usually do not require medical attention (report to your careteam if they continue or are bothersome): Change in taste Dizziness General discomfort or fatigue Irritation inside the nose or throat This list may not describe all possible side effects. Call your doctor for medical advice about side effects. You may report side effects to FDA at1-800-FDA-1088. Where should I keep my medication? Keep out of the reach of children and pets. Store at room temperature between 2 and 30 degrees C (36 and 86 degrees F).Throw away any unused medication after the expiration date. NOTE: This sheet is a summary. It may not cover all possible information. If you have questions about this medicine, talk to your doctor, pharmacist, orhealth care provider.  2022 Elsevier/Gold Standard (2020-10-20 12:21:29)

## 2021-05-31 ENCOUNTER — Telehealth: Payer: Self-pay | Admitting: *Deleted

## 2021-05-31 NOTE — Telephone Encounter (Signed)
Effective from 05/30/2021 through 05/30/2022  PA is approved for Ubrelvy 100mg  Patient notified in my Chart

## 2021-06-27 ENCOUNTER — Telehealth: Payer: Self-pay

## 2021-06-27 NOTE — Telephone Encounter (Signed)
I submitted a PA request on CMM, Key: [id number].   Awaiting determination from [employer].

## 2021-06-28 ENCOUNTER — Encounter: Payer: Self-pay | Admitting: *Deleted

## 2021-07-02 NOTE — Telephone Encounter (Signed)
Wonderful. 

## 2021-07-02 NOTE — Telephone Encounter (Signed)
noted 

## 2021-07-02 NOTE — Telephone Encounter (Signed)
I spoke with Rebecca Farley and provided her with the information that patient has tried sumatriptan tablets and injection and maxalt tablets. She will submit this information and we should get a determination within 24-48 hours. She also said we can disregard the fax that was sent over requesting this same information.

## 2021-07-02 NOTE — Telephone Encounter (Signed)
[employer] called stating the appeal for the Lynelle Smoke has been approved.

## 2021-07-02 NOTE — Telephone Encounter (Signed)
[employer]) called, request information of the form of the medication patient has tried; Sumatriptan and Maxalt. Would like a call from the nurse.  Contact info: [phone]

## 2021-07-04 ENCOUNTER — Encounter (HOSPITAL_COMMUNITY): Payer: Self-pay | Admitting: Pharmacy Technician

## 2021-07-04 ENCOUNTER — Emergency Department (HOSPITAL_COMMUNITY): Payer: Medicare Other

## 2021-07-04 ENCOUNTER — Other Ambulatory Visit: Payer: Self-pay

## 2021-07-04 ENCOUNTER — Emergency Department (HOSPITAL_COMMUNITY)
Admission: EM | Admit: 2021-07-04 | Discharge: 2021-07-04 | Disposition: A | Discharge status: Home / Self Care | Payer: Medicare Other | Attending: Emergency Medicine | Admitting: Emergency Medicine

## 2021-07-04 DIAGNOSIS — S8001XA Contusion of right knee, initial encounter: Secondary | ICD-10-CM | POA: Insufficient documentation

## 2021-07-04 DIAGNOSIS — S60511A Abrasion of right hand, initial encounter: Secondary | ICD-10-CM | POA: Insufficient documentation

## 2021-07-04 DIAGNOSIS — Z79899 Other long term (current) drug therapy: Secondary | ICD-10-CM | POA: Diagnosis not present

## 2021-07-04 DIAGNOSIS — S0121XA Laceration without foreign body of nose, initial encounter: Secondary | ICD-10-CM | POA: Diagnosis not present

## 2021-07-04 DIAGNOSIS — S0992XA Unspecified injury of nose, initial encounter: Secondary | ICD-10-CM | POA: Diagnosis present

## 2021-07-04 DIAGNOSIS — Y9301 Activity, walking, marching and hiking: Secondary | ICD-10-CM | POA: Insufficient documentation

## 2021-07-04 DIAGNOSIS — S60512A Abrasion of left hand, initial encounter: Secondary | ICD-10-CM | POA: Diagnosis not present

## 2021-07-04 DIAGNOSIS — Z96641 Presence of right artificial hip joint: Secondary | ICD-10-CM | POA: Diagnosis not present

## 2021-07-04 DIAGNOSIS — Z955 Presence of coronary angioplasty implant and graft: Secondary | ICD-10-CM | POA: Insufficient documentation

## 2021-07-04 DIAGNOSIS — Z7982 Long term (current) use of aspirin: Secondary | ICD-10-CM | POA: Insufficient documentation

## 2021-07-04 DIAGNOSIS — Y92480 Sidewalk as the place of occurrence of the external cause: Secondary | ICD-10-CM | POA: Insufficient documentation

## 2021-07-04 DIAGNOSIS — I10 Essential (primary) hypertension: Secondary | ICD-10-CM | POA: Insufficient documentation

## 2021-07-04 DIAGNOSIS — S82001A Unspecified fracture of right patella, initial encounter for closed fracture: Secondary | ICD-10-CM

## 2021-07-04 DIAGNOSIS — W101XXA Fall (on)(from) sidewalk curb, initial encounter: Secondary | ICD-10-CM | POA: Diagnosis not present

## 2021-07-04 DIAGNOSIS — W19XXXA Unspecified fall, initial encounter: Secondary | ICD-10-CM

## 2021-07-04 MED ORDER — ONDANSETRON 4 MG PO TBDP
4.0000 mg | ORAL_TABLET | Freq: Once | ORAL | Status: AC
  Administered 2021-07-04: 4 mg via ORAL
  Filled 2021-07-04: qty 1

## 2021-07-04 MED ORDER — METOCLOPRAMIDE HCL 5 MG/ML IJ SOLN
10.0000 mg | Freq: Once | INTRAMUSCULAR | Status: AC
  Administered 2021-07-04: 10 mg via INTRAVENOUS
  Filled 2021-07-04: qty 2

## 2021-07-04 MED ORDER — SODIUM CHLORIDE 0.9 % IV BOLUS
1000.0000 mL | Freq: Once | INTRAVENOUS | Status: AC
  Administered 2021-07-04: 1000 mL via INTRAVENOUS

## 2021-07-04 MED ORDER — OXYCODONE-ACETAMINOPHEN 5-325 MG PO TABS
2.0000 | ORAL_TABLET | Freq: Once | ORAL | Status: AC
  Administered 2021-07-04: 2 via ORAL
  Filled 2021-07-04: qty 2

## 2021-07-04 NOTE — ED Notes (Signed)
Patient Alert and oriented to baseline. Stable and ambulatory to baseline. Patient verbalized understanding of the discharge instructions.  Patient belongings were taken by the patient.   

## 2021-07-04 NOTE — Discharge Instructions (Addendum)
You have a right knee fracture of your patella.  He has been given a knee brace and crutches.  You have also been given information for the orthopedic office.  Call to schedule a hospital follow-up appointment.  He also have a small nasal bone fracture as well.  Information for the ENT office is on the discharge paperwork.  Call to schedule an appointment to discuss additional options for management if you have issues with pain or breathing.

## 2021-07-04 NOTE — ED Triage Notes (Signed)
Pt bib ems mechanical fall on uneven pavement, hit face and R knee. Pt went to occupational health due to nausea after the fall. Denies blood thinners/LOC. Ice pack applied by EMS. GCS 15.  112/82 HR 60 RR 20 94% RA

## 2021-07-04 NOTE — ED Provider Notes (Signed)
[facility] EMERGENCY DEPARTMENT Provider Note   CSN: [id number] Arrival date & time: 07/04/21  2094     History Chief Complaint  Patient presents with   Rebecca Farley    Rebecca Farley is a 65 y.o. female.  65 year old female with a PMH of a flutter, htn, migraine presents to the emergency room via EMS for evaluation following a fall.  Patient is alert and oriented x4.  She states she was walking over a sidewalk, and tripped over uneven pavement.  She states she fell striking her face.  She states she is wearing her backpack on her right side which helped break her fall.  She was wearing eyeglasses at the time of her fall which broke.  She denies eye pain or sensation of a foreign body.  She has mild abrasions on bilateral hands, and reports significant pain in her right knee.  Per EMS report patient was able to ambulate at the scene.  She reports mild nausea.  She has not vomited.  She has a small laceration to the bridge of her nose, and reported epistaxis at the scene.  Her fall was witnessed by a nearby Nature conservation officer who helped her up and activated EMS.  She denies any loss of consciousness. Patient works at [employer] and was heading into work at the time of the incident.  She is currently without any other complaints. She is not on anticoagulants.   The history is provided by the patient.      Past Medical History:  Diagnosis Date   Atrial flutter ([place]) 01/2008   RADIOFREQUENCY ABLATION [place]   Barrett esophagus    Bee sting allergy    Diverticulitis    DJD (degenerative joint disease) of knee    KNEES, BACK (LUMBAR FACET DISEASE) AND HIPS, DALLDORF.   GERD (gastroesophageal reflux disease)    Hiatal hernia    History of syncope    RECURRENT   Hyperlipidemia    BORDERLINE   Hypertension    Migraines    Obesity    Osteopenia    S/P RF ablation operation for arrhythmia    Severe aortic stenosis    Shoulder tendinitis, left 2008     Patient Active Problem List   Diagnosis Date Noted   S/P AVR (aortic valve replacement) 07/27/2020   Severe aortic stenosis 07/07/2020   Hypertension    Hyperlipidemia    History of syncope    GERD (gastroesophageal reflux disease)    Barrett esophagus    S/P RF ablation operation for arrhythmia    Chronic migraine without aura, with intractable migraine, so stated, with status migrainosus 06/18/2020   Primary localized osteoarthritis of right hip 11/23/2019   Primary osteoarthritis of right hip 11/23/2019   Genetic testing 70/96/2836   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   Family history of colon cancer    Family history of melanoma    Family history of ovarian cancer    Family history of lung cancer    Family history of prostate cancer    Family history of liver cancer    Family history of carrier of genetic disease    Atrial flutter ([place]) 01/2008    Past Surgical History:  Procedure Laterality Date   ABDOMINAL HYSTERECTOMY     AORTIC VALVE REPLACEMENT N/A 07/27/2020   Procedure: AORTIC VALVE REPLACEMENT (AVR) USING 23 MM INSPIRIS RESILIA  AORTIC VALVE;  Surgeon: Gaye Pollack, MD;  Location: [facility];  Service: Open  Heart Surgery;  Laterality: N/A;   BREAST BIOPSY Left    10 yrs +   [place]     COLONOSCOPY  11/03/2012   Procedure: COLONOSCOPY;  Surgeon: Garlan Fair, MD;  Location: [facility];  Service: Endoscopy;  Laterality: N/A;   ESOPHAGOGASTRODUODENOSCOPY  11/03/2012   Procedure: ESOPHAGOGASTRODUODENOSCOPY (EGD);  Surgeon: Garlan Fair, MD;  Location: [facility];  Service: Endoscopy;  Laterality: N/A;   implanted loop device     RIGHT HEART CATH AND CORONARY ANGIOGRAPHY N/A 07/07/2020   Procedure: RIGHT HEART CATH AND CORONARY ANGIOGRAPHY;  Surgeon: Sherren Mocha, MD;  Location: [facility];  Service: Cardiovascular;  Laterality: N/A;   TEE WITHOUT CARDIOVERSION  N/A 07/27/2020   Procedure: TRANSESOPHAGEAL ECHOCARDIOGRAM (TEE);  Surgeon: Gaye Pollack, MD;  Location: [facility];  Service: Open Heart Surgery;  Laterality: N/A;   TOTAL HIP ARTHROPLASTY Right 11/23/2019   Procedure: RIGHT TOTAL HIP ARTHROPLASTY ANTERIOR APPROACH;  Surgeon: Melrose Nakayama, MD;  [facility];  Service: Orthopedics;  Laterality: Right;     OB History   No obstetric history on file.     Family History  Problem Relation Age of Onset   Hypertension Mother    Heart failure Mother    Aneurysm Mother    Kidney failure Mother    Emphysema Father    Prostate cancer Father    Lung cancer Father    Colon cancer Sister 61       dx 3 times; Lynch syndrome (PMS2)   Melanoma Sister 31   Other Sister        genetic testing - PMS2+, BRIP1+, MITF+   Melanoma Sister    Other Sister        genetic testing - PMS2+   Idiopathic pulmonary fibrosis Brother    Lung cancer Maternal Uncle    Liver cancer Maternal Uncle    Lung cancer Paternal Uncle    Ovarian cancer Paternal Grandmother        died early 16s   Other Sister        genetic testing - BRIP1+, PMS2-, MITF-   Other Sister        genetic testing - BRIP1+, MITF+, PMS2-   Colon cancer Brother 44    Social History   Tobacco Use   Smoking status: Never   Smokeless tobacco: Never  Vaping Use   Vaping Use: Never used  Substance Use Topics   Alcohol use: Yes    Comment: OCCASIONALY   Drug use: No    Home Medications Prior to Admission medications   Medication Sig Start Date End Date Taking? Authorizing Provider  acetaminophen (TYLENOL) 500 MG tablet Take 500-1,000 mg by mouth every 6 (six) hours as needed (for pain.).    [provider]  aspirin EC 325 MG EC tablet Take 1 tablet (325 mg total) by mouth daily. 08/03/20   Barrett, Erin R, PA-C  b complex vitamins tablet Take 1 tablet by mouth daily.    [provider]  Calcium Carb-Cholecalciferol (CALCIUM+D3 PO) Take 1 tablet by mouth daily.     [provider]  doxycycline (VIBRAMYCIN) 50 MG capsule Take 2 capsules 1 hour prior to any dental procedures 10/20/20   Jettie Booze, MD  EPINEPHrine 0.3 mg/0.3 mL IJ SOAJ injection Inject 0.3 mg into the muscle as needed for anaphylaxis.    [provider]  folic acid (FOLVITE) 1 MG tablet Take 1 mg by mouth daily.    [provider]  Galcanezumab-gnlm (EMGALITY) 120 MG/ML SOAJ Inject 120 mg into the skin every 30 (thirty) days. 05/30/21   Melvenia Beam, MD  Ginkgo Biloba 120 MG TABS Take 120 mg by mouth daily.    [provider]  Glucosamine-Chondroitin (COSAMIN DS PO) Take 1-2 tablets by mouth See admin instructions. Take 2 tablets by mouth in the morning & take 1 tablet by mouth at night.    [provider]  Multiple Vitamin (MULTIVITAMIN WITH MINERALS) TABS tablet Take 1 tablet by mouth daily.    [provider]  Omega-3 1000 MG CAPS Take 2,000 mg by mouth daily.    [provider]  omeprazole (PRILOSEC) 20 MG capsule Take 20 mg by mouth daily before breakfast.    [provider]  omeprazole (PRILOSEC) 40 MG capsule Take 1 capsule by mouth twice daily 30 minutes before a meal. 04/26/21     Polyethyl Glycol-Propyl Glycol (LUBRICANT EYE DROPS) 0.4-0.3 % SOLN Place 1 drop into both eyes 3 (three) times daily as needed (dry/irritated eyes.).    [provider]  Rimegepant Sulfate (NURTEC) 75 MG TBDP Take 75 mg by mouth daily as needed. For migraines. Take as close to onset of migraine as possible. One daily maximum. 04/25/21   Melvenia Beam, MD  SUMAtriptan (TOSYMRA) 10 MG/ACT SOLN Place 1 spray into the nose every hour as needed. Max 4x a day. 05/30/21   Melvenia Beam, MD  SUMAtriptan Succinate (ZEMBRACE SYMTOUCH) 3 MG/0.5ML SOAJ Inject 3 mg into the skin once as needed for up to 1 dose. May repeat in 15 minutes. If symptoms persist, repeat in 2 hours. Max 4 injections daily. 05/30/21   Melvenia Beam, MD   Turmeric 500 MG TABS Take 1,000 mg by mouth daily.    [provider]  Ubrogepant (UBRELVY) 100 MG TABS Take 100 mg by mouth every 2 (two) hours as needed. Maximum 232m a day. 05/30/21   AMelvenia Beam MD  valsartan (DIOVAN) 160 MG tablet Take 1 tablet (160 mg total) by mouth daily. 12/26/20   VJettie Booze MD    Allergies    Mobic [meloxicam], Penicillins, Codeine, and Nsaids  Review of Systems   Review of Systems  Constitutional:  Negative for activity change, chills and fever.  HENT:  Positive for nosebleeds. Negative for sinus pain and trouble swallowing.   Gastrointestinal:  Positive for nausea. Negative for abdominal pain and vomiting.  Musculoskeletal:  Positive for arthralgias and joint swelling.  Skin:  Positive for color change (bruising right knee.) and wound (minor lac to nose).  Neurological:  Negative for dizziness, syncope and headaches.  All other systems reviewed and are negative.  Physical Exam Updated Vital Signs There were no vitals taken for this visit.  Physical Exam Vitals and nursing note reviewed.  Constitutional:      General: She is not in acute distress.    Appearance: Normal appearance. She is not ill-appearing.  HENT:     Head: Normocephalic. No raccoon eyes, Battle's sign, right periorbital erythema or left periorbital erythema.     Right Ear: Hearing normal.     Left Ear: Hearing normal.     Nose: Signs of injury and laceration present. No septal deviation.     Right Nostril: Epistaxis (dry blood present in right nare.) present. No occlusion.     Left Nostril: No epistaxis or  occlusion.  Eyes:     General: Lids are normal. Vision grossly intact. Gaze aligned appropriately.     Extraocular Movements: Extraocular movements intact.     Right eye: Normal extraocular motion and no nystagmus.     Left eye: Normal extraocular motion and no nystagmus.  Cardiovascular:     Rate and Rhythm: Normal rate and regular rhythm.     Pulses:           Dorsalis pedis pulses are 2+ on the right side and 2+ on the left side.  Pulmonary:     Effort: Pulmonary effort is normal. No respiratory distress.  Abdominal:     General: There is no distension.     Palpations: Abdomen is soft.     Tenderness: There is no abdominal tenderness. There is no guarding.  Musculoskeletal:     Right shoulder: No tenderness or bony tenderness.     Left shoulder: No tenderness or bony tenderness.     Right wrist: Normal.     Left wrist: Normal.     Comments: Right knee with significant swelling, pain, and some bruising.  Significant TTP.  She is able to flex and extend her right knee but limited secondary to pain.  Right ankle with full range of motion, and without TTP.  Full internal and external rotation present in the right lower extremity.  Left lower extremity without visual swelling, deformity.  Full range of motion present left lower extremity.  Minor abrasions present on bilateral hands.  Full range of motion present in bilateral hands.  Strength intact.  Neurological:     General: No focal deficit present.     Mental Status: She is alert and oriented to person, place, and time.    ED Results / Procedures / Treatments   Labs (all labs ordered are listed, but only abnormal results are displayed) Labs Reviewed - No data to display  EKG None  Radiology No results found.  Procedures .Marland KitchenLaceration Repair  Date/Time: 07/04/2021 4:44 PM Performed by: Evlyn Courier, PA-C Authorized by: Evlyn Courier, PA-C   Consent:    Consent obtained:  Verbal   Consent given by:  Patient   Risks, benefits, and alternatives were discussed: yes     Risks discussed:  Poor cosmetic result, pain, poor wound healing, infection and retained foreign body   Alternatives discussed:  No treatment Universal protocol:    Procedure explained and questions answered to patient or proxy's satisfaction: yes     Imaging studies available: yes     Patient identity confirmed:   Verbally with patient Laceration details:    Location:  Face   Face location:  Nose   Length (cm):  0.5 Pre-procedure details:    Preparation:  Imaging obtained to evaluate for foreign bodies Treatment:    Area cleansed with:  Saline   Amount of cleaning:  Standard Skin repair:    Repair method:  Tissue adhesive Approximation:    Approximation:  Close Repair type:    Repair type:  Simple Post-procedure details:    Dressing:  Open (no dressing)   Procedure completion:  Tolerated well, no immediate complications Comments:     Minor laceration on nasal bridge repaired with Dermabond.   Medications Ordered in ED Medications  oxyCODONE-acetaminophen (PERCOCET/ROXICET) 5-325 MG per tablet 2 tablet (has no administration in time range)  ondansetron (ZOFRAN-ODT) disintegrating tablet 4 mg (has no administration in time range)    ED Course  I have reviewed the triage vital signs  and the nursing notes.  Pertinent labs & imaging results that were available during my care of the patient were reviewed by me and considered in my medical decision making (see chart for details).  Clinical Course as of 07/04/21 1639  Wed Jul 04, 2021  1020 DG Knee Complete 4 Views Right [AA]    Clinical Course User Index [AA] Evlyn Courier, PA-C   MDM Rules/Calculators/A&P                           65 year old female presents via EMS for for evaluation after a mechanical fall with right knee pain, facial trauma, and nausea.  She is alert oriented x4.  No indication for brain imaging.  Given facial trauma and laceration to the nose will evaluate with CT maxillofacial for underlying fractures.  Will x-ray right knee for fracture.  If negative will consider CT imaging for occult fractures.  10 mg of Percocet and Zofran given for symptom management.  CT maxillofacial with Mildly depressed right nasal bone fracture without displacement.  Nondisplaced avulsion fracture of the patella.  Patient given knee  immobilizer and crutches.  Educated on nonweightbearing and follow-up with orthopedics.  She has also been given follow-up instructions for ENT.  During the stay in the ER around 9 she had transient hypotension resolved with a liter bolus.  Prior to discharge she is without orthostasis.  She denies lightheadedness, dizziness, headache.  She is appropriate for discharge.  Final Clinical Impression(s) / ED Diagnoses Final diagnoses:  None    Rx / DC Orders ED Discharge Orders     None        Evlyn Courier, PA-C 07/04/21 1648    Tegeler, Gwenyth Allegra, MD 07/06/21 [phone]

## 2021-07-04 NOTE — Progress Notes (Signed)
Orthopedic Tech Progress Note Patient Details:  Rebecca Farley Aug 23, 1956 [id number]  Ortho Devices Type of Ortho Device: Knee splint, Crutches Ortho Device/Splint Location: Right knee Ortho Device/Splint Interventions: Ordered, Application, Adjustment   Post Interventions Patient Tolerated: Well Instructions Provided: Adjustment of device, Care of device  Rebecca Farley 07/04/2021, 4:29 PM

## 2021-07-04 NOTE — ED Notes (Signed)
Pt called out, increasing nausea, headache.

## 2021-07-09 ENCOUNTER — Ambulatory Visit: Payer: Medicare Other | Admitting: Neurology

## 2021-07-11 ENCOUNTER — Ambulatory Visit (INDEPENDENT_AMBULATORY_CARE_PROVIDER_SITE_OTHER): Payer: Medicare Other | Admitting: Interventional Cardiology

## 2021-07-11 ENCOUNTER — Encounter: Payer: Self-pay | Admitting: Interventional Cardiology

## 2021-07-11 ENCOUNTER — Other Ambulatory Visit: Payer: Self-pay

## 2021-07-11 VITALS — BP 148/88 | HR 116 | Ht 65.0 in | Wt 200.0 lb

## 2021-07-11 DIAGNOSIS — I10 Essential (primary) hypertension: Secondary | ICD-10-CM | POA: Diagnosis not present

## 2021-07-11 DIAGNOSIS — I4892 Unspecified atrial flutter: Secondary | ICD-10-CM | POA: Diagnosis not present

## 2021-07-11 DIAGNOSIS — I350 Nonrheumatic aortic (valve) stenosis: Secondary | ICD-10-CM

## 2021-07-11 MED ORDER — DOXYCYCLINE HYCLATE 50 MG PO CAPS: ORAL_CAPSULE | ORAL | 3 refills | Status: DC

## 2021-07-11 MED ORDER — ASPIRIN EC 81 MG PO TBEC
81.0000 mg | DELAYED_RELEASE_TABLET | Freq: Every day | ORAL | 3 refills | Status: AC
Start: 2021-07-11 — End: ?

## 2021-07-11 NOTE — Progress Notes (Signed)
Cardiology Office Note   Date:  07/11/2021   ID:  Rebecca Farley, DOB March 25, 1956, MRN [id number]  PCP:  Kirby Funk, MD    No chief complaint on file.  S/p AVR  Wt Readings from Last 3 Encounters:  07/11/21 200 lb (90.7 kg)  05/30/21 204 lb 6.4 oz (92.7 kg)  01/17/21 199 lb 11.8 oz (90.6 kg)       History of Present Illness: Rebecca Farley is a 65 y.o. female  With a h/o atrial flutter and AS.  Works at [employer].   She had a h/o atrial flutter in the past and had an ablation in [place] many years ago.  Sx of atrial flutter were extreme dizziness.  No palpitations.    She had a heart murmur on her routine exam and was sent for an echo in 2020.  She was not having any new symptoms.   She does report some DOE for 15 years.  She has had soe dizzy spells for 15 years as well.  It can happen even when she was sitting down.  She wore a monitor many years ago and this was ewhen the AFlutter was diagnosed.  HR was 300 bpm per her report.   She had severe AS treated with SAVR in 2021 (Aortic valve replacement using a 23 mm Edwards INSPIRIS RESILIA valve.)   She has had some dizzy spells.  No syncope.  Can occur while she is sitting.   Fell after tripping and broke knee cap and bone on forehead. Seeig\ng ortho.  Denies : Chest pain.  Leg edema. Nitroglycerin use. Orthopnea. Palpitations. Paroxysmal nocturnal dyspnea. Shortness of breath. Syncope.     Past Medical History:  Diagnosis Date   Atrial flutter ([facility]) 01/2008   [place]IOF[place]ENCY [place]BLATION [place]   Barrett esophagus    Bee sting allergy    Diverticulitis    DJD (degenerative joint disease) of knee    KNEES, BACK (LUMBAR FACET DISEASE) AND HIPS, DALLDORF.   GERD (gastroesophageal reflux disease)    Hiatal hernia    History of syncope    RECURRENT   Hyperlipidemia    BORDERLINE   Hypertension    Migraines    Obesity    Osteopenia    S/P RF ablation operation for arrhythmia    Severe aortic  stenosis    Shoulder tendinitis, left 2008    Past Surgical History:  Procedure Laterality Date   ABDOMINAL HYSTERECTOMY     AORTIC VALVE REPLACEMENT N/A 07/27/2020   Procedure: AORTIC VALVE REPLACEMENT (AVR) USING 23 MM INSPIRIS RESILIA  AORTIC VALVE;  Surgeon: Alleen Borne, MD;  Location[facility];  Service: Open Heart Surgery;  Laterality: N/A;   BREAST BIOPSY Left    10 yrs +   CARDIAC CATHETERIZATION     CESAREAN SECTION     1979, 1984   CHOLECYSTECTOMY     COLONOSCOPY  11/03/2012   Procedure: COLONOSCOPY;  Surgeon: Charolett Bumpers, MD;  Location: [facility];  Service: Endoscopy;  Laterality: N/A;   ESOPHAGOGASTRODUODENOSCOPY  11/03/2012   Procedure: ESOPHAGOGASTRODUODENOSCOPY (EGD);  Surgeon: Charolett Bumpers, MD;  Location: [facility];  Service: Endoscopy;  Laterality: N/A;   implanted loop device     RIGHT HEART CATH AND CORONARY ANGIOGRAPHY N/A 07/07/2020   Procedure: RIGHT HEART CATH AND CORONARY ANGIOGRAPHY;  Surgeon: Tonny Bollman, MD;  Location: [facility];  Service: Cardiovascular;  Laterality: N/A;   TEE WITHOUT CARDIOVERSION N/A 07/27/2020  Procedure: TRANSESOPHAGEAL ECHOCARDIOGRAM (TEE);  Surgeon: Alleen Borne, MD;  Location: [facility];  Service: Open Heart Surgery;  Laterality: N/A;   TOTAL HIP ARTHROPLASTY Right 11/23/2019   Procedure: RIGHT TOTAL HIP ARTHROPLASTY ANTERIOR APPROACH;  Surgeon: Marcene Corning, MD;  [facility];  Service: Orthopedics;  Laterality: Right;     Current Outpatient Medications  Medication Sig Dispense Refill   acetaminophen (TYLENOL) 500 MG tablet Take 500-1,000 mg by mouth every 6 (six) hours as needed (for pain.).     aspirin EC 325 MG EC tablet Take 1 tablet (325 mg total) by mouth daily. 30 tablet 0   b complex vitamins tablet Take 1 tablet by mouth daily.     Calcium Carb-Cholecalciferol (CALCIUM+D3 PO) Take 1 tablet by mouth daily.     doxycycline (VIBRAMYCIN) 50 MG capsule Take 2 capsules 1 hour prior to any  dental procedures 2 capsule 3   EPINEPHrine 0.3 mg/0.3 mL IJ SOAJ injection Inject 0.3 mg into the muscle as needed for anaphylaxis.     FLOVENT HFA 110 MCG/ACT inhaler Inhale 2 puffs into the lungs 2 (two) times daily.     folic acid (FOLVITE) 1 MG tablet Take 1 mg by mouth daily.     Galcanezumab-gnlm (EMGALITY) 120 MG/ML SOAJ Inject 120 mg into the skin every 30 (thirty) days. 1.12 mL 11   Ginkgo Biloba 120 MG TABS Take 120 mg by mouth daily.     Glucosamine-Chondroitin (COSAMIN DS PO) Take 1-2 tablets by mouth See admin instructions. Take 2 tablets by mouth in the morning & take 1 tablet by mouth at night.     Multiple Vitamin (MULTIVITAMIN WITH MINERALS) TABS tablet Take 1 tablet by mouth daily.     Omega-3 1000 MG CAPS Take 2,000 mg by mouth daily.     omeprazole (PRILOSEC) 20 MG capsule Take 20 mg by mouth daily before breakfast.     Polyethyl Glycol-Propyl Glycol (LUBRICANT EYE DROPS) 0.4-0.3 % SOLN Place 1 drop into both eyes 3 (three) times daily as needed (dry/irritated eyes.).     Rimegepant Sulfate (NURTEC) 75 MG TBDP Take 75 mg by mouth daily as needed. For migraines. Take as close to onset of migraine as possible. One daily maximum. 8 tablet 0   SUMAtriptan (TOSYMRA) 10 MG/ACT SOLN Place 1 spray into the nose every hour as needed. Max 4x a day. 12 each 6   SUMAtriptan Succinate (ZEMBRACE SYMTOUCH) 3 MG/0.5ML SOAJ Inject 3 mg into the skin once as needed for up to 1 dose. May repeat in 15 minutes. If symptoms persist, repeat in 2 hours. Max 4 injections daily. 0.5 mL 11   Turmeric 500 MG TABS Take 1,000 mg by mouth daily.     Ubrogepant (UBRELVY) 100 MG TABS Take 100 mg by mouth every 2 (two) hours as needed. Maximum 200mg  a day. 16 tablet 11   valsartan (DIOVAN) 160 MG tablet Take 1 tablet (160 mg total) by mouth daily. 90 tablet 3   omeprazole (PRILOSEC) 40 MG capsule Take 1 capsule by mouth twice daily 30 minutes before a meal. (Patient not taking: Reported on 07/11/2021) 60 capsule  1   No current facility-administered medications for this visit.    Allergies:   Mobic [meloxicam], Penicillins, Codeine, and Nsaids    Social History:  The patient  reports that she has never smoked. She has never used smokeless tobacco. She reports current alcohol use. She reports that she does not use drugs.   Family History:  The patient's family history includes Aneurysm in her mother; Colon cancer (age of onset: 13) in her sister; Colon cancer (age of onset: 7) in her brother; Emphysema in her father; Heart failure in her mother; Hypertension in her mother; Idiopathic pulmonary fibrosis in her brother; Kidney failure in her mother; Liver cancer in her maternal uncle; Lung cancer in her father, maternal uncle, and paternal uncle; Melanoma in her sister; Melanoma (age of onset: 60) in her sister; Other in her sister, sister, sister, and sister; Ovarian cancer in her paternal grandmother; Prostate cancer in her father.    ROS:  Please see the history of present illness.   Otherwise, review of systems are positive for occasional dizziness.   All other systems are reviewed and negative.    PHYSICAL EXAM: VS:  BP (!) 148/88   Pulse (!) 116   Ht 5\' 5"  (1.651 m)   Wt 200 lb (90.7 kg)   SpO2 98%   BMI 33.28 kg/m  , BMI Body mass index is 33.28 kg/m. GEN: Well nourished, well developed, in no acute distress HEENT: normal Neck: no JVD, carotid bruits, or masses Cardiac: RRR; no murmurs, rubs, or gallops,no edema  Respiratory:  clear to auscultation bilaterally, normal work of breathing GI: soft, nontender, nondistended, + BS MS: no deformity or atrophy; cast on right leg- walking on crutches Skin: warm and dry, no rash Neuro:  Strength and sensation are intact Psych: euthymic mood, full affect   EKG:   The ekg ordered today demonstrates sinus tach   Recent Labs: 07/28/2020: Magnesium 2.0 12/25/2020: ALT 17; Hemoglobin 12.6; NT-Pro BNP 83; Platelets 242; TSH 2.890 01/04/2021: BUN  15; Creatinine, Ser 0.79; Potassium 4.3; Sodium 140   Lipid Panel No results found for: CHOL, TRIG, HDL, CHOLHDL, VLDL, LDLCALC, LDLDIRECT   Other studies Reviewed: Additional studies/ records that were reviewed today with results demonstrating: 2022 echo reviewed.   ASSESSMENT AND PLAN:  Aortic valve stenosis: s/p AVR. Needs SBE prophylaxis. Will refill doxycycline.  Decrease aspirin to 81 mg daily.  Valve was functioning well based on echocardiogram earlier this year.  No clinical signs of valve failure. Atrial flutter: Was on Amio post op.  This was stopped.  Status post ablation in [place] many years ago. HTN: BP at home is high if her pain level is high.  Home readings are well controlled when pain level is controlled.  May need ortho surgery based on appt tomorrow with Dr. Weimar.  Sinus tachycardia today likely related to the long walk-in with her crutches and cast.    Current medicines are reviewed at length with the patient today.  The patient concerns regarding her medicines were addressed.  The following changes have been made:  No change  Labs/ tests ordered today include:  No orders of the defined types were placed in this encounter.   Recommend 150 minutes/week of aerobic exercise Low fat, low carb, high fiber diet recommended  Disposition:   FU in 1 year   Signed, Jerl Santos, MD  07/11/2021 4:26 PM    [facility] [address] [place], [place], [place]  [place] Phone: [phone]; Fax: [phone]

## 2021-07-11 NOTE — Patient Instructions (Signed)
Medication Instructions:  Your physician has recommended you make the following change in your medication: Decrease aspirin to 81 mg by mouth daily  *If you need a refill on your cardiac medications before your next appointment, please call your pharmacy*   Lab Work: none If you have labs (blood work) drawn today and your tests are completely normal, you will receive your results only by: MyChart Message (if you have MyChart) OR A paper copy in the mail If you have any lab test that is abnormal or we need to change your treatment, we will call you to review the results.   Testing/Procedures: none   Follow-Up: At [facility], you and your health needs are our priority.  As part of our continuing mission to provide you with exceptional heart care, we have created designated Provider Care Teams.  These Care Teams include your primary Cardiologist (physician) and Advanced Practice Providers (APPs -  Physician Assistants and Nurse Practitioners) who all work together to provide you with the care you need, when you need it.  We recommend signing up for the patient portal called "MyChart".  Sign up information is provided on this After Visit Summary.  MyChart is used to connect with patients for Virtual Visits (Telemedicine).  Patients are able to view lab/test results, encounter notes, upcoming appointments, etc.  Non-urgent messages can be sent to your provider as well.   To learn more about what you can do with MyChart, go to [email or web address].    Your next appointment:   12 month(s)  The format for your next appointment:   In Person  Provider:   You may see Lance Muss, MD or one of the following Advanced Practice Providers on your designated Care Team:   Ronie Spies, PA-C Jacolyn Reedy, PA-C   Other Instructions

## 2021-07-31 ENCOUNTER — Other Ambulatory Visit: Payer: Self-pay

## 2021-07-31 ENCOUNTER — Other Ambulatory Visit: Payer: Self-pay | Admitting: Internal Medicine

## 2021-07-31 ENCOUNTER — Ambulatory Visit
Admission: RE | Admit: 2021-07-31 | Discharge: 2021-07-31 | Disposition: A | Discharge status: Home / Self Care | Payer: Medicare Other | Source: Ambulatory Visit | Attending: Internal Medicine | Admitting: Internal Medicine

## 2021-07-31 DIAGNOSIS — R053 Chronic cough: Secondary | ICD-10-CM

## 2021-08-13 ENCOUNTER — Other Ambulatory Visit (HOSPITAL_COMMUNITY): Payer: Self-pay

## 2021-09-20 ENCOUNTER — Institutional Professional Consult (permissible substitution): Payer: Medicare Other | Admitting: Student

## 2021-09-24 ENCOUNTER — Ambulatory Visit (INDEPENDENT_AMBULATORY_CARE_PROVIDER_SITE_OTHER): Payer: Medicare Other | Admitting: Internal Medicine

## 2021-09-24 ENCOUNTER — Other Ambulatory Visit: Payer: Self-pay

## 2021-09-24 ENCOUNTER — Encounter: Payer: Self-pay | Admitting: Internal Medicine

## 2021-09-24 VITALS — BP 126/78 | HR 79 | Temp 97.9°F | Ht 65.0 in | Wt 205.8 lb

## 2021-09-24 DIAGNOSIS — R053 Chronic cough: Secondary | ICD-10-CM | POA: Diagnosis not present

## 2021-09-24 MED ORDER — FLUTICASONE PROPIONATE 50 MCG/ACT NA SUSP: 1.0000 | Freq: Every day | NASAL | 5 refills | Status: DC

## 2021-09-24 MED ORDER — CETIRIZINE HCL 10 MG PO TABS: 10.0000 mg | ORAL_TABLET | Freq: Every day | ORAL | 5 refills | Status: DC

## 2021-09-24 NOTE — Progress Notes (Signed)
Rebecca Farley    [id number]    1956/09/19  Primary Care Physician:Griffin, Jenny Reichmann, MD  Referring Physician: Lavone Orn, MD [place] [address],  [place] [address] Reason for Consultation: chronic cough Date of Consultation: 09/24/2021  Chief complaint:   Chief Complaint  Patient presents with   Consult    Pt states she has had a cough for about 2 years and states that it can happen throughout the day.     HPI: Rebecca Farley is a 65 y.o. woman who presents for new patient evaluation of cough. Notes it has been progressively worsening over the last 2 years.   It is all day, some days a hacking cough, some days where she brings up mucus.  Symptoms do not wake her up at night, but if she is awoken for other reasons at night, she'll start coughing.  She has tried increasing omeprazole to 40 mg BID which did not affect the cough (tried this for a month.) She was also tried on steroids and an inhaler - she thought the steroids maybe helped a little and the inhaler didn't help at all.   She also notes that there was no change with stopping valsartan also.  She uses lozenges during the day to help suppress the cough.   If she is talking for long periods of time the coughing gets worse.   She does have breakthrough reflux on omeprazole.   She does feel like she needs to clear her throat frequently, but denies drainage. She does have itchy/watery eyes with rhinorrhea.   She gets short of breath with exertion - and has some chest pressure when coming in from the cold. Albuterol inhaler did not help her.   She has a history of AVR for severe AS in 2021 - this is doing well.   Social history:  Occupation: she works for [employer] in Engineer, technical sales.  Exposures: lives at home with daughter  Smoking history: never smoker, had passive smoker remotely with spouse.   Social History   Occupational History   Not on file  Tobacco Use   Smoking status: Never   Smokeless  tobacco: Never  Vaping Use   Vaping Use: Never used  Substance and Sexual Activity   Alcohol use: Yes    Comment: OCCASIONALY   Drug use: No   Sexual activity: Not Currently    Birth control/protection: Surgical    Relevant family history:  Family History  Problem Relation Age of Onset   Hypertension Mother    Heart failure Mother    Aneurysm Mother    Kidney failure Mother    Lung cancer Mother    Emphysema Father    Prostate cancer Father    Lung cancer Father    Colon cancer Sister 59       dx 3 times; Lynch syndrome (PMS2)   Melanoma Sister 60   Other Sister        genetic testing - PMS2+, BRIP1+, MITF+   Melanoma Sister    Other Sister        genetic testing - PMS2+   Other Sister        genetic testing - BRIP1+, PMS2-, MITF-   Other Sister        genetic testing - BRIP1+, MITF+, PMS2-   Idiopathic pulmonary fibrosis Brother    Colon cancer Brother 23   Ovarian cancer Paternal Grandmother  died early 76s   Lung cancer Maternal Uncle    Liver cancer Maternal Uncle    Lung cancer Paternal Uncle     Past Medical History:  Diagnosis Date   Atrial flutter ([place]) 01/2008   RADIOFREQUENCY ABLATION [place]   Barrett esophagus    Bee sting allergy    Diverticulitis    DJD (degenerative joint disease) of knee    KNEES, BACK (LUMBAR FACET DISEASE) AND HIPS, DALLDORF.   GERD (gastroesophageal reflux disease)    Hiatal hernia    History of syncope    RECURRENT   Hyperlipidemia    BORDERLINE   Hypertension    Migraines    Obesity    Osteopenia    S/P RF ablation operation for arrhythmia    Severe aortic stenosis    Shoulder tendinitis, left 2008    Past Surgical History:  Procedure Laterality Date   ABDOMINAL HYSTERECTOMY     AORTIC VALVE REPLACEMENT N/A 07/27/2020   Procedure: AORTIC VALVE REPLACEMENT (AVR) USING 23 MM INSPIRIS RESILIA  AORTIC VALVE;  Surgeon: Gaye Pollack, MD;  Location: [place];  Service: Open Heart Surgery;   Laterality: N/A;   BREAST BIOPSY Left    10 yrs +   [place]     COLONOSCOPY  11/03/2012   Procedure: COLONOSCOPY;  Surgeon: Garlan Fair, MD;  [facility]: [facility];  Service: Endoscopy;  Laterality: N/A;   ESOPHAGOGASTRODUODENOSCOPY  11/03/2012   Procedure: ESOPHAGOGASTRODUODENOSCOPY (EGD);  Surgeon: Garlan Fair, MD;  Location: [facility];  Service: Endoscopy;  Laterality: N/A;   implanted loop device     RIGHT HEART CATH AND CORONARY ANGIOGRAPHY N/A 07/07/2020   Procedure: RIGHT HEART CATH AND CORONARY ANGIOGRAPHY;  Surgeon: Sherren Mocha, MD;  Location: [facility];  Service: Cardiovascular;  Laterality: N/A;   TEE WITHOUT CARDIOVERSION N/A 07/27/2020   Procedure: TRANSESOPHAGEAL ECHOCARDIOGRAM (TEE);  Surgeon: Gaye Pollack, MD;  Location: [place];  Service: Open Heart Surgery;  Laterality: N/A;   TOTAL HIP ARTHROPLASTY Right 11/23/2019   Procedure: RIGHT TOTAL HIP ARTHROPLASTY ANTERIOR APPROACH;  Surgeon: Melrose Nakayama, MD;  [facility];  Service: Orthopedics;  Laterality: Right;     Physical Exam: Blood pressure 126/78, pulse 79, temperature 97.9 F (36.6 C), temperature source Oral, height 5' 5"  (1.651 m), weight 205 lb 12.8 oz (93.4 kg), SpO2 97 %. Gen:      No acute distress ENT:  +cobblestoning in oropharynx, no nasal polyps, mucus membranes moist Lungs:    No increased respiratory effort, symmetric chest wall excursion, clear to auscultation bilaterally, no wheezes or crackles CV:         Regular rate and rhythm; no murmurs, rubs, or gallops.  No pedal edema Abd:      + bowel sounds; soft, non-tender; no distension MSK: no acute synovitis of DIP or PIP joints, no mechanics hands.  Skin:      Warm and dry; no rashes Neuro: normal speech, no focal facial asymmetry Psych: alert and oriented x3, normal mood and affect   Data Reviewed/Medical Decision Making:  Independent  interpretation of tests: Imaging:  Review of patient's chest xray images 08/03/2021 revealed no acute cardiopulmonary process. The patient's images have been independently reviewed by me.    PFTs: No flowsheet data found.  Labs:  Lab Results  Component Value Date   WBC 8.6 12/25/2020   HGB 12.6  12/25/2020   HCT 36.8 12/25/2020   MCV 89 12/25/2020   PLT 242 12/25/2020   Lab Results  Component Value Date   NA 140 01/04/2021   K 4.3 01/04/2021   CL 103 01/04/2021   CO2 22 01/04/2021     Echocardiogram Normal LVEF Aortic valve replacement  Immunization status:  Immunization History  Administered Date(s) Administered   Influenza, High Dose Seasonal PF 07/07/2021   PFIZER(Purple Top)SARS-COV-2 Vaccination 01/06/2020   Pneumococcal Conjugate-13 03/15/2019   Pneumococcal Polysaccharide-23 04/07/2020   Zoster Recombinat (Shingrix) 03/19/2019, 06/17/2019     I reviewed prior external note(s) from pcp, cardiology, ED  I reviewed the result(s) of the labs and imaging as noted above.   I have ordered  Assessment:  Chronic Cough, likely upper airway cough syndrome GERD Non allergic rhinitis  Plan/Recommendations:  Suspect combination of gerd with rhinitis. Continue PPI for reflux once daily. Increasing this previously to BID did not help.  Will trial flonase with scheduled anti-histamine.  This seems less likely to be related to lung/airways disease based on symptoms and failure to improve with albuterol/prednisone  We discussed disease management and progression at length today.    Return to Care: Return in about 3 months (around 12/23/2021).  Lenice Llamas, MD Pulmonary and [place]  CC: Lavone Orn, MD

## 2021-09-24 NOTE — Patient Instructions (Addendum)
Please schedule follow up scheduled with myself in 3 months.  If my schedule is not open yet, we will contact you with a reminder closer to that time. Please call [phone] if you haven't heard from [place] a month before.   Start flonase.  Start cetirizine at night.   Flonase - 1 spray on each side of your nose twice a day for first week, then 1 spray on each side.   Instructions for use: If you also use a saline nasal spray or rinse, use that first. Position the head with the chin slightly tucked. Use the right hand to spray into the left nostril and the right hand to spray into the left nostril.   Point the bottle away from the septum of your nose (cartilage that divides the two sides of your nose).  Hold the nostril closed on the opposite side from where you will spray Spray once and gently sniff to pull the medicine into the higher parts of your nose.  Don't sniff too hard as the medicine will drain down the back of your throat instead. Repeat with a second spray on the same side if prescribed. Repeat on the other side of your nose.

## 2021-09-27 ENCOUNTER — Telehealth: Payer: Self-pay | Admitting: *Deleted

## 2021-09-27 NOTE — Telephone Encounter (Signed)
Rebecca Farley (Key: [id number]) Emgality 120MG /ML auto-injectors (migraine)  PA emgality was sent this am waiting on approval

## 2021-09-27 NOTE — Telephone Encounter (Signed)
Effective from 09/27/2021 through 09/27/2022. Will contact pt through mychart to make her aware of prescription

## 2021-09-27 NOTE — Telephone Encounter (Signed)
Jazmine from [facility] called to inform of approval effective dates 09-27-21 to 09-27-22 call back # is [id number] option 5

## 2021-10-18 ENCOUNTER — Other Ambulatory Visit (HOSPITAL_COMMUNITY): Payer: Self-pay

## 2021-10-18 MED ORDER — PAXLOVID (300/100) 20 X 150 MG & 10 X 100MG PO TBPK
ORAL_TABLET | ORAL | 0 refills | Status: DC
  Filled 2021-10-18: qty 30, 5d supply, fill #0

## 2021-11-06 ENCOUNTER — Telehealth: Payer: Self-pay

## 2021-11-06 NOTE — Telephone Encounter (Signed)
I have submitted a PA request for Emgality 120mg .mL on CMM, Key: . Awaiting determination from [employer].

## 2021-11-07 NOTE — Telephone Encounter (Signed)
Received a message from [facility] stating Emgality PA was received however this medication is actually on pt's formulary with a quantity limit of 2 per 30 days. I called [facility] pharmacy. They can fill again tomorrow.

## 2021-12-06 IMAGING — CT CT CTA ABD/PEL W/CM AND/OR W/O CM
1 series · 1 of 1 positions shown · non-contrast
Comparison: No priors.

CLINICAL DATA: 64-year-old female with history of severe aortic
stenosis. Preprocedural study prior to potential transcatheter
aortic valve replacement (TAVR) procedure.

EXAM:
CT ANGIOGRAPHY CHEST, ABDOMEN AND PELVIS
TECHNIQUE: Non-contrast CT of the chest was initially obtained.

[Series 568: area 425 · 0.10mm/px · 1 of 1 slices shown]
[im 1/1]
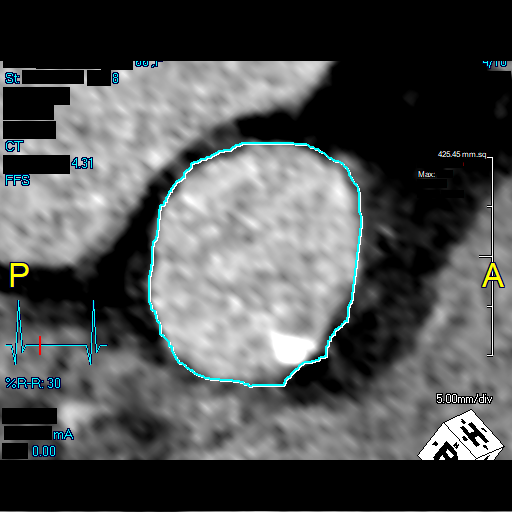

[1 of 1 positions shown; findings below may reference images not displayed]

Multidetector CT imaging through the chest, abdomen and pelvis was
performed using the standard protocol during bolus administration of
intravenous contrast. Multiplanar reconstructed images and MIPs were
obtained and reviewed to evaluate the vascular anatomy.

CONTRAST:  100mL OMNIPAQUE IOHEXOL 350 MG/ML SOLN
FINDINGS: CTA CHEST FINDINGS

Cardiovascular: Heart size is normal. There is no significant
pericardial fluid, thickening or pericardial calcification. There is
aortic atherosclerosis, as well as atherosclerosis of the great
vessels of the mediastinum and the coronary arteries, including
calcified atherosclerotic plaque in the left main and left anterior
descending coronary arteries. Severe thickening calcification of the
aortic valve. There is also some thickening and calcification of the
mitral valve.

Mediastinum/Lymph Nodes: No pathologically enlarged mediastinal or
hilar lymph nodes. Esophagus is unremarkable in appearance. No
axillary lymphadenopathy.

Lungs/Pleura: No suspicious appearing pulmonary nodules or masses
are noted. No acute consolidative airspace disease. No pleural
effusions. Mild linear scarring in the lower lobes of the lungs
bilaterally.

Musculoskeletal/Soft Tissues: There are no aggressive appearing
lytic or blastic lesions noted in the visualized portions of the
skeleton.

CTA ABDOMEN AND PELVIS FINDINGS

Hepatobiliary: No suspicious cystic or solid hepatic lesions. Very
mild intrahepatic biliary ductal dilatation. Common bile duct is
dilated measuring 13 mm in the porta hepatis. Status post
cholecystectomy.

Pancreas: No pancreatic mass. No pancreatic ductal dilatation. No
pancreatic or peripancreatic fluid collections or inflammatory
changes.

Spleen: Unremarkable.

Adrenals/Urinary Tract: 1.4 cm intermediate attenuation (32
Hounsfield unit) lesion in the upper pole the left kidney is
indeterminate. Other small low-attenuation lesions in the kidneys
bilaterally compatible with simple cysts and subcentimeter
low-attenuation lesions too small to definitively characterize, but
also favored to represent small cysts. Bilateral adrenal glands are
normal in appearance. No hydroureteronephrosis. Urinary bladder is
normal in appearance.

Stomach/Bowel: The appearance of the stomach is normal. There is no
pathologic dilatation of small bowel or colon. Several colonic
diverticulae are noted, most evident in the sigmoid colon, without
surrounding inflammatory changes to suggest an acute diverticulitis
at this time. Normal appendix.

Vascular/Lymphatic: Aortic atherosclerosis, without evidence of
aneurysm or dissection in the abdominal or pelvic vasculature. No
lymphadenopathy noted in the abdomen or pelvis.

Reproductive: Status post hysterectomy. Ovaries are not confidently
identified may be surgically absent or atrophic.

Other: No significant volume of ascites.  No pneumoperitoneum.

Musculoskeletal: Status post right hip arthroplasty. There are no
aggressive appearing lytic or blastic lesions noted in the
visualized portions of the skeleton.

VASCULAR MEASUREMENTS PERTINENT TO TAVR:

AORTA:

Minimal Aortic Eiameter-5G x 14 mm

Severity of Aortic Calcification-mild-to-moderate

RIGHT PELVIS:

Right Common Iliac Artery -

Minimal 2iameter-D.N x 7.9 mm

Tortuosity-mild

Calcification-none

Right External Iliac Artery -

Minimal Kiameter-V.E x 6.8 mm

Tortuosity-moderate

Calcification - none

Right Common Femoral Artery -

Minimal Niameter-5.5 x 7.9 mm

Tortuosity-mild

Calcification - none

LEFT PELVIS:

Left Common Iliac Artery -

Minimal Niameter-5.5 x 8.5 mm

Tortuosity-mild

Calcification - none

Left External Iliac Artery -

Minimal Fiameter-1.W x 5.9 mm

Tortuosity-mild

Calcification - none

Left Common Femoral Artery -

Minimal Aiameter-0.0 x 7.2 mm

Tortuosity-mild

Calcification - none

Review of the MIP images confirms the above findings.
IMPRESSION: 1. Vascular findings and measurements pertinent to potential TAVR
procedure, as detailed above.
2. Severe thickening calcification of the aortic valve, compatible
with reported clinical history of severe aortic stenosis.
3. Aortic atherosclerosis, in addition to left main and left
anterior descending coronary artery disease.
4. Mild thickening calcification of the mitral valve.
5. Status post cholecystectomy. There is very mild intrahepatic
biliary ductal dilatation and common bile duct is dilated measuring
up to 13 mm in the porta hepatis. This is favored to reflect benign
post cholecystectomy physiology, but correlation with liver function
tests is suggested to exclude signs of biliary obstruction.
6. Colonic diverticulosis without evidence of acute diverticulitis
at this time.
7. Additional incidental findings, as above.

## 2021-12-14 ENCOUNTER — Ambulatory Visit: Payer: Medicare Other | Admitting: Internal Medicine

## 2021-12-17 ENCOUNTER — Other Ambulatory Visit: Payer: Self-pay | Admitting: Interventional Cardiology

## 2022-01-29 ENCOUNTER — Encounter: Payer: Self-pay | Admitting: Internal Medicine

## 2022-01-29 ENCOUNTER — Ambulatory Visit (INDEPENDENT_AMBULATORY_CARE_PROVIDER_SITE_OTHER): Payer: Medicare Other | Admitting: Internal Medicine

## 2022-01-29 VITALS — BP 124/82 | HR 81 | Ht 65.0 in | Wt 201.8 lb

## 2022-01-29 DIAGNOSIS — K219 Gastro-esophageal reflux disease without esophagitis: Secondary | ICD-10-CM | POA: Diagnosis not present

## 2022-01-29 DIAGNOSIS — R053 Chronic cough: Secondary | ICD-10-CM

## 2022-01-29 NOTE — Progress Notes (Signed)
? ?      ?Rebecca Farley    [id number]    09-19-1956 ? ?Primary Care Physician:Griffin, Jenny Reichmann, MD ?Date of Appointment: 01/29/2022 ?Established Patient Visit ? ?Chief complaint:   ?Chief Complaint  ?Patient presents with  ? Follow-up  ?  F/U on cough. States she is still using the nasal spray. Cough has decreased. Productive cough with clear phlegm.   ? ? ? ?HPI: ?Rebecca Farley is a 66 y.o. woman who presents with chronic cough felt to be secondary to post nasal drainage with combination of GERD.  ? ?Interval Updates: ?Here for follow up. She doesn't feel like the nasal spray was helping but the cough is better.  ?She thinks the cetirizine might be helping more. Has noticed a major improvement with cough. ? ?Still has breakthrough reflux. Still taking omeprazole once a day. Has noticed with certain foods she still has breakthrough reflux but upping to BID PPI did not improve this. No nausea, vomiting, weight loss, dysphagia  ? ?I have reviewed the patient's family social and past medical history and updated as appropriate.  ? ?Past Medical History:  ?Diagnosis Date  ? Atrial flutter ([place]) 01/2008  ? [place]  ? Barrett esophagus   ? Bee sting allergy   ? Diverticulitis   ? DJD (degenerative joint disease) of knee   ? KNEES, BACK (LUMBAR FACET DISEASE) AND HIPS, DALLDORF.  ? GERD (gastroesophageal reflux disease)   ? Hiatal hernia   ? History of syncope   ? RECURRENT  ? Hyperlipidemia   ? BORDERLINE  ? Hypertension   ? Migraines   ? Obesity   ? Osteopenia   ? S/P RF ablation operation for arrhythmia   ? Severe aortic stenosis   ? Shoulder tendinitis, left 2008  ? ? ?Past Surgical History:  ?Procedure Laterality Date  ? ABDOMINAL HYSTERECTOMY    ? AORTIC VALVE REPLACEMENT N/A 07/27/2020  ? Procedure: AORTIC VALVE REPLACEMENT (AVR) USING 23 MM INSPIRIS RESILIA  AORTIC VALVE;  Surgeon: Gaye Pollack, MD;  Location: [facility];  Service: Open Heart Surgery;  Laterality: N/A;  ? BREAST  BIOPSY Left   ? 10 yrs +  ? CARDIAC CATHETERIZATION    ? CESAREAN SECTION    ? 1979, 1984  ? CHOLECYSTECTOMY    ? COLONOSCOPY  11/03/2012  ? Procedure: COLONOSCOPY;  Surgeon: Garlan Fair, MD;  [facility];  Service: Endoscopy;  Laterality: N/A;  ? ESOPHAGOGASTRODUODENOSCOPY  11/03/2012  ? Procedure: ESOPHAGOGASTRODUODENOSCOPY (EGD);  Surgeon: Garlan Fair, MD;  Location[facility];  Service: Endoscopy;  Laterality: N/A;  ? implanted loop device    ? RIGHT HEART CATH AND CORONARY ANGIOGRAPHY N/A 07/07/2020  ? Procedure: RIGHT HEART CATH AND CORONARY ANGIOGRAPHY;  Surgeon: Sherren Mocha, MD;  Location: [facility] [facility];  Service: Cardiovascular;  Laterality: N/A;  ? TEE WITHOUT CARDIOVERSION N/A 07/27/2020  ? Procedure: TRANSESOPHAGEAL ECHOCARDIOGRAM (TEE);  Surgeon: Gaye Pollack, MD;  Location: [facility];  Service: Open Heart Surgery;  Laterality: N/A;  ? TOTAL HIP ARTHROPLASTY Right 11/23/2019  ? Procedure: RIGHT TOTAL HIP ARTHROPLASTY ANTERIOR APPROACH;  Surgeon: Melrose Nakayama, MD;  [facility];  Service: Orthopedics;  Laterality: Right;  ? ? ?Family History  ?Problem Relation Age of Onset  ? Hypertension Mother   ? Heart failure Mother   ? Aneurysm Mother   ? Kidney failure Mother   ? Lung cancer Mother   ? Emphysema Father   ?  Prostate cancer Father   ? Lung cancer Father   ? Colon cancer Sister 108  ?     dx 3 times; Lynch syndrome (PMS2)  ? Melanoma Sister 68  ? Other Sister   ?     genetic testing - PMS2+, BRIP1+, MITF+  ? Melanoma Sister   ? Other Sister   ?     genetic testing - PMS2+  ? Other Sister   ?     genetic testing - BRIP1+, PMS2-, MITF-  ? Other Sister   ?     genetic testing - BRIP1+, MITF+, PMS2-  ? Idiopathic pulmonary fibrosis Brother   ? Colon cancer Brother 53  ? Ovarian cancer Paternal Grandmother   ?     died early 5s  ? Lung cancer Maternal Uncle   ? Liver cancer Maternal Uncle   ? Lung cancer Paternal Uncle   ? ? ?Social History  ? ?Occupational History  ? Not  on file  ?Tobacco Use  ? Smoking status: Never  ? Smokeless tobacco: Never  ?Vaping Use  ? Vaping Use: Never used  ?Substance and Sexual Activity  ? Alcohol use: Yes  ?  Comment: OCCASIONALY  ? Drug use: No  ? Sexual activity: Not Currently  ?  Birth control/protection: Surgical  ? ? ? ?Physical Exam: ?Blood pressure 124/82, pulse 81, height 5' 5"  (1.651 m), weight 201 lb 12.8 oz (91.5 kg), SpO2 97 %. ? ?Gen:      No acute distress ?ENT:  no nasal polyps, mucus membranes moist ?Lungs:    No increased respiratory effort, symmetric chest wall excursion, clear to auscultation bilaterally, no wheezes or crackles ?CV:         Regular rate and rhythm; no murmurs, rubs, or gallops.  No pedal edema ? ? ?Data Reviewed: ?Imaging: ? ? ?PFTs: ? ?   ? View : No data to display.  ?  ?  ?  ? ? ?Labs: ? ?Immunization status: ?Immunization History  ?Administered Date(s) Administered  ? Influenza, High Dose Seasonal PF 07/07/2021  ? PFIZER(Purple Top)SARS-COV-2 Vaccination 01/06/2020  ? Pneumococcal Conjugate-13 03/15/2019  ? Pneumococcal Polysaccharide-23 04/07/2020  ? Zoster Recombinat (Shingrix) 03/19/2019, 06/17/2019  ? ? ?External Records Personally Reviewed:  ? ?Assessment:  ?UACS ?GERD ? ?Plan/Recommendations: ?Continue cetirizine nightly ok to stop flonase if no benefit.  ?Continue PPI once daily.  ? ?GERD lifestyle modifications discussed ? ?Return to Care: ?Return if symptoms worsen or fail to improve. ? ? ?Lenice Llamas, MD ?Pulmonary and Critical Care Medicine ?[place] ?Office:[phone] ? ? ? ? ? ?

## 2022-01-29 NOTE — Patient Instructions (Signed)
Follow up as needed. ? ?Ok to stop flonase and continue cetirizine.  ? ? ?What is GERD? ?Gastroesophageal reflux disease (GERD) is gastroesophageal reflux diseasewhich occurs when the lower esophageal sphincter (LES) opens spontaneously, for varying periods of time, or does not close properly and stomach contents rise up into the esophagus. GER is also called acid reflux or acid regurgitation, because digestive juices--called acids--rise up with the food. The esophagus is the tube that carries food from the mouth to the stomach. The LES is a ring of muscle at the bottom of the esophagus that acts like a valve between the esophagus and stomach. ? ?When acid reflux occurs, food or fluid can be tasted in the back of the mouth. When refluxed stomach acid touches the lining of the esophagus it may cause a burning sensation in the chest or throat called heartburn or acid indigestion. Occasional reflux is common. Persistent reflux that occurs more than twice a week is considered GERD, and it can eventually lead to more serious health problems. People of all ages can have GERD. Studies have shown that GERD may worsen or contribute to asthma, chronic cough, and pulmonary fibrosis. ? ? ?What are the symptoms of GERD? ?The main symptom of GERD in adults is frequent heartburn, also called acid indigestion--burning-type pain in the lower part of the mid-chest, behind the breast bone, and in the mid-abdomen.  Not all reflux is acidic in nature, and many patients don't have heart burn at all. Sometimes it feels like a cough (either dry or with mucus), choking sensation, asthma, shortness of breath, waking up at night, frequent throat clearing, or trouble swallowing.  ? ? ?What causes GERD? ?The reason some people develop GERD is still unclear. However, research shows that in people with GERD, the LES relaxes while the rest of the esophagus is working. Anatomical abnormalities such as a hiatal hernia may also contribute to GERD. A  hiatal hernia occurs when the upper part of the stomach and the LES move above the diaphragm, the muscle wall that separates the stomach from the chest. Normally, the diaphragm helps the LES keep acid from rising up into the esophagus. When a hiatal hernia is present, acid reflux can occur more easily. A hiatal hernia can occur in people of any age and is most often a normal finding in otherwise healthy people over age 33. Most of the time, a hiatal hernia produces no symptoms.  ? ?Other factors that may contribute to GERD include ?- Obesity or recent weight gain ?- Pregnancy  ?- Smoking  ?- Diet ?- Certain medications ? ?Common foods that can worsen reflux symptoms include: ?- carbonated beverages ?- artificial sweeteners ?- citrus fruits  ?- chocolate  ?- drinks with caffeine or alcohol  ?- fatty and fried foods  ?- garlic and onions  ?- mint flavorings  ?- spicy foods  ?- tomato-based foods, like spaghetti sauce, salsa, chili, and pizza  ? ?Lifestyle Changes ?If you smoke, stop.  ?Avoid foods and beverages that worsen symptoms (see above.) ?Lose weight if needed.  ?Eat small, frequent meals.  ?Wear loose-fitting clothes.  ?Avoid lying down for 3 hours after a meal.  ?Raise the head of your bed 6 to 8 inches by securing wood blocks under the bedposts. Just using extra pillows will not help, but using a wedge-shaped pillow may be helpful. ? ?Medications ? ?H2 blockers, such as cimetidine (Tagamet HB), famotidine (Pepcid AC), nizatidine (Axid AR), and ranitidine (Zantac 75), decrease acid production. They are  available in prescription strength and over-the-counter strength. These drugs provide short-term relief and are effective for about half of those who have GERD symptoms. ? ?Proton pump inhibitors include omeprazole (Prilosec, Zegerid), lansoprazole (Prevacid), pantoprazole (Protonix), rabeprazole (Aciphex), and esomeprazole (Nexium), which are available by prescription. Prilosec is also available in  over-the-counter strength. Proton pump inhibitors are more effective than H2 blockers and can relieve symptoms and heal the esophageal lining in almost everyone who has GERD. ? ?Because drugs work in different ways, combinations of medications may help control symptoms. People who get heartburn after eating may take both antacids and H2 blockers. The antacids work first to neutralize the acid in the stomach, and then the H2 blockers act on acid production. By the time the antacid stops working, the H2 blocker will have stopped acid production. Your health care provider is the best source of information about how to use medications for GERD. ? ? ?Points to Remember ?1. You can have GERD without having heartburn. Your symptoms could include a dry cough, asthma symptoms, or trouble swallowing. ? ?2. Taking medications daily as prescribed is important in controlling you symptoms.  Sometimes it can take up to 8 weeks to fully achieve the effects of the medications prescribed. ? ?3. Coughing related to GERD can be difficult to treat and is very frustrating!  However, it is important to stick with these medications and lifestyle modifications before pursuing more aggressive or invasive test and treatments. ? ?

## 2022-03-19 ENCOUNTER — Other Ambulatory Visit: Payer: Self-pay | Admitting: Internal Medicine

## 2022-03-19 DIAGNOSIS — Z1231 Encounter for screening mammogram for malignant neoplasm of breast: Secondary | ICD-10-CM

## 2022-03-21 ENCOUNTER — Ambulatory Visit
Admission: RE | Admit: 2022-03-21 | Discharge: 2022-03-21 | Disposition: A | Discharge status: Home / Self Care | Payer: Medicare Other | Source: Ambulatory Visit | Attending: Internal Medicine | Admitting: Internal Medicine

## 2022-03-21 DIAGNOSIS — Z1231 Encounter for screening mammogram for malignant neoplasm of breast: Secondary | ICD-10-CM

## 2022-04-27 ENCOUNTER — Other Ambulatory Visit: Payer: Self-pay | Admitting: Interventional Cardiology

## 2022-05-15 ENCOUNTER — Other Ambulatory Visit: Payer: Self-pay | Admitting: Internal Medicine

## 2022-05-15 DIAGNOSIS — R053 Chronic cough: Secondary | ICD-10-CM

## 2022-05-30 ENCOUNTER — Ambulatory Visit: Payer: Medicare Other | Admitting: Neurology

## 2022-06-03 ENCOUNTER — Other Ambulatory Visit: Payer: Self-pay | Admitting: Neurology

## 2022-06-03 DIAGNOSIS — G43711 Chronic migraine without aura, intractable, with status migrainosus: Secondary | ICD-10-CM

## 2022-06-03 DIAGNOSIS — R5383 Other fatigue: Secondary | ICD-10-CM

## 2022-06-03 DIAGNOSIS — R519 Headache, unspecified: Secondary | ICD-10-CM

## 2022-06-08 ENCOUNTER — Other Ambulatory Visit: Payer: Self-pay | Admitting: Internal Medicine

## 2022-06-08 DIAGNOSIS — R053 Chronic cough: Secondary | ICD-10-CM

## 2022-07-01 ENCOUNTER — Other Ambulatory Visit: Payer: Self-pay | Admitting: Neurology

## 2022-07-01 DIAGNOSIS — R519 Headache, unspecified: Secondary | ICD-10-CM

## 2022-07-01 DIAGNOSIS — R5383 Other fatigue: Secondary | ICD-10-CM

## 2022-07-01 DIAGNOSIS — G43711 Chronic migraine without aura, intractable, with status migrainosus: Secondary | ICD-10-CM

## 2022-07-15 ENCOUNTER — Other Ambulatory Visit: Payer: Self-pay | Admitting: Interventional Cardiology

## 2022-08-07 ENCOUNTER — Other Ambulatory Visit: Payer: Self-pay | Admitting: Neurology

## 2022-08-07 DIAGNOSIS — G43711 Chronic migraine without aura, intractable, with status migrainosus: Secondary | ICD-10-CM

## 2022-08-07 DIAGNOSIS — R5383 Other fatigue: Secondary | ICD-10-CM

## 2022-08-07 DIAGNOSIS — R519 Headache, unspecified: Secondary | ICD-10-CM

## 2022-08-13 ENCOUNTER — Telehealth: Payer: Self-pay | Admitting: Neurology

## 2022-08-13 DIAGNOSIS — R5383 Other fatigue: Secondary | ICD-10-CM

## 2022-08-13 DIAGNOSIS — R519 Headache, unspecified: Secondary | ICD-10-CM

## 2022-08-13 DIAGNOSIS — G43711 Chronic migraine without aura, intractable, with status migrainosus: Secondary | ICD-10-CM

## 2022-08-13 MED ORDER — EMGALITY 120 MG/ML ~~LOC~~ SOAJ: 120.0000 mg | SUBCUTANEOUS | 0 refills | Status: DC

## 2022-08-13 NOTE — Telephone Encounter (Signed)
Refill sent x 1

## 2022-08-13 NOTE — Telephone Encounter (Signed)
Pt is calling. Requesting a refill on medication Galcanezumab-gnlm (EMGALITY) 120 MG/ML SOAJ. Refill should be sent to [facility]. Pt schedule an yearly f/u with Jessica on 11/27@ 3:45pm

## 2022-08-19 ENCOUNTER — Other Ambulatory Visit: Payer: Self-pay | Admitting: Interventional Cardiology

## 2022-08-28 NOTE — Progress Notes (Deleted)
[facility]    Provider:  Dr Jaynee Eagles Requesting Provider: Lavone Orn, MD Primary Care Provider:  Lavone Orn, MD  CC:  Worsening migraines  HPI:  Update 09/02/2022 JM: Patient returns for migraine follow-up after prior visit with Dr. Jaynee Eagles over 1 year ago.  She has remained on Emgality monthly injection ***.        Update 05/30/2021 Dr. Jaynee Eagles; sHE WAS STARTED ON eMGALITY AND NURTEC ABOUT A YEAR AGO. Interval history 05/30/2021: She is doing great on emgality. As preventative. The problem is acutely, waking up with a headache, when it is full blown. Nurtec works in a few hours IF the headache isn;t on waking up. So will try ubrelvy. Tosymra. And next go to injections of imitrex.  Try Roselyn Meier acutely Try Tosymra acutely Can also try imitrex injection (but not at the same time as a tosymra spray)  Consult visit 06/15/2020 Dr. Jaynee Eagles: MAYCIE LUERA is a 66 y.o. female here as requested by Lavone Orn, MD for migraines. I reviewed Dr. Delene Ruffini notes: She has a past medical history of degenerative joint disease of the hip, osteopenia, severe aortic stenosis, migraine headache, degenerative joint disease of the knee, a flutter, hypertension, GERD, obesity and other chronic pain.  It appears at last appointment she was treated with imipramine 25 mg and baclofen 10 mg possibly for management of migraine headaches, also on valsartan hydrochlorothiazide for blood pressure however this can be used for migraine management, she follows with cardiology for severe aortic stenosis, imipramine for prevention with headaches but requested neurology again (not sure where she had been to prior.  She reported to Dr. Laurann Montana that she had more headaches recently, she moved into a new home in the setting of stress, sometimes migraines lasting 2 to 3 days, taking baclofen for acute headaches and imipramine preventively, tries to stay healthy, she is exercising regularly, not been a good sleeper  for years, she saw a headache wellness center once in the past but did not go back.  I also reviewed Dr. Delene Ruffini labs which were collected November 04, 2019 which showed a normal BMP with BUN 12 and creatinine 0.76, unremarkable CBC with differential,   She is here with worsening migraines. She snores and wakes herself up. She has morning headaches, recent fatigue. Diagnosed with migraines 36 years ago. After she had her daughter. Migraines unilateral used to be always behind the right eye, pulsating, pounding, throbbing, severe, now it is behind the left eye sometimes, then it may radiate, the headaches in the morning are less migrainous more holocephalic. +light/sound sensitivity, nausea, movement makes it worse, the headache is positional and exertional, daily, worsening, continuous all day long, can be severe to the point she can vomit, ongoing and worsening the last 6 months, they can last 3 days. No other focal neurologic deficits, associated symptoms, inciting events or modifiable factors.  Reviewed notes, labs and imaging from outside physicians, which showed: see above  Review of Systems: Patient complains of symptoms per HPI as well as the following symptoms: stress . Pertinent negatives and positives per HPI. All others negative    Social History   Socioeconomic History   Marital status: Widowed    Spouse name: Not on file   Number of children: 3   Years of education: 12   Highest education level: High school graduate  Occupational History   Not on file  Tobacco Use   Smoking status: Never   Smokeless tobacco: Never  Vaping Use  Vaping Use: Never used  Substance and Sexual Activity   Alcohol use: Yes    Comment: OCCASIONALY   Drug use: No   Sexual activity: Not Currently    Birth control/protection: Surgical  Other Topics Concern   Not on file  Social History Narrative   Not on file   Social Determinants of Health   Financial Resource Strain: Not on file  Food  Insecurity: Not on file  Transportation Needs: Not on file  Physical Activity: Not on file  Stress: Not on file  Social Connections: Not on file  Intimate Partner Violence: Not on file    Family History  Problem Relation Age of Onset   Hypertension Mother    Heart failure Mother    Aneurysm Mother    Kidney failure Mother    Lung cancer Mother    Emphysema Father    Prostate cancer Father    Lung cancer Father    Colon cancer Sister 80       dx 3 times; Lynch syndrome (PMS2)   Melanoma Sister 28   Other Sister        genetic testing - PMS2+, BRIP1+, MITF+   Melanoma Sister    Other Sister        genetic testing - PMS2+   Other Sister        genetic testing - BRIP1+, PMS2-, MITF-   Other Sister        genetic testing - BRIP1+, MITF+, PMS2-   Idiopathic pulmonary fibrosis Brother    Colon cancer Brother 52   Ovarian cancer Paternal Grandmother        died early 10s   Lung cancer Maternal Uncle    Liver cancer Maternal Uncle    Lung cancer Paternal Uncle     Past Medical History:  Diagnosis Date   Atrial flutter ([place]) 01/2008   RADIOFREQUENCY ABLATION [place]   Barrett esophagus    Bee sting allergy    Diverticulitis    DJD (degenerative joint disease) of knee    KNEES, BACK (LUMBAR FACET DISEASE) AND HIPS, DALLDORF.   GERD (gastroesophageal reflux disease)    Hiatal hernia    History of syncope    RECURRENT   Hyperlipidemia    BORDERLINE   Hypertension    Migraines    Obesity    Osteopenia    S/P RF ablation operation for arrhythmia    Severe aortic stenosis    Shoulder tendinitis, left 2008    Patient Active Problem List   Diagnosis Date Noted   S/P AVR (aortic valve replacement) 07/27/2020   Severe aortic stenosis 07/07/2020   Hypertension    Hyperlipidemia    History of syncope    GERD (gastroesophageal reflux disease)    Barrett esophagus    S/P RF ablation operation for arrhythmia    Chronic migraine without aura, with intractable  migraine, so stated, with status migrainosus 06/18/2020   Primary localized osteoarthritis of right hip 11/23/2019   Primary osteoarthritis of right hip 11/23/2019   Genetic testing 46/96/2952   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   Family history of colon cancer    Family history of melanoma    Family history of ovarian cancer    Family history of lung cancer    Family history of prostate cancer    Family history of liver cancer    Family history of carrier of genetic disease    Atrial flutter ([place]) 01/2008    Past  Surgical History:  Procedure Laterality Date   ABDOMINAL HYSTERECTOMY     AORTIC VALVE REPLACEMENT N/A 07/27/2020   Procedure: AORTIC VALVE REPLACEMENT (AVR) USING 23 MM INSPIRIS RESILIA  AORTIC VALVE;  Surgeon: Gaye Pollack, MD;  Location: [facility];  Service: Open Heart Surgery;  Laterality: N/A;   BREAST BIOPSY Left    10 yrs +   [place]     COLONOSCOPY  11/03/2012   Procedure: COLONOSCOPY;  Surgeon: Garlan Fair, MD;  Location: [facility];  Service: Endoscopy;  Laterality: N/A;   ESOPHAGOGASTRODUODENOSCOPY  11/03/2012   Procedure: ESOPHAGOGASTRODUODENOSCOPY (EGD);  Surgeon: Garlan Fair, MD;  Location: [facility];  Service: Endoscopy;  Laterality: N/A;   implanted loop device     RIGHT HEART CATH AND CORONARY ANGIOGRAPHY N/A 07/07/2020   Procedure: RIGHT HEART CATH AND CORONARY ANGIOGRAPHY;  Surgeon: Sherren Mocha, MD;  Location: [facility];  Service: Cardiovascular;  Laterality: N/A;   TEE WITHOUT CARDIOVERSION N/A 07/27/2020   Procedure: TRANSESOPHAGEAL ECHOCARDIOGRAM (TEE);  Surgeon: Gaye Pollack, MD;  Location: [facility];  Service: Open Heart Surgery;  Laterality: N/A;   TOTAL HIP ARTHROPLASTY Right 11/23/2019   Procedure: RIGHT TOTAL HIP ARTHROPLASTY ANTERIOR APPROACH;  Surgeon: Melrose Nakayama, MD;  Location: [facility];  Service: Orthopedics;  Laterality: Right;     Current Outpatient Medications  Medication Sig Dispense Refill   acetaminophen (TYLENOL) 500 MG tablet Take 500-1,000 mg by mouth every 6 (six) hours as needed (for pain.).     aspirin EC 81 MG tablet Take 1 tablet (81 mg total) by mouth daily. Swallow whole. 90 tablet 3   b complex vitamins tablet Take 1 tablet by mouth daily.     Calcium Carb-Cholecalciferol (CALCIUM+D3 PO) Take 1 tablet by mouth daily.     cetirizine (ZYRTEC) 10 MG tablet TAKE 1 TABLET BY MOUTH AT BEDTIME 30 tablet 5   doxycycline (VIBRAMYCIN) 50 MG capsule Take 2 capsules 1 hour prior to any dental procedures 2 capsule 3   EPINEPHrine 0.3 mg/0.3 mL IJ SOAJ injection Inject 0.3 mg into the muscle as needed for anaphylaxis.     fluticasone (FLONASE) 50 MCG/ACT nasal spray Place 1 spray into both nostrils daily. 16 g 5   folic acid (FOLVITE) 1 MG tablet Take 1 mg by mouth daily.     Galcanezumab-gnlm (EMGALITY) 120 MG/ML SOAJ Inject 120 mg into the skin every 30 (thirty) days. Must be seen for further refills. Call [phone] to schedule. 1 mL 0   Ginkgo Biloba 120 MG TABS Take 120 mg by mouth daily.     Glucosamine-Chondroitin (COSAMIN DS PO) Take 1-2 tablets by mouth See admin instructions. Take 2 tablets by mouth in the morning & take 1 tablet by mouth at night.     Multiple Vitamin (MULTIVITAMIN WITH MINERALS) TABS tablet Take 1 tablet by mouth daily.     Omega-3 1000 MG CAPS Take 2,000 mg by mouth daily.     omeprazole (PRILOSEC) 20 MG capsule Take 20 mg by mouth daily before breakfast.     Polyethyl Glycol-Propyl Glycol (LUBRICANT EYE DROPS) 0.4-0.3 % SOLN Place 1 drop into both eyes 3 (three) times daily as needed (dry/irritated eyes.).     SUMAtriptan (TOSYMRA) 10 MG/ACT SOLN Place 1 spray into the nose every hour as needed. Max 4x a day. 12 each 6   SUMAtriptan Succinate (ZEMBRACE SYMTOUCH) 3 MG/0.5ML SOAJ  Inject 3 mg into the skin once as needed for up to 1 dose. May repeat in 15 minutes. If symptoms persist,  repeat in 2 hours. Max 4 injections daily. 0.5 mL 11   Turmeric 500 MG TABS Take 1,000 mg by mouth daily.     Ubrogepant (UBRELVY) 100 MG TABS Take 100 mg by mouth every 2 (two) hours as needed. Maximum 236m a day. 16 tablet 11   valsartan (DIOVAN) 160 MG tablet Take 1 tablet (160 mg total) by mouth daily. Pt needs to make appt with provider for further refills - 2nd attempt 15 tablet 0   No current facility-administered medications for this visit.    Allergies as of 09/02/2022 - Review Complete 01/29/2022  Allergen Reaction Noted   Mobic [meloxicam] Shortness Of Breath 06/16/2019   Penicillins Anaphylaxis, Hives, and Other (See Comments) 09/05/2011   Codeine  06/16/2019   Nsaids Itching and Nausea And Vomiting 09/05/2011    Vitals: There were no vitals taken for this visit. Last Weight:  Wt Readings from Last 1 Encounters:  01/29/22 201 lb 12.8 oz (91.5 kg)   Last Height:   Ht Readings from Last 1 Encounters:  01/29/22 _0  (1.651 m)    Exam: NAD, pleasant                  Speech:    Speech is normal; fluent and spontaneous with normal comprehension.  Cognition:    The patient is oriented to person, place, and time;     recent and remote memory intact;     language fluent;    Cranial Nerves:    The pupils are equal, round, and reactive to light.Trigeminal sensation is intact and the muscles of mastication are normal. The face is symmetric. The palate elevates in the midline. Hearing intact. Voice is normal. Shoulder shrug is normal. The tongue has normal motion without fasciculations.   Coordination:  No dysmetria  Motor Observation:    No asymmetry, no atrophy, and no involuntary movements noted. Tone:    Normal muscle tone.     Strength:    Strength is V/V in the upper and lower limbs.      Sensation: intact to LT     Assessment/Plan:  Really lovely 66year old with worsening headaches likely progression of chronic migraines however needs thorough  evaluation.  She is doing great on emgality. As preventative. The problem is acutely, waking up with a headache, when it is full blown. Nurtec works in a few hours IF the headache isn;t on waking up. So will try ubrelvy. Tosymra. And next go to injections of imitrex.  Try URoselyn Meieracutely Try Tosymra acutely Can also try zembrace  She snores and wakes herself up. Improved. She has morning headaches, recent fatigue. She declined a sleep test  MRI of the brain w/wo contrast: she declines at this time, ordered it last year and she is better so will just monitor  No orders of the defined types were placed in this encounter.   No orders of the defined types were placed in this encounter.    I spent *** minutes of face-to-face and non-face-to-face time with patient.  This included previsit chart review, lab review, study review, order entry, electronic health record documentation, patient education  JFrann Rider [facility] [address] [address] Phone [phone] [phone]: This document was prepared with digital dictation and possible smart phrase technology. Any transcriptional errors  that result from this process are unintentional.

## 2022-09-02 ENCOUNTER — Ambulatory Visit: Payer: Medicare Other | Admitting: Adult Health

## 2022-09-04 ENCOUNTER — Other Ambulatory Visit: Payer: Self-pay | Admitting: Neurology

## 2022-09-04 DIAGNOSIS — G43711 Chronic migraine without aura, intractable, with status migrainosus: Secondary | ICD-10-CM

## 2022-09-04 DIAGNOSIS — R5383 Other fatigue: Secondary | ICD-10-CM

## 2022-09-04 DIAGNOSIS — R519 Headache, unspecified: Secondary | ICD-10-CM

## 2022-09-09 NOTE — Progress Notes (Unsigned)
[facility]    Provider:  Dr Jaynee Eagles Requesting Provider: Lavone Orn, MD Primary Care Provider:  Lavone Orn, MD  CC:  migraines  HPI:  Update 09/09/2022 JM: Patient returns for migraine follow-up after prior visit with Dr. Jaynee Eagles over 1 year ago.  Unfortunately, she ran out of her Emgality refills with last injection in October and has been experiencing worsening migraines since that time.  Prior to running out, she was experiencing about 2-3 migraines per month.  Will use Ubrelvy with benefit at times but will not work well with morning headache.  She has tried sumatriptan injection but unable to remember if beneficial.  She has not yet trialed sumatriptan nasal spray.    Update 05/30/2021 Dr. Jaynee Eagles; sHE WAS STARTED ON eMGALITY AND NURTEC ABOUT A YEAR AGO. Interval history 05/30/2021: She is doing great on emgality. As preventative. The problem is acutely, waking up with a headache, when it is full blown. Nurtec works in a few hours IF the headache isn;t on waking up. So will try ubrelvy. Tosymra. And next go to injections of imitrex.  Try Roselyn Meier acutely Try Tosymra acutely Can also try imitrex injection (but not at the same time as a tosymra spray)  Consult visit 06/15/2020 Dr. Jaynee Eagles: LEGNA MAUSOLF is a 66 y.o. female here as requested by Lavone Orn, MD for migraines. I reviewed Dr. Delene Ruffini notes: She has a past medical history of degenerative joint disease of the hip, osteopenia, severe aortic stenosis, migraine headache, degenerative joint disease of the knee, a flutter, hypertension, GERD, obesity and other chronic pain.  It appears at last appointment she was treated with imipramine 25 mg and baclofen 10 mg possibly for management of migraine headaches, also on valsartan hydrochlorothiazide for blood pressure however this can be used for migraine management, she follows with cardiology for severe aortic stenosis, imipramine for prevention with headaches but  requested neurology again (not sure where she had been to prior.  She reported to Dr. Laurann Montana that she had more headaches recently, she moved into a new home in the setting of stress, sometimes migraines lasting 2 to 3 days, taking baclofen for acute headaches and imipramine preventively, tries to stay healthy, she is exercising regularly, not been a good sleeper for years, she saw a headache wellness center once in the past but did not go back.  I also reviewed Dr. Delene Ruffini labs which were collected November 04, 2019 which showed a normal BMP with BUN 12 and creatinine 0.76, unremarkable CBC with differential,   She is here with worsening migraines. She snores and wakes herself up. She has morning headaches, recent fatigue. Diagnosed with migraines 36 years ago. After she had her daughter. Migraines unilateral used to be always behind the right eye, pulsating, pounding, throbbing, severe, now it is behind the left eye sometimes, then it may radiate, the headaches in the morning are less migrainous more holocephalic. +light/sound sensitivity, nausea, movement makes it worse, the headache is positional and exertional, daily, worsening, continuous all day long, can be severe to the point she can vomit, ongoing and worsening the last 6 months, they can last 3 days. No other focal neurologic deficits, associated symptoms, inciting events or modifiable factors.  Reviewed notes, labs and imaging from outside physicians, which showed: see above  Review of Systems: Patient complains of symptoms per HPI as well as the following symptoms: stress . Pertinent negatives and positives per HPI. All others negative    Social History   Socioeconomic History  Marital status: Widowed    Spouse name: Not on file   Number of children: 3   Years of education: 12   Highest education level: High school graduate  Occupational History   Not on file  Tobacco Use   Smoking status: Never   Smokeless tobacco: Never   Vaping Use   Vaping Use: Never used  Substance and Sexual Activity   Alcohol use: Yes    Comment: OCCASIONALY   Drug use: No   Sexual activity: Not Currently    Birth control/protection: Surgical  Other Topics Concern   Not on file  Social History Narrative   Not on file   Social Determinants of Health   Financial Resource Strain: Not on file  Food Insecurity: Not on file  Transportation Needs: Not on file  Physical Activity: Not on file  Stress: Not on file  Social Connections: Not on file  Intimate Partner Violence: Not on file    Family History  Problem Relation Age of Onset   Hypertension Mother    Heart failure Mother    Aneurysm Mother    Kidney failure Mother    Lung cancer Mother    Emphysema Father    Prostate cancer Father    Lung cancer Father    Colon cancer Sister 34       dx 3 times; Lynch syndrome (PMS2)   Melanoma Sister 102   Other Sister        genetic testing - PMS2+, BRIP1+, MITF+   Melanoma Sister    Other Sister        genetic testing - PMS2+   Other Sister        genetic testing - BRIP1+, PMS2-, MITF-   Other Sister        genetic testing - BRIP1+, MITF+, PMS2-   Idiopathic pulmonary fibrosis Brother    Colon cancer Brother 82   Ovarian cancer Paternal Grandmother        died early 19s   Lung cancer Maternal Uncle    Liver cancer Maternal Uncle    Lung cancer Paternal Uncle     Past Medical History:  Diagnosis Date   Atrial flutter ([place]) 01/2008   RADIOFREQUENCY ABLATION [place]   Barrett esophagus    Bee sting allergy    Diverticulitis    DJD (degenerative joint disease) of knee    KNEES, BACK (LUMBAR FACET DISEASE) AND HIPS, DALLDORF.   GERD (gastroesophageal reflux disease)    Hiatal hernia    History of syncope    RECURRENT   Hyperlipidemia    BORDERLINE   Hypertension    Migraines    Obesity    Osteopenia    S/P RF ablation operation for arrhythmia    Severe aortic stenosis    Shoulder tendinitis, left  2008    Patient Active Problem List   Diagnosis Date Noted   S/P AVR (aortic valve replacement) 07/27/2020   Severe aortic stenosis 07/07/2020   Hypertension    Hyperlipidemia    History of syncope    GERD (gastroesophageal reflux disease)    Barrett esophagus    S/P RF ablation operation for arrhythmia    Chronic migraine without aura, with intractable migraine, so stated, with status migrainosus 06/18/2020   Primary localized osteoarthritis of right hip 11/23/2019   Primary osteoarthritis of right hip 11/23/2019   Genetic testing 26/41/5830   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   Family history of colon cancer  Family history of melanoma    Family history of ovarian cancer    Family history of lung cancer    Family history of prostate cancer    Family history of liver cancer    Family history of carrier of genetic disease    Atrial flutter ([place]) 01/2008    Past Surgical History:  Procedure Laterality Date   ABDOMINAL HYSTERECTOMY     AORTIC VALVE REPLACEMENT N/A 07/27/2020   Procedure: AORTIC VALVE REPLACEMENT (AVR) USING 23 MM INSPIRIS RESILIA  AORTIC VALVE;  Surgeon: Gaye Pollack, MD;  Location: [place];  Service: Open Heart Surgery;  Laterality: N/A;   BREAST BIOPSY Left    10 yrs +   [place]     COLONOSCOPY  11/03/2012   Procedure: COLONOSCOPY;  Surgeon: Garlan Fair, MD;  [facility]: [facility];  Service: Endoscopy;  Laterality: N/A;   ESOPHAGOGASTRODUODENOSCOPY  11/03/2012   Procedure: ESOPHAGOGASTRODUODENOSCOPY (EGD);  Surgeon: Garlan Fair, MD;  Location: [facility];  Service: Endoscopy;  Laterality: N/A;   implanted loop device     RIGHT HEART CATH AND CORONARY ANGIOGRAPHY N/A 07/07/2020   Procedure: RIGHT HEART CATH AND CORONARY ANGIOGRAPHY;  Surgeon: Sherren Mocha, MD;  Location: [place] [facility];  Service: Cardiovascular;  Laterality: N/A;   TEE WITHOUT  CARDIOVERSION N/A 07/27/2020   Procedure: TRANSESOPHAGEAL ECHOCARDIOGRAM (TEE);  Surgeon: Gaye Pollack, MD;  Location: [place];  Service: Open Heart Surgery;  Laterality: N/A;   TOTAL HIP ARTHROPLASTY Right 11/23/2019   Procedure: RIGHT TOTAL HIP ARTHROPLASTY ANTERIOR APPROACH;  Surgeon: Melrose Nakayama, MD;  Location: [facility];  Service: Orthopedics;  Laterality: Right;    Current Outpatient Medications  Medication Sig Dispense Refill   acetaminophen (TYLENOL) 500 MG tablet Take 500-1,000 mg by mouth every 6 (six) hours as needed (for pain.).     aspirin EC 81 MG tablet Take 1 tablet (81 mg total) by mouth daily. Swallow whole. 90 tablet 3   b complex vitamins tablet Take 1 tablet by mouth daily.     Calcium Carb-Cholecalciferol (CALCIUM+D3 PO) Take 1 tablet by mouth daily.     cetirizine (ZYRTEC) 10 MG tablet TAKE 1 TABLET BY MOUTH AT BEDTIME 30 tablet 5   doxycycline (VIBRAMYCIN) 50 MG capsule Take 2 capsules 1 hour prior to any dental procedures 2 capsule 3   EPINEPHrine 0.3 mg/0.3 mL IJ SOAJ injection Inject 0.3 mg into the muscle as needed for anaphylaxis.     folic acid (FOLVITE) 1 MG tablet Take 1 mg by mouth daily.     Ginkgo Biloba 120 MG TABS Take 120 mg by mouth daily.     Glucosamine-Chondroitin (COSAMIN DS PO) Take 1-2 tablets by mouth See admin instructions. Take 2 tablets by mouth in the morning & take 1 tablet by mouth at night.     Multiple Vitamin (MULTIVITAMIN WITH MINERALS) TABS tablet Take 1 tablet by mouth daily.     Omega-3 1000 MG CAPS Take 2,000 mg by mouth daily.     omeprazole (PRILOSEC) 20 MG capsule Take 20 mg by mouth daily before breakfast.     Polyethyl Glycol-Propyl Glycol (LUBRICANT EYE DROPS) 0.4-0.3 % SOLN Place 1 drop into both eyes 3 (three) times daily as needed (dry/irritated eyes.).     SUMAtriptan (TOSYMRA) 10 MG/ACT SOLN Place 1 spray into the nose every hour as needed. Max 4x a day. 12 each 6  Turmeric 500 MG TABS Take 1,000 mg by mouth daily.      valsartan (DIOVAN) 160 MG tablet Take 1 tablet (160 mg total) by mouth daily. Pt needs to make appt with provider for further refills - 2nd attempt 15 tablet 0   Galcanezumab-gnlm (EMGALITY) 120 MG/ML SOAJ Inject 120 mg into the muscle every 30 (thirty) days. 1 mL 11   SUMAtriptan Succinate (ZEMBRACE SYMTOUCH) 3 MG/0.5ML SOAJ Inject 3 mg into the skin once as needed for up to 1 dose. May repeat in 15 minutes. If symptoms persist, repeat in 2 hours. Max 4 injections daily. 0.5 mL 11   Ubrogepant (UBRELVY) 100 MG TABS Take 100 mg by mouth every 2 (two) hours as needed. Maximum 225m a day. 16 tablet 11   No current facility-administered medications for this visit.    Allergies as of 09/10/2022 - Review Complete 09/10/2022  Allergen Reaction Noted   Mobic [meloxicam] Shortness Of Breath 06/16/2019   Penicillins Anaphylaxis, Hives, and Other (See Comments) 09/05/2011   Codeine  06/16/2019   Nsaids Itching and Nausea And Vomiting 09/05/2011    Vitals: BP (!) 145/92   Pulse 68   Ht _0  (1.651 m)   Wt 198 lb (89.8 kg)   BMI 32.95 kg/m  Last Weight:  Wt Readings from Last 1 Encounters:  09/10/22 198 lb (89.8 kg)   Last Height:   Ht Readings from Last 1 Encounters:  09/10/22 _1  (1.651 m)    Exam: NAD, pleasant                  Speech:    Speech is normal; fluent and spontaneous with normal comprehension.  Cognition:    The patient is oriented to person, place, and time;     recent and remote memory intact;     language fluent;    Cranial Nerves:    The pupils are equal, round, and reactive to light.Trigeminal sensation is intact and the muscles of mastication are normal. The face is symmetric. The palate elevates in the midline. Hearing intact. Voice is normal. Shoulder shrug is normal. The tongue has normal motion without fasciculations.   Coordination:  No dysmetria  Motor Observation:    No asymmetry, no atrophy, and no involuntary movements noted. Tone:    Normal  muscle tone.     Strength:    Strength is V/V in the upper and lower limbs.      Sensation: intact to LT      Assessment/Plan:  Really lovely 66year old with worsening headaches likely progression of chronic migraines   -Preventative: restart Emgality injection every 30 days -Rescue: Continue Ubrelvy as needed, discussed trialing Tosmyra for headaches upon awakening but is hesitant to try or Zembrace. [facility]    Follow up in 1 year or call earlier if needed   No orders of the defined types were placed in this encounter.   Meds ordered this encounter  Medications   Galcanezumab-gnlm (EMGALITY) 120 MG/ML SOAJ    Sig: Inject 120 mg into the muscle every 30 (thirty) days.    Dispense:  1 mL    Refill:  11   Ubrogepant (UBRELVY) 100 MG TABS    Sig: Take 100 mg by mouth every 2 (two) hours as needed. Maximum 2020ma day.    Dispense:  16 tablet    Refill:  11   SUMAtriptan Succinate (ZEMBRACE SYMTOUCH) 3 MG/0.5ML SOAJ    Sig:  Inject 3 mg into the skin once as needed for up to 1 dose. May repeat in 15 minutes. If symptoms persist, repeat in 2 hours. Max 4 injections daily.    Dispense:  0.5 mL    Refill:  11     I spent 21 minutes of face-to-face and non-face-to-face time with patient.  This included previsit chart review, lab review, study review, order entry, electronic health record documentation, patient education and discussion regarding migraine headaches and treatment plan and answered all questions to patient's satisfaction  Frann Rider, [facility]  [facility] [address] [place], [place] [address]  Phone [phone] Fax [phone] Note: This document was prepared with digital dictation and possible smart phrase technology. Any transcriptional errors that result from this process are unintentional.

## 2022-09-10 ENCOUNTER — Ambulatory Visit (INDEPENDENT_AMBULATORY_CARE_PROVIDER_SITE_OTHER): Payer: Medicare Other | Admitting: Adult Health

## 2022-09-10 ENCOUNTER — Encounter: Payer: Self-pay | Admitting: Adult Health

## 2022-09-10 DIAGNOSIS — R519 Headache, unspecified: Secondary | ICD-10-CM

## 2022-09-10 DIAGNOSIS — R5383 Other fatigue: Secondary | ICD-10-CM

## 2022-09-10 DIAGNOSIS — G43711 Chronic migraine without aura, intractable, with status migrainosus: Secondary | ICD-10-CM | POA: Diagnosis not present

## 2022-09-10 MED ORDER — UBRELVY 100 MG PO TABS: 100.0000 mg | ORAL_TABLET | ORAL | 11 refills | Status: DC | PRN

## 2022-09-10 MED ORDER — ZEMBRACE SYMTOUCH 3 MG/0.5ML ~~LOC~~ SOAJ: 3.0000 mg | Freq: Once | SUBCUTANEOUS | 11 refills | Status: DC | PRN

## 2022-09-10 MED ORDER — EMGALITY 120 MG/ML ~~LOC~~ SOAJ: 120.0000 mg | SUBCUTANEOUS | 11 refills | Status: DC

## 2022-09-10 NOTE — Patient Instructions (Addendum)
Your Plan:  Continue Emgality monthly injection  Continue Ubrelvy for rescue medication Can use sumatriptan nasal spray or injection for rescue medication as well, just do not use together    Follow up in 1 year or call earlier if needed     Thank you for coming to see [place] at [facility]. I hope we have been able to provide you high quality care today.  You may receive a patient satisfaction survey over the next few weeks. We would appreciate your feedback and comments so that we may continue to improve ourselves and the health of our patients.

## 2022-09-11 ENCOUNTER — Telehealth: Payer: Self-pay

## 2022-09-11 NOTE — Telephone Encounter (Signed)
Approved Effective from 09/11/2022 through 09/12/2023

## 2022-09-11 NOTE — Telephone Encounter (Signed)
PA submitted via CMM Key: [id number]  [employer] will review the request and notify you of the determination decision directly, typically within 3 business days of your submission and once all necessary information is received

## 2022-09-16 NOTE — Progress Notes (Unsigned)
Cardiology Office Note   Date:  09/17/2022   ID:  Rebecca Farley, DOB 1956/02/07, MRN [id number]  PCP:  Kirby Funk, MD    No chief complaint on file.  S/p AVR  Wt Readings from Last 3 Encounters:  09/17/22 199 lb (90.3 kg)  09/10/22 198 lb (89.8 kg)  01/29/22 201 lb 12.8 oz (91.5 kg)       History of Present Illness: Rebecca Farley is a 66 y.o. female  With a h/o atrial flutter and AS.  Works at [employer].   She had a h/o atrial flutter in the past and had an ablation in [place] many years ago.  Sx of atrial flutter were extreme dizziness.  No palpitations.    She had a heart murmur on her routine exam and was sent for an echo in 2020.  She was not having any new symptoms.   She does report some DOE for 15 years.  She has had some dizzy spells for 15 years as well.  It can happen even when she was sitting down.  She wore a monitor many years ago and this was ewhen the AFlutter was diagnosed.  HR was 300 bpm per her report.   She had severe AS treated with SAVR in 2021 (Aortic valve replacement using a 23 mm [employer] INSPIRIS RESILIA valve.)  Postop, she had some dizzy spells. In 2022, "Fell after tripping and broke knee cap and bone on forehead. Seeing ortho."  Did not need surgery.   In Dec 23, ran out of BP meds. BP was well controlled while on meds, systolic in the 120-130s systolic.    Denies : Chest pain. Dizziness. Leg edema. Nitroglycerin use. Orthopnea. Palpitations. Paroxysmal nocturnal dyspnea. Shortness of breath. Syncope.     Past Medical History:  Diagnosis Date   Atrial flutter ([facility]) 01/2008   [place]IO[place]QUENCY [place]BLATION [place]   Barrett esophagus    Bee sting allergy    Diverticulitis    DJD (degenerative joint disease) of knee    KNEES, BACK (LUMBAR FACET DISEASE) AND HIPS, DALLDORF.   GERD (gastroesophageal reflux disease)    Hiatal hernia    History of syncope    RECURRENT   Hyperlipidemia    BORDERLINE   Hypertension     Migraines    Obesity    Osteopenia    S/P RF ablation operation for arrhythmia    Severe aortic stenosis    Shoulder tendinitis, left 2008    Past Surgical History:  Procedure Laterality Date   ABDOMINAL HYSTERECTOMY     AORTIC VALVE REPLACEMENT N/A 07/27/2020   Procedure: AORTIC VALVE REPLACEMENT (AVR) USING 23 MM INSPIRIS RESILIA  AORTIC VALVE;  Surgeon: Alleen Borne, MD;  [facility];  Service: Open Heart Surgery;  Laterality: N/A;   BREAST BIOPSY Left    10 yrs +   CARDIAC CATHETERIZATION     CESAREAN SECTION     1979, 1984   CHOLECYSTECTOMY     COLONOSCOPY  11/03/2012   Procedure: COLONOSCOPY;  Surgeon: Charolett Bumpers, MD;  Location: [facility];  Service: Endoscopy;  Laterality: N/A;   ESOPHAGOGASTRODUODENOSCOPY  11/03/2012   Procedure: ESOPHAGOGASTRODUODENOSCOPY (EGD);  Surgeon: Charolett Bumpers, MD;  Location: [facility];  Service: Endoscopy;  Laterality: N/A;   implanted loop device     RIGHT HEART CATH AND CORONARY ANGIOGRAPHY N/A 07/07/2020   Procedure: RIGHT HEART CATH AND CORONARY ANGIOGRAPHY;  Surgeon: Tonny Bollman, MD;  [facility]  INV[facility]IVE CV [facility];  Service: Cardiovascular;  Laterality: N/A;   TEE WITHOUT CARDIOVERSION N/A 07/27/2020   Procedure: TRANSESOPHAGEAL ECHOCARDIOGRAM (TEE);  Surgeon: Alleen Borne, MD;  [facility];  Service: Open Heart Surgery;  Laterality: N/A;   TOTAL HIP ARTHROPLASTY Right 11/23/2019   Procedure: RIGHT TOTAL HIP ARTHROPLASTY ANTERIOR APPROACH;  Surgeon: Marcene Corning, MD;  [facility];  Service: Orthopedics;  Laterality: Right;     Current Outpatient Medications  Medication Sig Dispense Refill   acetaminophen (TYLENOL) 500 MG tablet Take 500-1,000 mg by mouth every 6 (six) hours as needed (for pain.).     aspirin EC 81 MG tablet Take 1 tablet (81 mg total) by mouth daily. Swallow whole. 90 tablet 3   b complex vitamins tablet Take 1 tablet by mouth daily.     Calcium Carb-Cholecalciferol (CALCIUM+D3 PO) Take  1 tablet by mouth daily.     cetirizine (ZYRTEC) 10 MG tablet TAKE 1 TABLET BY MOUTH AT BEDTIME 30 tablet 5   doxycycline (VIBRAMYCIN) 50 MG capsule Take 2 capsules 1 hour prior to any dental procedures 2 capsule 3   EPINEPHrine 0.3 mg/0.3 mL IJ SOAJ injection Inject 0.3 mg into the muscle as needed for anaphylaxis.     folic acid (FOLVITE) 1 MG tablet Take 1 mg by mouth daily.     Galcanezumab-gnlm (EMGALITY) 120 MG/ML SOAJ Inject 120 mg into the muscle every 30 (thirty) days. 1 mL 11   Ginkgo Biloba 120 MG TABS Take 120 mg by mouth daily.     Glucosamine-Chondroitin (COSAMIN DS PO) Take 1-2 tablets by mouth See admin instructions. Take 2 tablets by mouth in the morning & take 1 tablet by mouth at night.     Multiple Vitamin (MULTIVITAMIN WITH MINERALS) TABS tablet Take 1 tablet by mouth daily.     Omega-3 1000 MG CAPS Take 2,000 mg by mouth daily.     omeprazole (PRILOSEC) 20 MG capsule Take 20 mg by mouth daily before breakfast.     Polyethyl Glycol-Propyl Glycol (LUBRICANT EYE DROPS) 0.4-0.3 % SOLN Place 1 drop into both eyes 3 (three) times daily as needed (dry/irritated eyes.).     SUMAtriptan Succinate (ZEMBRACE SYMTOUCH) 3 MG/0.5ML SOAJ Inject 3 mg into the skin once as needed for up to 1 dose. May repeat in 15 minutes. If symptoms persist, repeat in 2 hours. Max 4 injections daily. 0.5 mL 11   Turmeric 500 MG TABS Take 1,000 mg by mouth daily.     Ubrogepant (UBRELVY) 100 MG TABS Take 100 mg by mouth every 2 (two) hours as needed. Maximum 200mg  a day. 16 tablet 11   valsartan (DIOVAN) 160 MG tablet Take 1 tablet (160 mg total) by mouth daily. Pt needs to make appt with provider for further refills - 2nd attempt 15 tablet 0   No current facility-administered medications for this visit.    Allergies:   Meloxicam, Penicillins, Codeine, and Nsaids    Social History:  The patient  reports that she has never smoked. She has never used smokeless tobacco. She reports current alcohol use. She  reports that she does not use drugs.   Family History:  The patient's family history includes Aneurysm in her mother; Colon cancer (age of onset: 64) in her sister; Colon cancer (age of onset: 15) in her brother; Emphysema in her father; Heart failure in her mother; Hypertension in her mother; Idiopathic pulmonary fibrosis in her brother; Kidney failure in her mother; Liver cancer in her maternal  uncle; Lung cancer in her father, maternal uncle, mother, and paternal uncle; Melanoma in her sister; Melanoma (age of onset: 49) in her sister; Other in her sister, sister, sister, and sister; Ovarian cancer in her paternal grandmother; Prostate cancer in her father.    ROS:  Please see the history of present illness.   Otherwise, review of systems are positive for knee pain.   All other systems are reviewed and negative.    PHYSICAL EXAM: VS:  BP (!) 160/108   Pulse 74   Ht 5\' 5"  (1.651 m)   Wt 199 lb (90.3 kg)   SpO2 94%   BMI 33.12 kg/m  , BMI Body mass index is 33.12 kg/m. GEN: Well nourished, well developed, in no acute distress HEENT: normal Neck: no JVD, carotid bruits, or masses Cardiac: RRR; no murmurs, rubs, or gallops,no edema  Respiratory:  clear to auscultation bilaterally, normal work of breathing GI: soft, nontender, nondistended, + BS MS: no deformity or atrophy Skin: warm and dry, no rash Neuro:  Strength and sensation are intact Psych: euthymic mood, full affect   EKG:   The ekg ordered today demonstrates NSR, no ST changes   Recent Labs: No results found for requested labs within last 365 days.   Lipid Panel No results found for: "CHOL", "TRIG", "HDL", "CHOLHDL", "VLDL", "LDLCALC", "LDLDIRECT"   Other studies Reviewed: Additional studies/ records that were reviewed today with results demonstrating: echo 1/22: Normal LV function and normal aortic valve function.  She needs SBE prophylaxis for dental treatments. .   ASSESSMENT AND PLAN:  Aortic valve stenosis:  Status post AVR.  Needs SBE prophylaxis.  Using doxycycline- Refill.  Atrial flutter: Status post ablation in [place] many years ago. In NSR.  No sx of flutter.  Hypertension: refill valsartan.  Whole food, plant based diet. High fiber  Foods. avoid processed foods.  Exercise target as noted below.  Needs repeat lipids at her next physical.  Seeing new PMD after Dr. Weimar retired.    Current medicines are reviewed at length with the patient today.  The patient concerns regarding her medicines were addressed.  The following changes have been made:  No change  Labs/ tests ordered today include:  No orders of the defined types were placed in this encounter.   Recommend 150 minutes/week of aerobic exercise Low fat, low carb, high fiber diet recommended  Disposition:   FU in 1 year   Signed, Valentina Lucks, MD  09/17/2022 11:03 AM    [facility] [address] [place], [place], [place]  [place] Phone: [phone]; Fax: [phone]

## 2022-09-17 ENCOUNTER — Encounter: Payer: Self-pay | Admitting: Interventional Cardiology

## 2022-09-17 ENCOUNTER — Ambulatory Visit: Payer: Medicare Other | Attending: Interventional Cardiology | Admitting: Interventional Cardiology

## 2022-09-17 VITALS — BP 160/108 | HR 74 | Ht 65.0 in | Wt 199.0 lb

## 2022-09-17 DIAGNOSIS — I10 Essential (primary) hypertension: Secondary | ICD-10-CM | POA: Diagnosis not present

## 2022-09-17 DIAGNOSIS — I4892 Unspecified atrial flutter: Secondary | ICD-10-CM | POA: Diagnosis not present

## 2022-09-17 DIAGNOSIS — I350 Nonrheumatic aortic (valve) stenosis: Secondary | ICD-10-CM | POA: Diagnosis not present

## 2022-09-17 MED ORDER — DOXYCYCLINE HYCLATE 50 MG PO CAPS: ORAL_CAPSULE | ORAL | 3 refills | Status: DC

## 2022-09-17 MED ORDER — VALSARTAN 160 MG PO TABS: 160.0000 mg | ORAL_TABLET | Freq: Every day | ORAL | 3 refills | Status: DC

## 2022-09-17 NOTE — Patient Instructions (Signed)
Medication Instructions:  Your physician recommends that you continue on your current medications as directed. Please refer to the Current Medication list given to you today.  *If you need a refill on your cardiac medications before your next appointment, please call your pharmacy*   Lab Work: none If you have labs (blood work) drawn today and your tests are completely normal, you will receive your results only by: MyChart Message (if you have MyChart) OR A paper copy in the mail If you have any lab test that is abnormal or we need to change your treatment, we will call you to review the results.   Testing/Procedures: none   Follow-Up: At [facility], you and your health needs are our priority.  As part of our continuing mission to provide you with exceptional heart care, we have created designated Provider Care Teams.  These Care Teams include your primary Cardiologist (physician) and Advanced Practice Providers (APPs -  Physician Assistants and Nurse Practitioners) who all work together to provide you with the care you need, when you need it.  We recommend signing up for the patient portal called "MyChart".  Sign up information is provided on this After Visit Summary.  MyChart is used to connect with patients for Virtual Visits (Telemedicine).  Patients are able to view lab/test results, encounter notes, upcoming appointments, etc.  Non-urgent messages can be sent to your provider as well.   To learn more about what you can do with MyChart, go to [email or web address].    Your next appointment:   12 month(s)  The format for your next appointment:   In Person  Provider:   Jayadeep Varanasi, MD     Other Instructions    Important Information About Sugar       

## 2022-11-11 ENCOUNTER — Telehealth: Payer: Self-pay | Admitting: Adult Health

## 2022-11-11 NOTE — Telephone Encounter (Signed)
Pt states she was supposed to get her Galcanezumab-gnlm (EMGALITY) 120 MG/ML SOAJ , over the weekend.  She states the pharmacy informed her they are waiting on an authorization from Dr Jaynee Eagles, please call with assistance on this request.

## 2022-11-11 NOTE — Telephone Encounter (Signed)
Can PA for Emgality please be submitted as urgent?

## 2022-11-14 NOTE — Telephone Encounter (Signed)
Patient Advocate Encounter  Prior Authorization for [employer] 120MG /ML auto-injectors (migraine) has been approved through [employer] Part D.     Effective: 11-14-2022 to 11-15-2023

## 2022-11-14 NOTE — Telephone Encounter (Signed)
Patient Advocate Encounter   Received notification that prior authorization for Emgality 120MG /ML auto-injectors (migraine) is required.   PA submitted on 11/14/2022 Key [id number] Status is pending       Lyndel Safe, [place] Patient Advocate Specialist [facility] Patient Advocate Team Direct Number: [phone]  Fax: [phone]

## 2022-12-20 ENCOUNTER — Other Ambulatory Visit: Payer: Self-pay | Admitting: Internal Medicine

## 2022-12-20 DIAGNOSIS — R053 Chronic cough: Secondary | ICD-10-CM

## 2023-01-15 ENCOUNTER — Other Ambulatory Visit (HOSPITAL_COMMUNITY): Payer: Self-pay

## 2023-01-15 MED ORDER — OMRON 3 SERIES BP MONITOR DEVI
0 refills | Status: AC
  Filled 2023-01-15: qty 1, 1d supply, fill #0

## 2023-01-21 ENCOUNTER — Other Ambulatory Visit: Payer: Self-pay | Admitting: Internal Medicine

## 2023-01-21 DIAGNOSIS — R053 Chronic cough: Secondary | ICD-10-CM

## 2023-03-07 ENCOUNTER — Other Ambulatory Visit: Payer: Self-pay | Admitting: Internal Medicine

## 2023-03-07 DIAGNOSIS — R053 Chronic cough: Secondary | ICD-10-CM

## 2023-04-21 ENCOUNTER — Other Ambulatory Visit (HOSPITAL_COMMUNITY): Payer: Self-pay

## 2023-04-21 MED ORDER — OMEPRAZOLE 20 MG PO CPDR
20.0000 mg | DELAYED_RELEASE_CAPSULE | Freq: Every day | ORAL | 4 refills | Status: DC
  Filled 2023-04-21: qty 30, 30d supply, fill #0

## 2023-04-29 ENCOUNTER — Other Ambulatory Visit (HOSPITAL_COMMUNITY): Payer: Self-pay

## 2023-05-12 ENCOUNTER — Other Ambulatory Visit: Payer: Self-pay | Admitting: Physician Assistant

## 2023-05-12 DIAGNOSIS — Z1231 Encounter for screening mammogram for malignant neoplasm of breast: Secondary | ICD-10-CM

## 2023-05-28 ENCOUNTER — Ambulatory Visit
Admission: RE | Admit: 2023-05-28 | Discharge: 2023-05-28 | Disposition: A | Discharge status: Home / Self Care | Payer: Medicare Other | Source: Ambulatory Visit | Attending: Physician Assistant | Admitting: Physician Assistant

## 2023-05-28 DIAGNOSIS — Z1231 Encounter for screening mammogram for malignant neoplasm of breast: Secondary | ICD-10-CM

## 2023-07-05 ENCOUNTER — Other Ambulatory Visit: Payer: Self-pay | Admitting: Physician Assistant

## 2023-07-05 DIAGNOSIS — M8580 Other specified disorders of bone density and structure, unspecified site: Secondary | ICD-10-CM

## 2023-07-21 ENCOUNTER — Other Ambulatory Visit (HOSPITAL_COMMUNITY): Payer: Self-pay

## 2023-07-21 MED ORDER — INFLUENZA VAC A&B SURF ANT ADJ 0.5 ML IM SUSY
PREFILLED_SYRINGE | INTRAMUSCULAR | 0 refills | Status: DC
  Filled 2023-07-21: qty 0.5, 1d supply, fill #0

## 2023-07-21 MED ORDER — COVID-19 MRNA VAC-TRIS(PFIZER) 30 MCG/0.3ML IM SUSY
PREFILLED_SYRINGE | INTRAMUSCULAR | 0 refills | Status: DC
  Filled 2023-07-21: qty 0.3, 1d supply, fill #0

## 2023-07-22 ENCOUNTER — Other Ambulatory Visit (HOSPITAL_COMMUNITY): Payer: Self-pay

## 2023-07-23 ENCOUNTER — Other Ambulatory Visit (HOSPITAL_COMMUNITY): Payer: Self-pay

## 2023-09-05 ENCOUNTER — Other Ambulatory Visit: Payer: Self-pay | Admitting: Interventional Cardiology

## 2023-09-11 ENCOUNTER — Ambulatory Visit: Payer: Medicare Other | Admitting: Adult Health

## 2023-09-11 ENCOUNTER — Other Ambulatory Visit (HOSPITAL_COMMUNITY): Payer: Self-pay

## 2023-09-11 ENCOUNTER — Telehealth: Payer: Self-pay

## 2023-09-11 ENCOUNTER — Encounter: Payer: Self-pay | Admitting: Adult Health

## 2023-09-11 VITALS — BP 129/84 | HR 72 | Ht 65.0 in | Wt 194.0 lb

## 2023-09-11 DIAGNOSIS — R519 Headache, unspecified: Secondary | ICD-10-CM

## 2023-09-11 DIAGNOSIS — G43711 Chronic migraine without aura, intractable, with status migrainosus: Secondary | ICD-10-CM

## 2023-09-11 DIAGNOSIS — Z9189 Other specified personal risk factors, not elsewhere classified: Secondary | ICD-10-CM

## 2023-09-11 MED ORDER — UBRELVY 100 MG PO TABS: 100.0000 mg | ORAL_TABLET | ORAL | 11 refills | Status: AC | PRN

## 2023-09-11 MED ORDER — AJOVY 225 MG/1.5ML ~~LOC~~ SOAJ: 225.0000 mg | SUBCUTANEOUS | 11 refills | Status: DC

## 2023-09-11 NOTE — Patient Instructions (Addendum)
Your Plan:  Switch to Ajovy injections - if not as beneficial as Emgality, can return back to [employer] or consider switching to [facility] as needed  Will place referral to our sleep clinic - you will be called to schedule initial consult visit      Follow up in 6 months or call earlier if needed     Thank you for coming to see [place] at [facility]. I hope we have been able to provide you high quality care today.  You may receive a patient satisfaction survey over the next few weeks. We would appreciate your feedback and comments so that we may continue to improve ourselves and the health of our patients.

## 2023-09-11 NOTE — Telephone Encounter (Signed)
Patient called in and advised that her insurance will not cover Fremanezumab-vfrm (AJOVY) 225 MG/1.5ML SOAJ   She is asking if it is ok for her to continue [employer], and if so, that a refill of her Emgality be sent to [facility] - [place] - [address]

## 2023-09-11 NOTE — Progress Notes (Signed)
[facility]    Provider:  Dr Lucia Gaskins Requesting Provider: Kirby Funk, MD Primary Care Provider:  Delma Officer, [place]  CC:  migraines  HPI:  Update 09/11/2023 JM: Returns for yearly migraine follow-up.  She has remained on Emgality but typically has about 1 migraine per week for the first 3 weeks after injection and then almost daily for 1 week prior to next injection although migraines are much less severe. Use of Ubrelvy with benefit, can have visual auras prior to migraine but this migraine is usually mild. Her more severe migraines are present upon awakening. Has chronic hx of difficulty sleeping, daytime fatigue and snoring. She was previously referred to [facility] sleep clinic in 2021 but due to scheduling conflicts, this was never pursued. She questions if this could be pursued now.    History provided for reference purposes only Update 09/09/2022 JM: Patient returns for migraine follow-up after prior visit with Dr. Lucia Gaskins over 1 year ago.  Unfortunately, she ran out of her Emgality refills with last injection in October and has been experiencing worsening migraines since that time.  Prior to running out, she was experiencing about 2-3 migraines per month.  Will use Ubrelvy with benefit at times but will not work well with morning headache.  She has tried sumatriptan injection but unable to remember if beneficial.  She has not yet trialed sumatriptan nasal spray.  Update 05/30/2021 Dr. Lucia Gaskins; sHE WAS STARTED ON eMGALITY AND NURTEC ABOUT A YEAR AGO. Interval history 05/30/2021: She is doing great on emgality. As preventative. The problem is acutely, waking up with a headache, when it is full blown. Nurtec works in a few hours IF the headache isn;t on waking up. So will try ubrelvy. Tosymra. And next go to injections of imitrex.  Try Bernita Raisin acutely Try Tosymra acutely Can also try imitrex injection (but not at the same time as a tosymra spray)  Consult visit 06/15/2020 Dr.  Lucia Gaskins: DOROTHEE STEM is a 67 y.o. female here as requested by Kirby Funk, MD for migraines. I reviewed Dr. Jone Baseman notes: She has a past medical history of degenerative joint disease of the hip, osteopenia, severe aortic stenosis, migraine headache, degenerative joint disease of the knee, a flutter, hypertension, GERD, obesity and other chronic pain.  It appears at last appointment she was treated with imipramine 25 mg and baclofen 10 mg possibly for management of migraine headaches, also on valsartan hydrochlorothiazide for blood pressure however this can be used for migraine management, she follows with cardiology for severe aortic stenosis, imipramine for prevention with headaches but requested neurology again (not sure where she had been to prior.  She reported to Dr. Valentina Lucks that she had more headaches recently, she moved into a new home in the setting of stress, sometimes migraines lasting 2 to 3 days, taking baclofen for acute headaches and imipramine preventively, tries to stay healthy, she is exercising regularly, not been a good sleeper for years, she saw a headache wellness center once in the past but did not go back.  I also reviewed Dr. Jone Baseman labs which were collected November 04, 2019 which showed a normal BMP with BUN 12 and creatinine 0.76, unremarkable CBC with differential,   She is here with worsening migraines. She snores and wakes herself up. She has morning headaches, recent fatigue. Diagnosed with migraines 36 years ago. After she had her daughter. Migraines unilateral used to be always behind the right eye, pulsating, pounding, throbbing, severe, now it is behind the left  eye sometimes, then it may radiate, the headaches in the morning are less migrainous more holocephalic. +light/sound sensitivity, nausea, movement makes it worse, the headache is positional and exertional, daily, worsening, continuous all day long, can be severe to the point she can vomit, ongoing and worsening  the last 6 months, they can last 3 days. No other focal neurologic deficits, associated symptoms, inciting events or modifiable factors.  Reviewed notes, labs and imaging from outside physicians, which showed: see above  Review of Systems: Patient complains of symptoms per HPI as well as the following symptoms: stress . Pertinent negatives and positives per HPI. All others negative    Social History   Socioeconomic History   Marital status: Widowed    Spouse name: Not on file   Number of children: 3   Years of education: 12   Highest education level: High school graduate  Occupational History   Not on file  Tobacco Use   Smoking status: Never   Smokeless tobacco: Never  Vaping Use   Vaping status: Never Used  Substance and Sexual Activity   Alcohol use: Yes    Comment: OCCASIONALY   Drug use: No   Sexual activity: Not Currently    Birth control/protection: Surgical  Other Topics Concern   Not on file  Social History Narrative   Not on file   Social Determinants of Health   Financial Resource Strain: Not on file  Food Insecurity: Not on file  Transportation Needs: Not on file  Physical Activity: Not on file  Stress: Not on file  Social Connections: Not on file  Intimate Partner Violence: Not on file    Family History  Problem Relation Age of Onset   Hypertension Mother    Heart failure Mother    Aneurysm Mother    Kidney failure Mother    Lung cancer Mother    Emphysema Father    Prostate cancer Father    Lung cancer Father    Colon cancer Sister 21       dx 3 times; Lynch syndrome (PMS2)   Melanoma Sister 45   Other Sister        genetic testing - PMS2+, BRIP1+, MITF+   Melanoma Sister    Other Sister        genetic testing - PMS2+   Other Sister        genetic testing - BRIP1+, PMS2-, MITF-   Other Sister        genetic testing - BRIP1+, MITF+, PMS2-   Idiopathic pulmonary fibrosis Brother    Colon cancer Brother 89   Lung cancer Maternal Uncle     Liver cancer Maternal Uncle    Lung cancer Paternal Uncle    Ovarian cancer Paternal Grandmother        died early 75s   Migraines Neg Hx    Sleep apnea Neg Hx     Past Medical History:  Diagnosis Date   Atrial flutter (HCC) 01/2008   RADIOFREQUENCY ABLATION [place]   Barrett esophagus    Bee sting allergy    Diverticulitis    DJD (degenerative joint disease) of knee    KNEES, BACK (LUMBAR FACET DISEASE) AND HIPS, DALLDORF.   GERD (gastroesophageal reflux disease)    Hiatal hernia    History of syncope    RECURRENT   Hyperlipidemia    BORDERLINE   Hypertension    Migraines    Obesity    Osteopenia    S/P RF ablation  operation for arrhythmia    Severe aortic stenosis    Shoulder tendinitis, left 2008    Patient Active Problem List   Diagnosis Date Noted   S/P AVR (aortic valve replacement) 07/27/2020   Severe aortic stenosis 07/07/2020   Hypertension    Hyperlipidemia    History of syncope    GERD (gastroesophageal reflux disease)    Barrett esophagus    S/P RF ablation operation for arrhythmia    Chronic migraine without aura, with intractable migraine, so stated, with status migrainosus 06/18/2020   Primary localized osteoarthritis of right hip 11/23/2019   Primary osteoarthritis of right hip 11/23/2019   Genetic testing 06/01/2019   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   Family history of colon cancer    Family history of melanoma    Family history of ovarian cancer    Family history of lung cancer    Family history of prostate cancer    Family history of liver cancer    Family history of carrier of genetic disease    Atrial flutter (HCC) 01/2008    Past Surgical History:  Procedure Laterality Date   ABDOMINAL HYSTERECTOMY     AORTIC VALVE REPLACEMENT N/A 07/27/2020   Procedure: AORTIC VALVE REPLACEMENT (AVR) USING 23 MM INSPIRIS RESILIA  AORTIC VALVE;  Surgeon: Alleen Borne, MD;  Location: [facility] OR;  Service: Open Heart Surgery;   Laterality: N/A;   BREAST BIOPSY Left    10 yrs +   CARDIAC CATHETERIZATION     CESAREAN SECTION     1979, 1984   CHOLECYSTECTOMY     COLONOSCOPY  11/03/2012   Procedure: COLONOSCOPY;  Surgeon: Charolett Bumpers, MD;  Location: [facility] ENDOSCOPY;  Service: Endoscopy;  Laterality: N/A;   ESOPHAGOGASTRODUODENOSCOPY  11/03/2012   Procedure: ESOPHAGOGASTRODUODENOSCOPY (EGD);  Surgeon: Charolett Bumpers, MD;  Location: [facility];  Service: Endoscopy;  Laterality: N/A;   implanted loop device     RIGHT HEART CATH AND CORONARY ANGIOGRAPHY N/A 07/07/2020   Procedure: RIGHT HEART CATH AND CORONARY ANGIOGRAPHY;  Surgeon: Tonny Bollman, MD;  Location: [facility] INVASIVE CV LAB;  Service: Cardiovascular;  Laterality: N/A;   TEE WITHOUT CARDIOVERSION N/A 07/27/2020   Procedure: TRANSESOPHAGEAL ECHOCARDIOGRAM (TEE);  Surgeon: Alleen Borne, MD;  Location[facility] OR;  Service: Open Heart Surgery;  Laterality: N/A;   TOTAL HIP ARTHROPLASTY Right 11/23/2019   Procedure: RIGHT TOTAL HIP ARTHROPLASTY ANTERIOR APPROACH;  Surgeon: Marcene Corning, MD;  Location: [facility] ORS;  Service: Orthopedics;  Laterality: Right;    Current Outpatient Medications  Medication Sig Dispense Refill   acetaminophen (TYLENOL) 500 MG tablet Take 500-1,000 mg by mouth every 6 (six) hours as needed (for pain.).     aspirin EC 81 MG tablet Take 1 tablet (81 mg total) by mouth daily. Swallow whole. 90 tablet 3   Blood Pressure Monitoring (OMRON 3 SERIES BP MONITOR) DEVI UES AS DIRECTED 1 each 0   Calcium Carb-Cholecalciferol (CALCIUM+D3 PO) Take 1 tablet by mouth daily.     cetirizine (ZYRTEC) 10 MG tablet TAKE 1 TABLET BY MOUTH AT BEDTIME 30 tablet 0   COVID-19 mRNA vaccine, Pfizer, (COMIRNATY) syringe Inject into the muscle. 0.3 mL 0   doxycycline (VIBRAMYCIN) 50 MG capsule Take 2 capsules 1 hour prior to any dental procedures 2 capsule 3   EPINEPHrine 0.3 mg/0.3 mL IJ SOAJ injection Inject 0.3 mg into the muscle as needed for anaphylaxis.      folic acid (FOLVITE) 1 MG tablet Take 1 mg  by mouth daily.     Fremanezumab-vfrm (AJOVY) 225 MG/1.5ML SOAJ Inject 225 mg into the skin every 30 (thirty) days. 1.68 mL 11   Ginkgo Biloba 120 MG TABS Take 120 mg by mouth daily.     Glucosamine-Chondroitin (COSAMIN DS PO) Take 1-2 tablets by mouth See admin instructions. Take 2 tablets by mouth in the morning & take 1 tablet by mouth at night.     influenza vaccine adjuvanted (FLUAD) 0.5 ML injection Inject into the muscle. 0.5 mL 0   Multiple Vitamin (MULTIVITAMIN WITH MINERALS) TABS tablet Take 1 tablet by mouth daily.     Omega-3 1000 MG CAPS Take 2,000 mg by mouth daily.     omeprazole (PRILOSEC) 20 MG capsule TAKE 1 CAPSULE BY MOUTH ONCE DAILY during THE MORNING, 30 MINUTES BEFORE MEAL 30 capsule 4   Polyethyl Glycol-Propyl Glycol (LUBRICANT EYE DROPS) 0.4-0.3 % SOLN Place 1 drop into both eyes 3 (three) times daily as needed (dry/irritated eyes.).     SUMAtriptan Succinate (ZEMBRACE SYMTOUCH) 3 MG/0.5ML SOAJ Inject 3 mg into the skin once as needed for up to 1 dose. May repeat in 15 minutes. If symptoms persist, repeat in 2 hours. Max 4 injections daily. 0.5 mL 11   Turmeric 500 MG TABS Take 1,000 mg by mouth daily.     Ubrogepant (UBRELVY) 100 MG TABS Take 100 mg by mouth every 2 (two) hours as needed. Maximum 200mg  a day. 16 tablet 11   valsartan (DIOVAN) 160 MG tablet Take 1 tablet by mouth once daily 30 tablet 0   b complex vitamins tablet Take 1 tablet by mouth daily.     omeprazole (PRILOSEC) 20 MG capsule Take 20 mg by mouth daily before breakfast.     No current facility-administered medications for this visit.    Allergies as of 09/11/2023 - Review Complete 09/11/2023  Allergen Reaction Noted   Meloxicam Shortness Of Breath and Other (See Comments) 06/07/2015   Penicillins Anaphylaxis, Hives, and Other (See Comments) 09/05/2011   Codeine Nausea And Vomiting 06/16/2019   Nsaids Itching and Nausea And Vomiting 09/05/2011     Vitals: BP 129/84   Pulse 72   Ht 5\' 5"  (1.651 m)   Wt 194 lb (88 kg)   BMI 32.28 kg/m  Last Weight:  Wt Readings from Last 1 Encounters:  09/11/23 194 lb (88 kg)   Last Height:   Ht Readings from Last 1 Encounters:  09/11/23 5\' 5"  (1.651 m)    Exam: NAD, very pleasant middle-age Caucasian female, pleasant                  Speech:    Speech is normal; fluent and spontaneous with normal comprehension.  Cognition:    The patient is oriented to person, place, and time;     recent and remote memory intact;     language fluent;    Cranial Nerves:    The pupils are equal, round, and reactive to light.Trigeminal sensation is intact and the muscles of mastication are normal. The face is symmetric. The palate elevates in the midline. Hearing intact. Voice is normal. Shoulder shrug is normal. The tongue has normal motion without fasciculations.   Coordination:  No dysmetria  Motor Observation:    No asymmetry, no atrophy, and no involuntary movements noted. Tone:    Normal muscle tone.     Strength:    Strength is V/V in the upper and lower limbs.      Sensation: intact to  LT        Assessment/Plan:  KIYO TIFFIN is a 67 year old with chronic migraine headaches. She is currently on Emgality and experiencing 1 migraine/week for the first 3 weeks after injection and almost daily the week prior to next injection although migraines less severe. Use of Ubrelvy typically with benefit. More severe migraines occurs upon awakening. Also notes insomnia, daytime fatigue and snoring.   -Preventative: Recommend trying Ajovy for preventative. If no benefit, can consider going back to [facility] or trying Vyepti.  Would not recommend Aimovig due to issues with constipation -Rescue: Continue Ubrelvy as needed -Referral placed to [facility] sleep clinic to evaluate for possible underlying sleep apnea     Follow up in 6 months or call earlier if needed   Orders Placed This Encounter   Procedures   Ambulatory referral to Sleep Studies    Meds ordered this encounter  Medications   Fremanezumab-vfrm (AJOVY) 225 MG/1.5ML SOAJ    Sig: Inject 225 mg into the skin every 30 (thirty) days.    Dispense:  1.68 mL    Refill:  11     I spent 25 minutes of face-to-face and non-face-to-face time with patient.  This included previsit chart review, lab review, study review, order entry, electronic health record documentation, patient education and discussion regarding migraine headaches and treatment plan and answered all questions to patient's satisfaction  Ihor Austin, [facility]  [facility] [address] [place], [place] [address]  Phone [phone] Fax [phone] Note: This document was prepared with digital dictation and possible smart phrase technology. Any transcriptional errors that result from this process are unintentional.

## 2023-09-11 NOTE — Telephone Encounter (Signed)
Pharmacy Patient Advocate Encounter   Received notification from Physician's Office that prior authorization for AJOVY (fremanezumab-vfrm) injection 225MG /1.5ML auto-injectors is required/requested.   Insurance verification completed.   The patient is insured through [facility] .   Per test claim: PA required; PA submitted to above mentioned insurance via CoverMyMeds Key/confirmation #/EOC [id number] Status is pending

## 2023-09-12 NOTE — Telephone Encounter (Signed)
Rebecca Farley from [facility] called  stating that the Ajovy has been Approved from 09/11/23-09/10/24 If there are any questions please call [phone]. This information will be faxed to the office as well.

## 2023-09-15 NOTE — Telephone Encounter (Signed)
[facility] message sent

## 2023-09-18 ENCOUNTER — Encounter: Payer: Self-pay | Admitting: Physician Assistant

## 2023-09-18 ENCOUNTER — Ambulatory Visit: Payer: Medicare Other | Attending: Physician Assistant | Admitting: Physician Assistant

## 2023-09-18 VITALS — BP 156/98 | HR 73 | Ht 65.0 in | Wt 193.0 lb

## 2023-09-18 DIAGNOSIS — I10 Essential (primary) hypertension: Secondary | ICD-10-CM | POA: Diagnosis present

## 2023-09-18 DIAGNOSIS — I350 Nonrheumatic aortic (valve) stenosis: Secondary | ICD-10-CM | POA: Diagnosis present

## 2023-09-18 DIAGNOSIS — E785 Hyperlipidemia, unspecified: Secondary | ICD-10-CM | POA: Diagnosis present

## 2023-09-18 DIAGNOSIS — I4892 Unspecified atrial flutter: Secondary | ICD-10-CM | POA: Diagnosis present

## 2023-09-18 DIAGNOSIS — Z952 Presence of prosthetic heart valve: Secondary | ICD-10-CM | POA: Diagnosis present

## 2023-09-18 MED ORDER — DOXYCYCLINE HYCLATE 50 MG PO CAPS: ORAL_CAPSULE | ORAL | 3 refills | Status: AC

## 2023-09-18 NOTE — Progress Notes (Signed)
Cardiology Office Note:  .   Date:  09/18/2023  ID:  Rebecca Farley, DOB [birth year], MRN [id number] PCP: Delma Officer, PA  [facility] Providers Cardiologist:  Lance Muss, MD {  History of Present Illness: .   Rebecca Farley is a 67 y.o. female with a past medical history of atrial flutter and AS here for follow-up appointment.  Has a history of atrial flutter in the past and had ablation in [place] many years ago.  Her symptoms of atrial flutter were extreme dizziness.  No palpitations.  She had a heart murmur on her routine exam was sent for echo in 2020.  Not having any new symptoms.  Reported some DOE for about 15 years.  Had some dizzy spells for 15 years as well.  It can happen even when she is sitting down.  Wore monitor many years ago when her A-flutter was diagnosed.  Heart rate was 300 bpm per her report.  She had severe AS treated with SAVR in 2021 (aortic valve replacement using a 23 mm Edwards Inspiris Resilia valve).  Postop had some dizzy spells.  In 2022 she fell after tripping and broke her kneecap and a bone on her forehead and was seeing Ortho for that.  Did not need surgery.  December 2023 ran out her blood pressure medicine and her blood pressures were uncontrolled.  Fortunately back on her medicines systolic was between 120s and 409W.  When she was last seen a year ago 09/2022 she denied chest pain, dizziness, leg edema, nitroglycerin use, orthopnea, palpitations, PND, shortness of breath, and syncope.  Tolerating medications without any side effects.  Today, she presents with a past medical history of aortic valve replacement, atrial flutter, and hypertension, presents with ongoing dizziness and shortness of breath. These symptoms have been persistent even before the patient moved to her current location. The dizziness is described as a sudden wave-like sensation, often occurring while the patient is sitting at work. The shortness of breath is a  constant issue, occurring frequently throughout the day. The patient also reports significant sleep issues, which are currently being investigated with a scheduled sleep study. Despite these symptoms, the patient continues to be active and is able to perform daily activities   Reports no chest pain, pressure, or tightness. No edema, orthopnea, PND. Reports no palpitations.   Discussed the use of AI scribe software for clinical note transcription with the patient, who gave verbal consent to proceed.  ROS: Pertinent ROS in HPI  Studies Reviewed: Marland Kitchen   EKG Interpretation Date/Time:  Thursday September 18 2023 15:41:03 EST Ventricular Rate:  71 PR Interval:  202 QRS Duration:  88 QT Interval:  436 QTC Calculation: 473 R Axis:   41  Text Interpretation: Normal sinus rhythm Cannot rule out Anterior infarct (cited on or before 19-Nov-2019) When compared with ECG of 01-Aug-2020 06:46, Sinus rhythm has replaced Junctional rhythm Criteria for Inferior infarct are no longer Present Questionable change in initial forces of Anteroseptal leads ST no longer depressed in Lateral leads T wave inversion less evident in Inferior leads T wave inversion no longer evident in Lateral leads Confirmed by Jari Favre [phone]) on 09/18/2023 3:57:53 PM    Monitor results 03/27/2021 Normal sinus rhythm with rare PVCs and PVCs.  Short runs of PACs and brief episodes of Wenkebach, Mobitz Type 1.  No sustained pathologic arrhythmias., (Of note, HR of 33 bpm was brief, only during the dropped beat during Wenkebach.  No symptoms.)  Physical Exam:   VS:  BP (!) 156/98   Pulse 73   Ht 5\' 5"  (1.651 m)   Wt 193 lb (87.5 kg)   SpO2 96%   BMI 32.12 kg/m    Wt Readings from Last 3 Encounters:  09/18/23 193 lb (87.5 kg)  09/11/23 194 lb (88 kg)  09/17/22 199 lb (90.3 kg)    GEN: Well nourished, well developed in no acute distress NECK: No JVD; No carotid bruits CARDIAC: RRR, no murmurs, rubs, gallops RESPIRATORY:   Clear to auscultation without rales, wheezing or rhonchi  ABDOMEN: Soft, non-tender, non-distended EXTREMITIES:  No edema; No deformity   ASSESSMENT AND PLAN: .    Hypertension Elevated blood pressure in office, possibly due to white coat syndrome. Patient reports lower readings at home, but home blood pressure monitor is outdated. -Recommend purchasing a new Omuron blood pressure cuff. -Initiate blood pressure log for two weeks, checking once a day an hour after medications. -Continue Diovan 160mg  in the morning with food. -Recheck blood pressure readings in two weeks via phone call or MyChart.  Dizziness and Shortness of Breath Chronic symptoms, not fully evaluated. No worsening since aortic valve replacement. No current symptoms of heart failure or arrhythmias. -Consider referral to pulmonology for pulmonary function tests. -Consider tilt table test for possible inner ear disorders. -Continue monitoring symptoms and report any changes.  Sleep Issues Pending sleep study due to difficulty sleeping, possible sleep apnea. -Follow up with results of sleep study. -Consider follow up with Dr. Mayford Knife who specializes in cardiology and sleep disorders.  Aortic Valve Replacement Follow-up No current symptoms related to aortic valve replacement. EKG shows normal sinus rhythm, no PVCs. -Plan for echocardiogram in one year to monitor valve function. -Continue Aspirin 81mg  daily. -Refill Doxycycline for dental procedures.  Dispo: She can follow-up with Dr. Mayford Knife in 6 months  Signed, Sharlene Dory, PA-C

## 2023-09-18 NOTE — Patient Instructions (Signed)
Medication Instructions:   Your physician recommends that you continue on your current medications as directed. Please refer to the Current Medication list given to you today.  *If you need a refill on your cardiac medications before your next appointment, please call your pharmacy*   Lab Work: NONE ORDERED  TODAY    If you have labs (blood work) drawn today and your tests are completely normal, you will receive your results only by: MyChart Message (if you have MyChart) OR A paper copy in the mail If you have any lab test that is abnormal or we need to change your treatment, we will call you to review the results.   Testing/Procedures: NONE ORDERED  TODAY    Follow-Up: At [facility], you and your health needs are our priority.  As part of our continuing mission to provide you with exceptional heart care, we have created designated Provider Care Teams.  These Care Teams include your primary Cardiologist (physician) and Advanced Practice Providers (APPs -  Physician Assistants and Nurse Practitioners) who all work together to provide you with the care you need, when you need it.  We recommend signing up for the patient portal called "MyChart".  Sign up information is provided on this After Visit Summary.  MyChart is used to connect with patients for Virtual Visits (Telemedicine).  Patients are able to view lab/test results, encounter notes, upcoming appointments, etc.  Non-urgent messages can be sent to your provider as well.   To learn more about what you can do with MyChart, go to [email or web address].    Your next appointment:    6 month(s)  Provider:    Dr. Mayford Knife   Other Instructions

## 2023-09-22 NOTE — Telephone Encounter (Signed)
Spoke to pt Pt states per pharmacy $0 cost for Ajovy Pt stated read online ajovy not covered . Informed pt per prior Auth team approval for a year for ajovy Pt expressed understanding and thanked me for calling

## 2023-09-22 NOTE — Telephone Encounter (Signed)
Pt is calling to report she is being told that for the Ajovy she is going to have to pay $800.00, she is asking for a call to discuss.

## 2023-10-08 ENCOUNTER — Other Ambulatory Visit: Payer: Self-pay | Admitting: Interventional Cardiology

## 2023-10-22 ENCOUNTER — Telehealth: Payer: Self-pay | Admitting: Adult Health

## 2023-10-22 ENCOUNTER — Other Ambulatory Visit: Payer: Self-pay | Admitting: Neurology

## 2023-10-22 MED ORDER — AJOVY 225 MG/1.5ML ~~LOC~~ SOAJ: 225.0000 mg | SUBCUTANEOUS | 5 refills | Status: DC

## 2023-10-22 NOTE — Telephone Encounter (Signed)
 It shouldn't need a PA  since there is active one on file we should be ok it looked like there was no active script and so not sure how she would know the true cost if it wasn't getting ran. I have sent updated script to [facility] and will see what they state when the run a claim through.

## 2023-10-22 NOTE — Telephone Encounter (Addendum)
 In reviewing the chart December there was a PA completed for the pt in December and approved through the insurance until Dec 2025. It should be covered. I dont see where there was a active script on file so unsure what the pt cost would be until the pharmacy attempts to process it. Will await and see if the pharmacy can run it and if cost is less.

## 2023-10-22 NOTE — Telephone Encounter (Signed)
 Prior telephone note on 12/5 stating Ajovy  cost would be $0/month, spoke with Jeneane Miracle, CMA. Not sure why she is now saying it will be $400/month. Looks like prescription was discontinued by cardiology as patient reported not taking. This shouldn't need to go through another prior authorization correct?

## 2023-10-22 NOTE — Telephone Encounter (Signed)
 Pt called stating that the Ajovy  will be costing her almost $400 a month. Pt would like for the Ajovy  Rx be discontinued and she will like to go back on to the Emgality . Please advise.

## 2023-10-23 NOTE — Telephone Encounter (Signed)
Called the patient back and advised that the PA on file is approved for the medication. Advised I contacted [facility] to have them run through her insurance and they did but it kicked back stating it is $357.  Pt has a person that works through Community education officer with medicare pts who processed all the meds and was advised that medicare doesn't approve ajovy as formulary med. I advised that is correct that medicare has preferences on which medications that they want to cover but in her case when we completed a prior authorization and they approved it that should mean that it should be covered through her insurance which leads me to think this may be related to a deductible needing to be met. She will call her insurance and see what she can find out and call back if she needs [place]

## 2023-10-23 NOTE — Telephone Encounter (Signed)
Yes that is fine if patient agrees but as you said, if she has a deductible that needs to be met, Emgality would likely be around the same cost.  She may want to contact her insurance company first. Thank you.

## 2023-10-23 NOTE — Telephone Encounter (Signed)
I did call [facility] and was advised that they did process through her insurance and it is $357. This is likely a deductible that she may need to meet before it is covered better.

## 2023-10-24 ENCOUNTER — Other Ambulatory Visit (HOSPITAL_COMMUNITY): Payer: Self-pay

## 2023-10-27 ENCOUNTER — Ambulatory Visit (INDEPENDENT_AMBULATORY_CARE_PROVIDER_SITE_OTHER): Payer: Medicare Other | Admitting: Neurology

## 2023-10-27 VITALS — BP 137/83 | HR 70 | Ht 65.5 in | Wt 193.6 lb

## 2023-10-27 DIAGNOSIS — Z9189 Other specified personal risk factors, not elsewhere classified: Secondary | ICD-10-CM | POA: Diagnosis not present

## 2023-10-27 DIAGNOSIS — R519 Headache, unspecified: Secondary | ICD-10-CM

## 2023-10-27 DIAGNOSIS — R351 Nocturia: Secondary | ICD-10-CM | POA: Diagnosis not present

## 2023-10-27 DIAGNOSIS — R0683 Snoring: Secondary | ICD-10-CM

## 2023-10-27 DIAGNOSIS — G4700 Insomnia, unspecified: Secondary | ICD-10-CM

## 2023-10-27 NOTE — Progress Notes (Signed)
Subjective:    Patient ID: Rebecca Farley is a 68 y.o. female.  HPI    Huston Foley, MD, PhD [facility] [address], [address] [address] [place], [place] [address]  Dear Shanda Bumps and Desma Maxim,    I saw your patient, Rebecca Farley, upon your kind request in my sleep clinic today for initial consultation of her sleep disorder, in particular, concern for underlying obstructive sleep apnea. The patient is unaccompanied today. As you know, the patient is a very pleasant 68 y.o. female with an underlying complex medical history of aortic stenosis with history of aortic valve replacement, atrial flutter with status post radiofrequency ablation, osteoarthritis with status post right total hip replacement, history of syncope, migraine headaches, obesity, Barrett's esophagus, diverticulitis, hypertension and hyperlipidemia, who reports snoring and sleep disruption.  She has chronic difficulty initiating and maintaining sleep.  She has tried [place] and Ambien through PCP in the past but had morning headaches from these medications including when she tried melatonin.  She is currently not on any sleep aid.  Sleep schedule is variable.  Sometimes she falls asleep on the couch and then has trouble with being in bed.  She lives with her daughter.  She has 2 older sons as well.  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  She typically wakes up around 4:30 AM.  She has nocturia once or twice per average night.  She has woken up with headaches, up to 2 or 3 times per week.  Weight has been more or less stable in the past year but she is working on weight loss.  She drinks caffeine in the form of soda, 1 or 2 cans/day.  She drinks alcohol occasionally.  She is a non-smoker.  She is not aware of any family history of sleep apnea. I reviewed your office note from 09/11/23.   Her Past Medical History Is Significant For: Past Medical History:  Diagnosis Date   Atrial  flutter (HCC) 01/2008   RADIOFREQUENCY ABLATION [place]   Barrett esophagus    Bee sting allergy    Diverticulitis    DJD (degenerative joint disease) of knee    KNEES, BACK (LUMBAR FACET DISEASE) AND HIPS, DALLDORF.   GERD (gastroesophageal reflux disease)    Hiatal hernia    History of syncope    RECURRENT   Hyperlipidemia    BORDERLINE   Hypertension    Migraines    Obesity    Osteopenia    S/P RF ablation operation for arrhythmia    Severe aortic stenosis    Shoulder tendinitis, left 2008    Her Past Surgical History Is Significant For: Past Surgical History:  Procedure Laterality Date   ABDOMINAL HYSTERECTOMY     AORTIC VALVE REPLACEMENT N/A 07/27/2020   Procedure: AORTIC VALVE REPLACEMENT (AVR) USING 23 MM INSPIRIS RESILIA  AORTIC VALVE;  Surgeon: Alleen Borne, MD;  Location: [facility] OR;  Service: Open Heart Surgery;  Laterality: N/A;   BREAST BIOPSY Left    10 yrs +   CARDIAC CATHETERIZATION     CESAREAN SECTION     1979, 1984   CHOLECYSTECTOMY     COLONOSCOPY  11/03/2012   Procedure: COLONOSCOPY;  Surgeon: Charolett Bumpers, MD;  Location: [facility] ENDOSCOPY;  Service: Endoscopy;  Laterality: N/A;   ESOPHAGOGASTRODUODENOSCOPY  11/03/2012   Procedure: ESOPHAGOGASTRODUODENOSCOPY (EGD);  Surgeon: Charolett Bumpers, MD;  Location: [facility];  Service: Endoscopy;  Laterality: N/A;  implanted loop device     RIGHT HEART CATH AND CORONARY ANGIOGRAPHY N/A 07/07/2020   Procedure: RIGHT HEART CATH AND CORONARY ANGIOGRAPHY;  Surgeon: Tonny Bollman, MD;  Location: [facility] INVASIVE CV LAB;  Service: Cardiovascular;  Laterality: N/A;   TEE WITHOUT CARDIOVERSION N/A 07/27/2020   Procedure: TRANSESOPHAGEAL ECHOCARDIOGRAM (TEE);  Surgeon: Alleen Borne, MD;  Location: [facility] OR;  Service: Open Heart Surgery;  Laterality: N/A;   TOTAL HIP ARTHROPLASTY Right 11/23/2019   Procedure: RIGHT TOTAL HIP ARTHROPLASTY ANTERIOR APPROACH;  Surgeon: Marcene Corning, MD;  Location: [facility] ORS;  Service:  Orthopedics;  Laterality: Right;    Her Family History Is Significant For: Family History  Problem Relation Age of Onset   Hypertension Mother    Heart failure Mother    Aneurysm Mother    Kidney failure Mother    Lung cancer Mother    Emphysema Father    Prostate cancer Father    Lung cancer Father    Colon cancer Sister 52       dx 3 times; Lynch syndrome (PMS2)   Melanoma Sister 8   Other Sister        genetic testing - PMS2+, BRIP1+, MITF+   Melanoma Sister    Other Sister        genetic testing - PMS2+   Other Sister        genetic testing - BRIP1+, PMS2-, MITF-   Other Sister        genetic testing - BRIP1+, MITF+, PMS2-   Idiopathic pulmonary fibrosis Brother    Colon cancer Brother 22   Lung cancer Maternal Uncle    Liver cancer Maternal Uncle    Lung cancer Paternal Uncle    Ovarian cancer Paternal Grandmother        died early 55s   Migraines Neg Hx    Sleep apnea Neg Hx     Her Social History Is Significant For: Social History   Socioeconomic History   Marital status: Widowed    Spouse name: Not on file   Number of children: 3   Years of education: 12   Highest education level: High school graduate  Occupational History   Not on file  Tobacco Use   Smoking status: Never   Smokeless tobacco: Never  Vaping Use   Vaping status: Never Used  Substance and Sexual Activity   Alcohol use: Yes    Comment: OCCASIONALY   Drug use: No   Sexual activity: Not Currently    Birth control/protection: Surgical  Other Topics Concern   Not on file  Social History Narrative   Not on file   Social Drivers of Health   Financial Resource Strain: Not on file  Food Insecurity: Not on file  Transportation Needs: Not on file  Physical Activity: Not on file  Stress: Not on file  Social Connections: Not on file    Her Allergies Are:  Allergies  Allergen Reactions   Meloxicam Shortness Of Breath and Other (See Comments)    Uncertain of reaction    Penicillins Anaphylaxis, Hives and Other (See Comments)    Has patient had a PCN reaction causing immediate rash, facial/tongue/throat swelling, SOB or lightheadedness with hypotension: No  Has patient had a PCN reaction causing severe rash involving mucus membranes or skin necrosis:  No  Has patient had a PCN reaction that required hospitalization No  Has patient had a PCN reaction occurring within the last 10 years: No  If all of the above  answers are "NO", then may proceed with Cephalosporin use.  Has patient had a PCN reaction causing immediate rash, facial/tongue/throat swelling, SOB or lightheadedness with hypotension: No  Has patient had a PCN reaction causing severe rash involving mucus membranes or skin necrosis:  No  Has patient had a PCN reaction that required hospitalization No  Has patient had a PCN reaction occurring within the last 10 years: No  If all of the above answers are "NO", then may proceed with Cephalosporin use.   Codeine Nausea And Vomiting    STOMACH SICKNESS   Nsaids Itching and Nausea And Vomiting  :   Her Current Medications Are:  Outpatient Encounter Medications as of 10/27/2023  Medication Sig   acetaminophen (TYLENOL) 500 MG tablet Take 500-1,000 mg by mouth every 6 (six) hours as needed (for pain.).   aspirin EC 81 MG tablet Take 1 tablet (81 mg total) by mouth daily. Swallow whole.   Blood Pressure Monitoring (OMRON 3 SERIES BP MONITOR) DEVI UES AS DIRECTED   Calcium Carb-Cholecalciferol (CALCIUM+D3 PO) Take 1 tablet by mouth daily.   cetirizine (ZYRTEC) 10 MG tablet TAKE 1 TABLET BY MOUTH AT BEDTIME   CRESTOR 10 MG tablet Take 10 mg by mouth daily.   EPINEPHrine 0.3 mg/0.3 mL IJ SOAJ injection Inject 0.3 mg into the muscle as needed for anaphylaxis.   folic acid (FOLVITE) 1 MG tablet Take 1 mg by mouth daily.   Galcanezumab-gnlm (EMGALITY [place]) Inject 225 mg into the skin every 30 (thirty) days.   Ginkgo Biloba 120 MG TABS Take 120 mg by mouth daily.    Glucosamine-Chondroitin (COSAMIN DS PO) Take 1-2 tablets by mouth See admin instructions. Take 2 tablets by mouth in the morning & take 1 tablet by mouth at night.   influenza vaccine adjuvanted (FLUAD) 0.5 ML injection Inject into the muscle.   Multiple Vitamin (MULTIVITAMIN WITH MINERALS) TABS tablet Take 1 tablet by mouth daily.   Omega-3 1000 MG CAPS Take 2,000 mg by mouth daily.   omeprazole (PRILOSEC) 20 MG capsule Take 20 mg by mouth daily before breakfast.   Polyethyl Glycol-Propyl Glycol (LUBRICANT EYE DROPS) 0.4-0.3 % SOLN Place 1 drop into both eyes 3 (three) times daily as needed (dry/irritated eyes.).   Turmeric 500 MG TABS Take 1,000 mg by mouth daily.   Ubrogepant (UBRELVY) 100 MG TABS Take 1 tablet (100 mg total) by mouth as needed. Maximum 200mg  a day.   valsartan (DIOVAN) 160 MG tablet TAKE 1 TABLET BY MOUTH ONCE DAILY . APPOINTMENT REQUIRED FOR FUTURE REFILLS   [DISCONTINUED] COVID-19 mRNA vaccine, Pfizer, (COMIRNATY) syringe Inject into the muscle.   doxycycline (VIBRAMYCIN) 50 MG capsule Take 2 capsules 1 hour prior to any dental procedures (Patient not taking: Reported on 10/27/2023)   Fremanezumab-vfrm (AJOVY) 225 MG/1.5ML SOAJ Inject 225 mg into the skin every 30 (thirty) days. (Patient not taking: Reported on 10/27/2023)   No facility-administered encounter medications on file as of 10/27/2023.  :   Review of Systems:  Out of a complete 14 point review of systems, all are reviewed and negative with the exception of these symptoms as listed below:   Review of Systems  Neurological:        Snoring, fragmented sleep. Hard time going to sleep.   No sleep study. Morning headaches.  ESS   6, FSS 41.     Objective:  Neurological Exam  Physical Exam Physical Examination:   Vitals:   10/27/23 1526  BP: 137/83  Pulse: 70  General Examination: The patient is a very pleasant 68 y.o. female in no acute distress. She appears well-developed and well-nourished and well  groomed.   HEENT: Normocephalic, atraumatic, pupils are equal, round and reactive to light, extraocular tracking is good without limitation to gaze excursion or nystagmus noted. Hearing is grossly intact. Face is symmetric with normal facial animation. Speech is clear with no dysarthria noted. There is no hypophonia. There is no lip, neck/head, jaw or voice tremor. Neck is supple with full range of passive and active motion. There are no carotid bruits on auscultation. Oropharynx exam reveals: Mild mouth dryness, adequate dental hygiene with dentures on top, mild airway crowding secondary to small airway entry.  Tonsils are on the smaller side, uvula on the smaller side.  Tongue protrudes centrally and palate elevates symmetrically, neck circumference 14 1/4 inches.   Chest: Clear to auscultation without wheezing, rhonchi or crackles noted.  Heart: S1+S2+0, regular and normal without murmurs, rubs or gallops noted.   Abdomen: Soft, non-tender and non-distended.  Extremities: There is no pitting edema in the distal lower extremities bilaterally.   Skin: Warm and dry without trophic changes noted.   Musculoskeletal: exam reveals no obvious joint deformities.   Neurologically:  Mental status: The patient is awake, alert and oriented in all 4 spheres. Her immediate and remote memory, attention, language skills and fund of knowledge are appropriate. There is no evidence of aphasia, agnosia, apraxia or anomia. Speech is clear with normal prosody and enunciation. Thought process is linear. Mood is normal and affect is normal.  Cranial nerves II - XII are as described above under HEENT exam.  Motor exam: Normal bulk, strength and tone is noted. There is no obvious action or resting tremor.  Fine motor skills and coordination: grossly intact.  Cerebellar testing: No dysmetria or intention tremor. There is no truncal or gait ataxia.  Sensory exam: intact to light touch in the upper and lower extremities.   Gait, station and balance: She stands easily. No veering to one side is noted. No leaning to one side is noted. Posture is age-appropriate and stance is narrow based. Gait shows normal stride length and normal pace. No problems turning are noted.   Assessment and Plan:   In summary, Rebecca Farley is a very pleasant 68 y.o.-year old female with an underlying complex medical history of aortic stenosis with history of aortic valve replacement, atrial flutter with status post radiofrequency ablation, osteoarthritis with status post right total hip replacement, history of syncope, migraine headaches, obesity, Barrett's esophagus, diverticulitis, hypertension and hyperlipidemia, whose history and physical exam are concerning for sleep disordered breathing, particularly obstructive sleep apnea (OSA).  While a laboratory attended sleep study is typically considered "gold standard" for evaluation of sleep disordered breathing, the patient would prefer a home sleep test as she worries that she will not be able to sleep in the sleep lab.   I had a long chat with the patient about my findings and the diagnosis of sleep apnea, particularly OSA, its prognosis and treatment options. We talked about medical/conservative treatments, surgical interventions and non-pharmacological approaches for symptom control. I explained, in particular, the risks and ramifications of untreated moderate to severe OSA, especially with respect to developing cardiovascular disease down the road, including congestive heart failure (CHF), difficult to treat hypertension, cardiac arrhythmias (particularly A-fib), neurovascular complications including TIA, stroke and dementia. Even type 2 diabetes has, in part, been linked to untreated OSA. Symptoms of untreated OSA may include (but may  not be limited to) daytime sleepiness, nocturia (i.e. frequent nighttime urination), memory problems, mood irritability and suboptimally controlled or worsening mood  disorder such as depression and/or anxiety, lack of energy, lack of motivation, physical discomfort, as well as recurrent headaches, especially morning or nocturnal headaches. We talked about the importance of maintaining a healthy lifestyle and striving for healthy weight. In addition, we talked about the importance of striving for and maintaining good sleep hygiene. I recommended a sleep study at this time. I outlined the differences between a laboratory attended sleep study which is considered more comprehensive and accurate over the option of a home sleep test (HST); the latter may lead to underestimation of sleep disordered breathing in some instances and does not help with diagnosing upper airway resistance syndrome and is not accurate enough to diagnose primary central sleep apnea typically. I outlined possible surgical and non-surgical treatment options of OSA, including the use of a positive airway pressure (PAP) device (i.e. CPAP, AutoPAP/APAP or BiPAP in certain circumstances), a custom-made dental device (aka oral appliance, which would require a referral to a specialist dentist or orthodontist typically, and is generally speaking not considered for patients with full dentures or edentulous state), upper airway surgical options, such as traditional UPPP (which is not considered a first-line treatment) or the Inspire device (hypoglossal nerve stimulator, which would involve a referral for consultation with an ENT surgeon, after careful selection, following inclusion criteria - also not first-line treatment). I explained the PAP treatment option to the patient in detail, as this is generally considered first-line treatment.  The patient indicated that she would be willing to try PAP therapy, if the need arises. I explained the importance of being compliant with PAP treatment, not only for insurance purposes but primarily to improve patient's symptoms symptoms, and for the patient's long term health  benefit, including to reduce Her cardiovascular risks longer-term.    We will pick up our discussion about the next steps and treatment options after testing.  We will keep her posted as to the test results by phone call and/or MyChart messaging where possible.  We will plan to follow-up in sleep clinic accordingly as well.  I answered all her questions today and the patient her in agreement.   I encouraged her to call with any interim questions, concerns, problems or updates or email [place] through MyChart.  Generally speaking, sleep test authorizations may take up to 2 weeks, sometimes less, sometimes longer, the patient is encouraged to get in touch with [place] if they do not hear back from the sleep lab staff directly within the next 2 weeks.  Thank you very much for allowing me to participate in the care of this nice patient. If I can be of any further assistance to you please do not hesitate to call me at [phone].  Sincerely,   Huston Foley, MD, PhD

## 2023-10-27 NOTE — Patient Instructions (Signed)

## 2023-11-13 ENCOUNTER — Ambulatory Visit: Payer: Medicare Other

## 2023-11-13 DIAGNOSIS — G4733 Obstructive sleep apnea (adult) (pediatric): Secondary | ICD-10-CM | POA: Diagnosis not present

## 2023-11-13 DIAGNOSIS — Z9189 Other specified personal risk factors, not elsewhere classified: Secondary | ICD-10-CM

## 2023-11-13 DIAGNOSIS — R0683 Snoring: Secondary | ICD-10-CM

## 2023-11-13 DIAGNOSIS — R519 Headache, unspecified: Secondary | ICD-10-CM

## 2023-11-13 DIAGNOSIS — G4700 Insomnia, unspecified: Secondary | ICD-10-CM

## 2023-11-13 DIAGNOSIS — R351 Nocturia: Secondary | ICD-10-CM

## 2023-11-18 NOTE — Progress Notes (Signed)
 See procedure note.

## 2023-11-20 ENCOUNTER — Encounter: Payer: Self-pay | Admitting: Neurology

## 2023-11-20 NOTE — Procedures (Signed)
 [facility]  HOME SLEEP TEST (SANSA) REPORT (Mail-Out Device):   STUDY DATE: 11/12/2023  DOB: 13-Apr-1956  MRN: [id number]  ORDERING CLINICIAN: True Mar, MD, PhD   REFERRING CLINICIAN: Harlene Bogaert, NP  CLINICAL INFORMATION/HISTORY: 68 y.o. female with an underlying complex medical history of aortic stenosis with history of aortic valve replacement, atrial flutter with status post radiofrequency ablation, osteoarthritis with status post right total hip replacement, history of syncope, migraine headaches, obesity, Barrett's esophagus, diverticulitis, hypertension and hyperlipidemia, who reports snoring and sleep disruption.   PATIENT'S LAST REPORTED EPWORTH SLEEPINESS SCORE (ESS): 6/24.  BMI (at the time of sleep clinic visit and/or test date): 31.7 kg/m  FINDINGS:   Study Protocol:    The SANSA single-point-of-skin-contact chest-worn sensor - an FDA cleared and DOT approved type 4 home sleep test device - measures eight physiological channels,  including blood oxygen saturation (measured via PPG [photoplethysmography]), EKG-derived heart rate, respiratory effort, chest movement (measured via accelerometer), snoring, body position, and actigraphy. The device is designed to be worn for up to 10 hours per study.   Sleep Summary:   Total Recording Time (hours, min): 8 hours, 21 min  Total Effective Sleep Time (hours, min):  6 hours, 19 min  Sleep Efficiency (%):    76%   Respiratory Indices:   Calculated sAHI (per hour):  0.8/hour (utilizing the 4% desaturation criteria for obstructive hypopneas per Medicare guidelines)         Oxygen Saturation Statistics:    Oxygen Saturation (%) Mean: 92.7%   Minimum oxygen saturation (%):                 81.4%   O2 Saturation Range (%): 81.4-98.9%   Time below or at 88% saturation: 7 min -could be an overestimation as the O2 sensor may have dislodged during the study.   Pulse Rate Statistics:   Pulse Mean  (bpm):    64/min    Pulse Range (55 - 104/min)   Snoring: Intermittent, mild  IMPRESSION/DIAGNOSES:   Primary snoring   RECOMMENDATIONS:   This home sleep test does not demonstrate any significant obstructive or central sleep disordered breathing with a total AHI of less than 5/hour.  Her total AHI was 0.8/h, O2 nadir 81.4% which could be an overestimation as the O2 sensor appeared to have occasional errors during the study.  Snoring was intermittent and mostly in the mild range. Treatment with a positive airway pressure device such as AutoPap or CPAP is not indicated based on this test. Snoring may improve with avoidance of the supine sleep position and weight loss (where clinically appropriate).   For disturbing snoring, an oral appliance through dentistry or orthodontics can be considered.  Other causes of the patient's symptoms, including circadian rhythm disturbances, an underlying mood disorder, medication effect and/or an underlying medical problem cannot be ruled out based on this test. Clinical correlation is recommended.  The patient should be cautioned not to drive, work at heights, or operate dangerous or heavy equipment when tired or sleepy. Review and reiteration of good sleep hygiene measures should be pursued with any patient. The patient will be advised to follow up with her referring provider, who will be notified of the test results.   I certify that I have reviewed the raw data recording prior to the issuance of this report in accordance with the standards of the American Academy of Sleep Medicine (AASM).    INTERPRETING PHYSICIAN:   True Mar, MD, PhD Medical Director, [facility]  [facility] at [facility]) Diplomat, ABPN (Neurology and Sleep)   [facility] [address] [place], [place] [address] [phone]

## 2023-11-21 ENCOUNTER — Telehealth: Payer: Self-pay

## 2023-11-21 NOTE — Telephone Encounter (Signed)
*  [facility]  Pharmacy Patient Advocate Encounter   Received notification from Fax that prior authorization for Ubrelvy 100MG  tablets  is required/requested.   Insurance verification completed.   The patient is insured through [facility] .   Per test claim: PA required; PA submitted to above mentioned insurance via CoverMyMeds Key/confirmation #/EOC [id number] Status is pending

## 2023-11-24 ENCOUNTER — Other Ambulatory Visit (HOSPITAL_COMMUNITY): Payer: Self-pay

## 2023-11-24 NOTE — Telephone Encounter (Signed)
Pharmacy Patient Advocate Encounter  Received notification from [facility] that Prior Authorization for Bernita Raisin has been APPROVED from 11/21/2023 to 11/20/2024. Ran test claim, Copay is $45.00. This test claim was processed through [facility]- copay amounts may vary at other pharmacies due to pharmacy/plan contracts, or as the patient moves through the different stages of their insurance plan.

## 2024-02-23 ENCOUNTER — Ambulatory Visit
Admission: RE | Admit: 2024-02-23 | Discharge: 2024-02-23 | Disposition: A | Discharge status: Home / Self Care | Payer: Medicare Other | Source: Ambulatory Visit | Attending: Physician Assistant | Admitting: Physician Assistant

## 2024-02-23 DIAGNOSIS — M8580 Other specified disorders of bone density and structure, unspecified site: Secondary | ICD-10-CM

## 2024-03-29 ENCOUNTER — Telehealth: Payer: Self-pay | Admitting: Neurology

## 2024-03-29 NOTE — Telephone Encounter (Signed)
 Request to r/s appointment

## 2024-03-30 ENCOUNTER — Ambulatory Visit: Payer: Medicare Other | Admitting: Adult Health

## 2024-04-30 ENCOUNTER — Other Ambulatory Visit: Payer: Self-pay | Admitting: Internal Medicine

## 2024-04-30 DIAGNOSIS — Z1231 Encounter for screening mammogram for malignant neoplasm of breast: Secondary | ICD-10-CM

## 2024-05-31 ENCOUNTER — Ambulatory Visit
Admission: RE | Admit: 2024-05-31 | Discharge: 2024-05-31 | Disposition: A | Discharge status: Home / Self Care | Source: Ambulatory Visit | Attending: Internal Medicine | Admitting: Internal Medicine

## 2024-05-31 DIAGNOSIS — Z1231 Encounter for screening mammogram for malignant neoplasm of breast: Secondary | ICD-10-CM

## 2024-07-23 ENCOUNTER — Ambulatory Visit: Admitting: Adult Health

## 2024-09-14 ENCOUNTER — Other Ambulatory Visit: Payer: Self-pay | Admitting: Adult Health

## 2024-10-12 ENCOUNTER — Encounter: Payer: Self-pay | Admitting: Student in an Organized Health Care Education/Training Program

## 2024-10-12 ENCOUNTER — Ambulatory Visit
Attending: Student in an Organized Health Care Education/Training Program | Admitting: Student in an Organized Health Care Education/Training Program

## 2024-10-12 VITALS — BP 160/84 | HR 70 | Ht 65.0 in | Wt 197.2 lb

## 2024-10-12 DIAGNOSIS — I4892 Unspecified atrial flutter: Secondary | ICD-10-CM | POA: Diagnosis not present

## 2024-10-12 DIAGNOSIS — Z952 Presence of prosthetic heart valve: Secondary | ICD-10-CM | POA: Insufficient documentation

## 2024-10-12 DIAGNOSIS — I350 Nonrheumatic aortic (valve) stenosis: Secondary | ICD-10-CM | POA: Diagnosis not present

## 2024-10-12 DIAGNOSIS — R072 Precordial pain: Secondary | ICD-10-CM | POA: Diagnosis not present

## 2024-10-12 DIAGNOSIS — I10 Essential (primary) hypertension: Secondary | ICD-10-CM | POA: Diagnosis not present

## 2024-10-12 DIAGNOSIS — E785 Hyperlipidemia, unspecified: Secondary | ICD-10-CM | POA: Diagnosis not present

## 2024-10-12 NOTE — Patient Instructions (Signed)
" °  Lab Work: Lipid panel LP(a) BMP  If you have labs (blood work) drawn today and your tests are completely normal, you will receive your results only by: MyChart Message (if you have MyChart) OR A paper copy in the mail If you have any lab test that is abnormal or we need to change your treatment, we will call you to review the results.  Testing/Procedures: ECHOCARDIOGRAM  Your physician has requested that you have an echocardiogram. Echocardiography is a painless test that uses sound waves to create images of your heart. It provides your doctor with information about the size and shape of your heart and how well your hearts chambers and valves are working. This procedure takes approximately one hour. There are no restrictions for this procedure. Please do NOT wear cologne, perfume, aftershave, or lotions (deodorant is allowed). Please arrive 15 minutes prior to your appointment time.  Please note: We ask at that you not bring children with you during ultrasound (echo/ vascular) testing. Due to room size and safety concerns, children are not allowed in the ultrasound rooms during exams. Our front office staff cannot provide observation of children in our lobby area while testing is being conducted. An adult accompanying a patient to their appointment will only be allowed in the ultrasound room at the discretion of the ultrasound technician under special circumstances. We apologize for any inconvenience.  EXERCISE NUCLEAR STRESS TEST   Your physician has requested that you have en exercise stress myoview. For further information please visit [email or web address] Please follow instruction sheet, as given.   Follow-Up: At [facility], you and your health needs are our priority.  As part of our continuing mission to provide you with exceptional heart care, our providers are all part of one team.  This team includes your primary Cardiologist (physician) and Advanced Practice Providers  or APPs (Physician Assistants and Nurse Practitioners) who all work together to provide you with the care you need, when you need it.  Your next appointment:   6 month(s)  Provider:   Georganna Archer, MD          "

## 2024-10-12 NOTE — Progress Notes (Signed)
 " Cardiology Office Note:   Date:  10/12/2024  ID:  Rebecca Farley, DOB [birth year], MRN [id number] PCP: Alys Schuyler HERO, PA  [facility] Providers Cardiologist:  Georganna Archer, MD { Chief Complaint:  Chief Complaint  Patient presents with   Chest Pain      History of Present Illness:   Rebecca Farley is a 69 y.o. 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  The patient states that over the past 3 months she has had intermittent episodes of severe right-sided chest pain that makes her concerned.  She says that she has had about 3 episodes of the past 3 months where randomly she will have sharp right-sided chest pain that last for roughly 1 minute.  The pain does not radiate and is not associated with any SOB, diaphoresis, nausea, vomiting, syncope or presyncope.  She does report chronic DOE however.  She walks her dog regularly and does not have exertional chest pain.  She takes all her medications as prescribed without side effect.  She describes the chest pain as being very severe which prompted her to share with me.  No other concerns today.   Past Medical History:  Diagnosis Date   Atrial flutter (HCC) 01/2008   RADIOFREQUENCY ABLATION [place]    Barrett esophagus    Bee sting allergy    Diverticulitis    DJD (degenerative joint disease) of knee    KNEES, BACK (LUMBAR FACET DISEASE) AND HIPS, DALLDORF.   GERD (gastroesophageal reflux disease)    Hiatal hernia    History of syncope    RECURRENT   Hyperlipidemia    BORDERLINE   Hypertension    Migraines    Obesity    Osteopenia    S/P RF ablation operation for arrhythmia    Severe aortic stenosis    Shoulder tendinitis, left 2008     Studies Reviewed:    EKG:  EKG Interpretation Date/Time:  Tuesday October 12 2024 15:38:38 EST Ventricular Rate:  70 PR Interval:  204 QRS  Duration:  90 QT Interval:  444 QTC Calculation: 479 R Axis:   73  Text Interpretation: Normal sinus rhythm Possible Inferior Infarct When compared with ECG of 18-Sep-2023 15:41, No significant change was found Confirmed by Archer Georganna [phone]) on 10/12/2024 3:45:04 PM     Cardiac Studies & Procedures   ______________________________________________________________________________________________ CARDIAC CATHETERIZATION  CARDIAC CATHETERIZATION 07/07/2020  Conclusion 1. Known severe aortic stenosis by noninvasive assessment. Heavily calcified, restricted aortic valve by plain fluoroscopy 2. Patent coronary arteries with very mild nonobstructive irregularity in the RCA and LAD, no significant stenosis 3. Normal right heart pressures  Plan: continue multidisciplinary heart team evaluation for treatment of severe, stage D1 aortic stenosis  Findings Coronary Findings Diagnostic  Dominance: Right  Left Anterior Descending The vessel exhibits minimal luminal irregularities.  Right Coronary Artery The vessel exhibits minimal luminal irregularities.  Intervention  No interventions have been documented.     ECHOCARDIOGRAM  ECHOCARDIOGRAM COMPLETE 10/17/2020  Narrative ECHOCARDIOGRAM REPORT    Patient Name:   Rebecca Farley Date of Exam: 10/17/2020 Medical Rec #:  [id number]     Height:       65.0 in Accession #:    [id number]    Weight:       189.0 lb Date of Birth:  11-15-1955  BSA:          1.931 m Patient Age:    64 years      BP:           102/70 mmHg Patient Gender: F             HR:           80 bpm. Exam Location:  [address]  Procedure: 2D Echo, Cardiac Doppler and Color Doppler  Indications:    I35.0 Aortic Stenosis  History:        Patient has prior history of Echocardiogram examinations, most recent 07/27/2020. Aortic Valve Replacement-23 mm Inspiris Resilia; Risk Factors:Hypertension and Dyslipidemia. Atrial Flutter.  Sonographer:    NaTashia  Rodgers-Jones RDCS Referring Phys: [id number] JAYADEEP S VARANASI  IMPRESSIONS   1. 23 mm Edwards inspiris bioprosthetic valve in aortic position. V max 1.9 m/s, MG 10 mmHG, EOA 2.54 cm2, DI 0.73. No regurgitation. Normal prosthesis. The aortic valve has been repaired/replaced. Aortic valve regurgitation is not visualized. Procedure Date: 07/27/2020. Echo findings are consistent with normal structure and function of the aortic valve prosthesis. 2. Left ventricular ejection fraction, by estimation, is 65 to 70%. The left ventricle has normal function. The left ventricle has no regional wall motion abnormalities. There is mild asymmetric left ventricular hypertrophy of the basal-septal segment. Left ventricular diastolic parameters were normal. 3. Right ventricular systolic function is normal. The right ventricular size is normal. There is normal pulmonary artery systolic pressure. The estimated right ventricular systolic pressure is 20.8 mmHg. 4. The mitral valve is degenerative. Mild mitral valve regurgitation. No evidence of mitral stenosis. 5. The inferior vena cava is normal in size with greater than 50% respiratory variability, suggesting right atrial pressure of 3 mmHg.  Comparison(s): Changes from prior study are noted. AoV has been replaced and is normal.  FINDINGS Left Ventricle: Left ventricular ejection fraction, by estimation, is 65 to 70%. The left ventricle has normal function. The left ventricle has no regional wall motion abnormalities. The left ventricular internal cavity size was normal in size. There is mild asymmetric left ventricular hypertrophy of the basal-septal segment. Abnormal (paradoxical) septal motion consistent with post-operative status. Left ventricular diastolic parameters were normal.  Right Ventricle: The right ventricular size is normal. No increase in right ventricular wall thickness. Right ventricular systolic function is normal. There is normal pulmonary artery  systolic pressure. The tricuspid regurgitant velocity is 2.11 m/s, and with an assumed right atrial pressure of 3 mmHg, the estimated right ventricular systolic pressure is 20.8 mmHg.  Left Atrium: Left atrial size was normal in size.  Right Atrium: Right atrial size was normal in size.  Pericardium: Trivial pericardial effusion is present. Presence of pericardial fat pad.  Mitral Valve: The mitral valve is degenerative in appearance. There is mild calcification of the anterior mitral valve leaflet(s). Mild mitral annular calcification. Mild mitral valve regurgitation. No evidence of mitral valve stenosis.  Tricuspid Valve: The tricuspid valve is grossly normal. Tricuspid valve regurgitation is trivial. No evidence of tricuspid stenosis.  Aortic Valve: 23 mm Edwards inspiris bioprosthetic valve in aortic position. V max 1.9 m/s, MG 10 mmHG, EOA 2.54 cm2, DI 0.73. No regurgitation. Normal prosthesis. The aortic valve has been repaired/replaced. Aortic valve regurgitation is not visualized. Aortic valve mean gradient measures 10.0 mmHg. Aortic valve peak gradient measures 14.6 mmHg. Aortic valve area, by VTI measures 2.54 cm. There is a 23 mm Edwards bioprosthetic valve present in the aortic position. Echo findings are consistent  with normal structure and function of the aortic valve prosthesis.  Pulmonic Valve: The pulmonic valve was grossly normal. Pulmonic valve regurgitation is not visualized. No evidence of pulmonic stenosis.  Aorta: The aortic root and ascending aorta are structurally normal, with no evidence of dilitation.  Venous: The right upper pulmonary vein is normal. The inferior vena cava is normal in size with greater than 50% respiratory variability, suggesting right atrial pressure of 3 mmHg.  IAS/Shunts: The atrial septum is grossly normal.   LEFT VENTRICLE PLAX 2D LVIDd:         3.40 cm  Diastology LVIDs:         2.20 cm  LV e' medial:    7.94 cm/s LV PW:         1.00  cm  LV E/e' medial:  10.4 LV IVS:        1.30 cm  LV e' lateral:   7.83 cm/s LVOT diam:     2.10 cm  LV E/e' lateral: 10.5 LV SV:         88 LV SV Index:   45 LVOT Area:     3.46 cm   RIGHT VENTRICLE RV Basal diam:  3.60 cm RV S prime:     10.60 cm/s TAPSE (M-mode): 1.4 cm  LEFT ATRIUM             Index       RIGHT ATRIUM           Index LA diam:        3.70 cm 1.92 cm/m  RA Area:     10.10 cm LA Vol (A2C):   36.5 ml 18.90 ml/m RA Volume:   23.80 ml  12.32 ml/m LA Vol (A4C):   23.4 ml 12.12 ml/m LA Biplane Vol: 30.7 ml 15.90 ml/m AORTIC VALVE AV Area (Vmax):    2.38 cm AV Area (Vmean):   2.25 cm AV Area (VTI):     2.54 cm AV Vmax:           191.00 cm/s AV Vmean:          146.667 cm/s AV VTI:            0.345 m AV Peak Grad:      14.6 mmHg AV Mean Grad:      10.0 mmHg LVOT Vmax:         131.00 cm/s LVOT Vmean:        95.100 cm/s LVOT VTI:          0.253 m LVOT/AV VTI ratio: 0.73  AORTA Ao Root diam: 3.30 cm Ao Asc diam:  3.60 cm  MITRAL VALVE                TRICUSPID VALVE MV Area (PHT): 2.13 cm     TR Peak grad:   17.8 mmHg MV Decel Time: 356 msec     TR Vmax:        211.00 cm/s MV E velocity: 82.50 cm/s MV A velocity: 128.00 cm/s  SHUNTS MV E/A ratio:  0.64         Systemic VTI:  0.25 m Systemic Diam: 2.10 cm  Rebecca Decent MD Electronically signed by Rebecca Decent MD Signature Date/Time: 10/17/2020/10:42:56 AM    Final   TEE  ECHO INTRAOPERATIVE TEE 07/27/2020  Narrative *INTRAOPERATIVE TRANSESOPHAGEAL REPORT *    Patient Name:   Rebecca Farley Date of Exam: 07/27/2020 Medical Rec #:  [id number]     Height:  65.0 in Accession #:    [id number]    Weight:       190.4 lb Date of Birth:  25-May-1956      BSA:          1.94 m Patient Age:    64 years      BP:           159/91 mmHg Patient Gender: F             HR:           67 bpm. Exam Location:  Inpatient  Transesophogeal exam was perform intraoperatively during surgical  procedure. Patient was closely monitored under general anesthesia during the entirety of examination.  Indications:     I35.0 Nonrheumatic aortic (valve) stenosis Performing Phys: [id number] DORISE POUR BARTLE Diagnosing Phys: Rebecca Laity MD  Complications: No known complications during this procedure. PRE-OP FINDINGS Left Ventricle: The left ventricle has hyperdynamic systolic function. The cavity size was normal. The LV systolic function was vigorous. The LV ejection fraction was estimated at 65-70% using the Simpson's method of disks in the 4 chamber and two chamber views. There was mild concentric left ventricular hypertrophy. The LV wall thickness was 1.1 cm. There was an area of increased LV wall thickness measuring 1.54 cm in the basal septum just proximal to the LV outflow tract. The was no evidence of LV outflow tract obstruction on the prebypass or post-bypass views.  Right Ventricle: The right ventricle has normal systolic function. The cavity was normal. There is no increase in right ventricular wall thickness.  Left Atrium: Left atrial size was normal in size. The left atrial appendage is well visualized and there is evidence of thrombus present.  Right Atrium: Right atrial size was normal in size.  Interatrial Septum: No atrial level shunt detected by color flow Doppler.  Pericardium: There is no evidence of pericardial effusion.  Mitral Valve: The mitral valve leaflets were thickend with calcification noted at the base of the anterior leaflet and focal calcification on the more distal leaflet surface. The lealets opened normally and there was trace mitral insufficiency.  Tricuspid Valve: The tricuspid valve was normal in structure. Tricuspid valve regurgitation is trivial by color flow Doppler.  Aortic Valve: There was severe aortic stenosis. The aortic valve leafets were heavily calcified with severe restriction to opening. The Aortic Valve Vmax was 4.28 m/sec. The mean gradient  was 45 mm hg, the AVA by VTI was 0.84 cm2 and 0.94 cm2 by Vmax. The aortic valve velocity ratio was 0.26.  There was mild aortic insufficiency.  On the post-bypass exam, there was a bioprosthetic valve in the aortic position. The leaflets opened normally and there was no aortic insufficiency seen. The mean trans-aortic velocity was 9 mm hg.  Pulmonic Valve: The pulmonic valve was normal in structure, with normal. Pulmonic valve regurgitation is trivial by color flow Doppler.   Aorta: There was a well-defined aortic root and sino-tubular junction without dilatation or effacement. There was calcification noted in the right sinus of valsalva and at the base of the non-coronary sinus of valsalva. The aortic root dimensions were normal. The aortic annulus measured 21 mm, the aortic root at the sinuses of valsalva was 2.35 cm and the sinotubular junction was 2.1 cm. The proximal ascending aorta was 3.4 cm at its largest point. There was intimal thickening present but no protruding atherosclerotic plaques noted.  The descending aorta was normal in diameter with scatterd grade 3-4 atherosclerotic plaques present.  +--------------+--------++  LEFT VENTRICLE         +--------------+--------++ PLAX 2D                +--------------+--------++ LVOT diam:    1.95 cm  +--------------+--------++ LVOT Area:    2.99 cm +--------------+--------++                        +--------------+--------++  +------------------+---------++ LV Volumes (MOD)            +------------------+---------++ LV area d, A2C:   17.70 cm +------------------+---------++ LV area d, A4C:   27.50 cm +------------------+---------++ LV area s, A2C:   8.23 cm  +------------------+---------++ LV area s, A4C:   10.40 cm +------------------+---------++ LV major d, A2C:  6.16 cm   +------------------+---------++ LV major d, A4C:  7.85 cm   +------------------+---------++ LV  major s, A2C:  5.05 cm   +------------------+---------++ LV major s, A4C:  5.33 cm   +------------------+---------++ LV vol d, MOD A2C:42.1 ml   +------------------+---------++ LV vol d, MOD A4C:77.5 ml   +------------------+---------++ LV vol s, MOD A2C:10.7 ml   +------------------+---------++ LV vol s, MOD A4C:16.8 ml   +------------------+---------++ LV SV MOD A2C:    31.4 ml   +------------------+---------++ LV SV MOD A4C:    77.5 ml   +------------------+---------++ LV SV MOD BP:     50.0 ml   +------------------+---------++  +------------------+------------++ AORTIC VALVE                   +------------------+------------++ AV Area (Vmax):   1.02 cm     +------------------+------------++ AV Area (Vmean):  1.09 cm     +------------------+------------++ AV Area (VTI):    1.12 cm     +------------------+------------++ AV Vmax:          374.00 cm/s  +------------------+------------++ AV Vmean:         248.000 cm/s +------------------+------------++ AV VTI:           0.856 m      +------------------+------------++ AV Peak Grad:     56.0 mmHg    +------------------+------------++ AV Mean Grad:     31.8 mmHg    +------------------+------------++ LVOT Vmax:        128.00 cm/s  +------------------+------------++ LVOT Vmean:       90.200 cm/s  +------------------+------------++ LVOT VTI:         0.320 m      +------------------+------------++ LVOT/AV VTI ratio:0.37         +------------------+------------++  +------------+-------++ AORTA               +------------+-------++ Ao Asc diam:1.90 cm +------------+-------++   +--------------+-------+ SHUNTS                +--------------+-------+ Systemic VTI: 0.32 m  +--------------+-------+ Systemic Diam:1.95 cm +--------------+-------+   Rebecca Laity MD Electronically signed by Rebecca Laity MD Signature  Date/Time: 07/27/2020/5:34:03 PM    Final  MONITORS  LONG TERM MONITOR (3-14 DAYS) 04/26/2021  Narrative  Normal sinus rhythm with rare PVCs and PVCs.  Short runs of PACs and brief episodes of Wenkebach, Mobitz Type 1.  No sustained pathologic arrhythmias.,   Patch Wear Time:  13 days and 20 hours (2022-07-02T11:34:27-0400 to 2022-07-16T08:09:16-0400)  Patient had a min HR of 33 bpm, max HR of 143 bpm, and avg HR of 80 bpm. Predominant underlying rhythm was Sinus Rhythm. Slight P wave morphology changes were noted. 4 Supraventricular Tachycardia runs occurred, the run with the fastest interval lasting 12 beats with a  max rate of 139 bpm (avg 129 bpm); the run with the fastest interval was also the longest. Second Degree AV Block-Mobitz I (Wenckebach) was present. Isolated SVEs were rare (<1.0%), SVE Couplets were rare (<1.0%), and SVE Triplets were rare (<1.0%). Isolated VEs were rare (<1.0%), and no VE Couplets or VE Triplets were present.   CT SCANS  CT CORONARY MORPH W/CTA COR W/SCORE 07/12/2020  Addendum 07/12/2020 11:28 AM ADDENDUM REPORT: 07/12/2020 11:26  CLINICAL DATA:  Aortic stenosis  EXAM: Cardiac TAVR CT  TECHNIQUE: The patient was scanned on a Siemens Force 192 slice scanner. A 120 kV retrospective scan was triggered in the descending thoracic aorta at 111 HU's. Gantry rotation speed was 270 msecs and collimation was .9 mm. No beta blockade or nitro were given. The 3D data set was reconstructed in 5% intervals of the R-R cycle. Systolic and diastolic phases were analyzed on a dedicated work station using MPR, MIP and VRT modes. The patient received 80 cc of contrast.  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  Aorta: Moderate calcific atherosclerosis with normal arch vessels and no aneurysm  Sinotubular Junction: 24 mm  Ascending Thoracic Aorta: 34 mm  Aortic Arch: 29 mm  Descending Thoracic Aorta:  Sinus of  Valsalva Measurements:  Non-coronary: 30.5 mm  Right - coronary: 26 mm  Left - coronary: 28 mm  Coronary Artery Height above Annulus:  Left Main: 11.5 mm above annulus  Right Coronary: 11.5 mm above annulus  Virtual Basal Annulus Measurements:  Maximum/Minimum Diameter: 25 mm x 20.8 mm  Perimeter: 72 mm  Area: 425 mm2  Coronary Arteries: Sufficient height above annulus for deployment  Optimum Fluoroscopic Angle for Delivery: LAO 14 Caudal 14 degrees  IMPRESSION: 1. Functionally bicuspid AV with annular area of 425 mm2 suitable for a 23 mm Sapien 3 valve  2.   Normal aortic root 3.4 cm  3.  Coronaries sufficient height above annulus for deployment  4. Optimum angiographic angle for deployment LAO 14 Caudal 14 degrees  Rebecca Farley   Electronically Signed By: Rebecca Farley M.D. On: 07/12/2020 11:26  Narrative EXAM: OVER-READ INTERPRETATION  CT CHEST  The following report is an over-read performed by radiologist Dr. Toribio Aye of [facility] on 07/12/2020. This over-read does not include interpretation of cardiac or coronary anatomy or pathology. The coronary calcium score/coronary CTA interpretation by the cardiologist is attached.  COMPARISON:  None.  FINDINGS: Extracardiac findings will be described separately under dictation for contemporaneously obtained CTA chest, abdomen and pelvis.  IMPRESSION: Please see separate dictation for contemporaneously obtained CTA chest, abdomen and pelvis dated 07/12/2020 for full description of relevant extracardiac findings.  Electronically Signed: By: Toribio Aye M.D. On: 07/12/2020 10:14     ______________________________________________________________________________________________      Risk Assessment/Calculations:    CHA2DS2-VASc Score = 3   This indicates a 3.2% annual risk of stroke. The patient's score is based upon: CHF History: 0 HTN History: 1 Diabetes History:  0 Stroke History: 0 Vascular Disease History: 0 Age Score: 1 Gender Score: 1           Physical Exam:     VS:  BP (!) 160/84 (BP Location: Right Arm, Patient Position: Sitting, Cuff Size: Normal)   Pulse 70   Ht 5' 5 (1.651 m)   Wt 197 lb 3.2 oz (89.4 kg)   SpO2 97%   BMI 32.82 kg/m      Wt Readings from Last 3 Encounters:  10/12/24 197 lb 3.2 oz (89.4 kg)  10/27/23 193 lb 9.6 oz (87.8 kg)  09/18/23 193 lb (87.5 kg)     GEN: Well nourished, well developed, in no acute distress NECK: No JVD; No carotid bruits CARDIAC: RRR, II/VI systolic murmur at RUSB, no rubs or gallops RESPIRATORY:  Clear to auscultation without rales, wheezing or rhonchi  ABDOMEN: Soft, non-tender, non-distended, normal bowel sounds EXTREMITIES:  Warm and well perfused, no edema; No deformity, 2+ radial pulses PSYCH: Normal mood and affect   Assessment & Plan  #Chest Pain - Several episodes of chest pain over the past few months that has the patient very concerned. -She certainly has risk factors for obstructive CAD even though her chest pain symptoms are atypical. -She underwent a LHC in 2021 that showed very mild nonobstructive CAD. -We will pursue an exercise nuclear stress test for evaluation. Exercise SPECT Complete echocardiogram Follow-up in 6 months   #Severe Aortic Stenosis s/p bSAVR (2021) - Severe AS and underwent a bioprosthetic SAVR in 2021. - She reports chronic DOE that was present before her SAVR and persisted after. - Given that she is having chest pain and DOE I will check a complete echocardiogram for evaluation. Complete echocardiogram  #Atrial Flutter s/p Ablation - Remote history of atrial flutter and underwent an ablation. - Reportedly no recurrence of atrial flutter. - Her CHA2DS2-VASc = 3 which would suggest the need for long-term DOAC use; however, since she presumably was treated after an ablation and has been doing well I would not make any changes at this  time.  #HTN - BP is elevated today in clinic, but the patient states that her blood pressure is always high in clinic looks really good at home. - Her blood pressures are consistently <130/80 at home and she checks them almost every single day. - Will do a 2-week blood pressure log to be certain. 2-week blood pressure log Continue valsartan  160 mg daily BMP  #HLD - Due for lipid check Check lipid panel Continue Crestor     Informed Consent   Shared Decision Making/Informed Consent The risks [chest pain, shortness of breath, cardiac arrhythmias, dizziness, blood pressure fluctuations, myocardial infarction, stroke/transient ischemic attack, nausea, vomiting, allergic reaction, radiation exposure, metallic taste sensation and life-threatening complications (estimated to be 1 in 10,000)], benefits (risk stratification, diagnosing coronary artery disease, treatment guidance) and alternatives of a nuclear stress test were discussed in detail with Rebecca Farley and she agrees to proceed.      This note was written with the assistance of a dictation microphone or AI dictation software. Please excuse any typos or grammatical errors.   Signed, Georganna Archer, MD 10/12/2024 3:44 PM    [facility]  "

## 2024-10-13 LAB — BASIC METABOLIC PANEL WITH GFR
BUN/Creatinine Ratio: 27 (ref 12–28)
BUN: 20 mg/dL (ref 8–27)
CO2: 25 mmol/L (ref 20–29)
Calcium: 9.8 mg/dL (ref 8.7–10.3)
Chloride: 101 mmol/L (ref 96–106)
Creatinine, Ser: 0.73 mg/dL (ref 0.57–1.00)
Glucose: 89 mg/dL (ref 70–99)
Potassium: 4.6 mmol/L (ref 3.5–5.2)
Sodium: 140 mmol/L (ref 134–144)
eGFR: 88 mL/min/1.73

## 2024-10-13 LAB — LIPOPROTEIN A (LPA): Lipoprotein (a): 29.1 nmol/L

## 2024-10-13 LAB — LIPID PANEL
Chol/HDL Ratio: 2 ratio (ref 0.0–4.4)
Cholesterol, Total: 171 mg/dL (ref 100–199)
HDL: 85 mg/dL
LDL Chol Calc (NIH): 73 mg/dL (ref 0–99)
Triglycerides: 70 mg/dL (ref 0–149)
VLDL Cholesterol Cal: 13 mg/dL (ref 5–40)

## 2024-10-14 ENCOUNTER — Ambulatory Visit: Payer: Self-pay | Admitting: Student in an Organized Health Care Education/Training Program

## 2024-10-15 ENCOUNTER — Other Ambulatory Visit (HOSPITAL_COMMUNITY): Payer: Self-pay

## 2024-10-15 ENCOUNTER — Telehealth: Payer: Self-pay | Admitting: Neurology

## 2024-10-15 ENCOUNTER — Telehealth: Payer: Self-pay

## 2024-10-15 NOTE — Telephone Encounter (Signed)
 Pharmacy Patient Advocate Encounter  Received notification from [employer] that Prior Authorization for Ajovy  has been APPROVED from 10/15/2024 to 10/06/2025   PA #/Case ID/Reference #: [id number]

## 2024-10-15 NOTE — Telephone Encounter (Signed)
 Pt now has [employer]([phone]) she has been told that a PA is needed for Fremanezumab -vfrm (AJOVY ) 225 MG/1.5ML SOAJ

## 2024-10-15 NOTE — Telephone Encounter (Signed)
 Pharmacy Patient Advocate Encounter   Received notification from Physician's Office that prior authorization for Ajovy  is required/requested.   Insurance verification completed.   The patient is insured through [employer].   Per test claim: PA required; PA submitted to above mentioned insurance via Latent Key/confirmation #/EOC [id number] Status is pending

## 2024-10-18 ENCOUNTER — Other Ambulatory Visit: Payer: Self-pay

## 2024-10-18 MED ORDER — ROSUVASTATIN CALCIUM 20 MG PO TABS: 20.0000 mg | ORAL_TABLET | Freq: Every day | ORAL | 3 refills | Status: AC

## 2024-10-18 NOTE — Progress Notes (Signed)
 Order for rosuvastatin  20 mg daily sent to patient preferred pharmacy.

## 2024-10-27 ENCOUNTER — Encounter: Payer: Self-pay | Admitting: Student in an Organized Health Care Education/Training Program

## 2024-10-28 ENCOUNTER — Other Ambulatory Visit (HOSPITAL_COMMUNITY): Payer: Self-pay

## 2024-10-28 MED ORDER — VALSARTAN 160 MG PO TABS: 240.0000 mg | ORAL_TABLET | Freq: Every day | ORAL | 3 refills | Status: AC

## 2024-10-28 MED ORDER — OMRON 3 SERIES BP MONITOR DEVI
0 refills | Status: AC
  Filled 2024-10-28 – 2024-11-11 (×2): qty 1, 30d supply, fill #0

## 2024-11-04 ENCOUNTER — Telehealth (HOSPITAL_COMMUNITY): Payer: Self-pay | Admitting: *Deleted

## 2024-11-04 NOTE — Telephone Encounter (Signed)
 Left detailed instructions for stress test.

## 2024-11-09 ENCOUNTER — Other Ambulatory Visit (HOSPITAL_COMMUNITY): Payer: Self-pay

## 2024-11-09 ENCOUNTER — Other Ambulatory Visit: Payer: Self-pay | Admitting: Student in an Organized Health Care Education/Training Program

## 2024-11-09 DIAGNOSIS — R072 Precordial pain: Secondary | ICD-10-CM

## 2024-11-11 ENCOUNTER — Ambulatory Visit (HOSPITAL_COMMUNITY)
Admission: RE | Admit: 2024-11-11 | Discharge: 2024-11-11 | Disposition: A | Source: Ambulatory Visit | Attending: Student in an Organized Health Care Education/Training Program | Admitting: Student in an Organized Health Care Education/Training Program

## 2024-11-11 ENCOUNTER — Other Ambulatory Visit (HOSPITAL_COMMUNITY): Payer: Self-pay

## 2024-11-11 DIAGNOSIS — R072 Precordial pain: Secondary | ICD-10-CM

## 2024-11-11 LAB — MYOCARDIAL PERFUSION IMAGING
Angina Index: 0
Duke Treadmill Score: 6
Estimated workload: 7
Exercise duration (min): 6 min
Exercise duration (sec): 0 s
LV dias vol: 65 mL (ref 46–106)
LV sys vol: 7 mL
MPHR: 150 {beats}/min
Nuc Stress EF: 89 %
Peak HR: 151 {beats}/min
Percent HR: 100 %
Rest HR: 71 {beats}/min
Rest Nuclear Isotope Dose: 10.8 mCi
SDS: 0
SRS: 4
SSS: 1
ST Depression (mm): 0 mm
Stress Nuclear Isotope Dose: 32.9 mCi
TID: 0.97

## 2024-11-11 MED ORDER — TECHNETIUM TC 99M TETROFOSMIN IV KIT
32.9000 | PACK | Freq: Once | INTRAVENOUS | Status: AC | PRN
  Administered 2024-11-11: 32.9 via INTRAVENOUS

## 2024-11-11 MED ORDER — TECHNETIUM TC 99M TETROFOSMIN IV KIT
10.8000 | PACK | Freq: Once | INTRAVENOUS | Status: AC | PRN
  Administered 2024-11-11: 10.8 via INTRAVENOUS

## 2024-11-12 LAB — ECHOCARDIOGRAM COMPLETE
AR max vel: 2.22 cm2
AV Area VTI: 2.26 cm2
AV Area mean vel: 2.17 cm2
AV Mean grad: 11.6 mmHg
AV Peak grad: 21 mmHg
Ao pk vel: 2.29 m/s
Area-P 1/2: 2.36 cm2
MV M vel: 5.5 m/s
MV Peak grad: 121.1 mmHg
Radius: 0.55 cm
S' Lateral: 2.6 cm

## 2025-02-07 ENCOUNTER — Ambulatory Visit: Admitting: Adult Health
